# Patient Record
Sex: Female | Born: 1955 | Race: White | Hispanic: No | Marital: Married | Smoking: Current every day smoker
Health system: Southern US, Community
[De-identification: ages and names within clinical notes are randomized; demographics above are authoritative.]

## PROBLEM LIST (undated history)

## (undated) DIAGNOSIS — I350 Nonrheumatic aortic (valve) stenosis: Secondary | ICD-10-CM

## (undated) DIAGNOSIS — F329 Major depressive disorder, single episode, unspecified: Secondary | ICD-10-CM

## (undated) DIAGNOSIS — F32A Depression, unspecified: Secondary | ICD-10-CM

## (undated) DIAGNOSIS — N189 Chronic kidney disease, unspecified: Secondary | ICD-10-CM

## (undated) DIAGNOSIS — K9189 Other postprocedural complications and disorders of digestive system: Secondary | ICD-10-CM

## (undated) DIAGNOSIS — M1990 Unspecified osteoarthritis, unspecified site: Secondary | ICD-10-CM

## (undated) DIAGNOSIS — R011 Cardiac murmur, unspecified: Secondary | ICD-10-CM

## (undated) DIAGNOSIS — F988 Other specified behavioral and emotional disorders with onset usually occurring in childhood and adolescence: Secondary | ICD-10-CM

## (undated) DIAGNOSIS — F419 Anxiety disorder, unspecified: Secondary | ICD-10-CM

## (undated) DIAGNOSIS — T8859XA Other complications of anesthesia, initial encounter: Secondary | ICD-10-CM

## (undated) DIAGNOSIS — M810 Age-related osteoporosis without current pathological fracture: Secondary | ICD-10-CM

## (undated) DIAGNOSIS — D649 Anemia, unspecified: Secondary | ICD-10-CM

## (undated) DIAGNOSIS — G5600 Carpal tunnel syndrome, unspecified upper limb: Secondary | ICD-10-CM

## (undated) DIAGNOSIS — I10 Essential (primary) hypertension: Secondary | ICD-10-CM

## (undated) HISTORY — PX: TUBAL LIGATION: SHX77

## (undated) HISTORY — PX: APPENDECTOMY: SHX54

## (undated) HISTORY — PX: FRACTURE SURGERY: SHX138

## (undated) HISTORY — PX: LIGAMENT REPAIR: SHX5444

## (undated) HISTORY — PX: STRABISMUS SURGERY: SHX218

## (undated) HISTORY — PX: LAPAROSCOPIC CHOLECYSTECTOMY: SUR755

## (undated) HISTORY — PX: LAPAROSCOPIC OOPHERECTOMY: SHX6507

---

## 1999-04-09 HISTORY — PX: GASTRIC BYPASS: SHX52

## 2000-04-08 DIAGNOSIS — K9189 Other postprocedural complications and disorders of digestive system: Secondary | ICD-10-CM

## 2000-04-08 HISTORY — PX: ESOPHAGOGASTRODUODENOSCOPY (EGD) WITH ESOPHAGEAL DILATION: SHX5812

## 2000-04-08 HISTORY — DX: Other postprocedural complications and disorders of digestive system: K91.89

## 2000-12-12 ENCOUNTER — Encounter: Payer: Self-pay | Admitting: *Deleted

## 2000-12-12 ENCOUNTER — Encounter: Admission: RE | Admit: 2000-12-12 | Discharge: 2000-12-12 | Payer: Self-pay | Admitting: *Deleted

## 2001-01-02 ENCOUNTER — Ambulatory Visit (HOSPITAL_COMMUNITY): Admission: RE | Admit: 2001-01-02 | Discharge: 2001-01-02 | Payer: Self-pay | Admitting: Gastroenterology

## 2001-04-09 ENCOUNTER — Encounter: Payer: Self-pay | Admitting: *Deleted

## 2001-04-09 ENCOUNTER — Ambulatory Visit (HOSPITAL_COMMUNITY): Admission: RE | Admit: 2001-04-09 | Discharge: 2001-04-09 | Payer: Self-pay | Admitting: *Deleted

## 2001-10-23 ENCOUNTER — Encounter: Payer: Self-pay | Admitting: Emergency Medicine

## 2001-10-24 ENCOUNTER — Inpatient Hospital Stay (HOSPITAL_COMMUNITY): Admission: EM | Admit: 2001-10-24 | Discharge: 2001-10-24 | Payer: Self-pay | Admitting: Emergency Medicine

## 2001-11-06 ENCOUNTER — Encounter: Payer: Self-pay | Admitting: Gastroenterology

## 2001-11-06 ENCOUNTER — Encounter: Admission: RE | Admit: 2001-11-06 | Discharge: 2001-11-06 | Payer: Self-pay | Admitting: Gastroenterology

## 2001-12-03 ENCOUNTER — Encounter: Admission: RE | Admit: 2001-12-03 | Discharge: 2001-12-03 | Payer: Self-pay | Admitting: Family Medicine

## 2001-12-03 ENCOUNTER — Encounter: Payer: Self-pay | Admitting: Family Medicine

## 2002-09-07 ENCOUNTER — Encounter: Payer: Self-pay | Admitting: Specialist

## 2002-09-07 ENCOUNTER — Ambulatory Visit (HOSPITAL_COMMUNITY): Admission: RE | Admit: 2002-09-07 | Discharge: 2002-09-07 | Payer: Self-pay | Admitting: Specialist

## 2004-07-03 ENCOUNTER — Ambulatory Visit (HOSPITAL_COMMUNITY): Admission: RE | Admit: 2004-07-03 | Discharge: 2004-07-03 | Payer: Self-pay | Admitting: Family Medicine

## 2005-04-08 HISTORY — PX: ABDOMINAL HYSTERECTOMY: SHX81

## 2005-10-27 ENCOUNTER — Emergency Department (HOSPITAL_COMMUNITY): Admission: EM | Admit: 2005-10-27 | Discharge: 2005-10-27 | Payer: Self-pay | Admitting: Emergency Medicine

## 2005-12-25 ENCOUNTER — Encounter: Admission: RE | Admit: 2005-12-25 | Discharge: 2005-12-25 | Payer: Self-pay | Admitting: Family Medicine

## 2006-08-26 ENCOUNTER — Encounter: Admission: RE | Admit: 2006-08-26 | Discharge: 2006-08-26 | Payer: Self-pay | Admitting: Obstetrics and Gynecology

## 2008-05-12 ENCOUNTER — Other Ambulatory Visit: Admission: RE | Admit: 2008-05-12 | Discharge: 2008-05-12 | Payer: Self-pay | Admitting: Family Medicine

## 2008-06-16 ENCOUNTER — Encounter: Admission: RE | Admit: 2008-06-16 | Discharge: 2008-06-16 | Payer: Self-pay | Admitting: Family Medicine

## 2010-02-09 ENCOUNTER — Encounter: Admission: RE | Admit: 2010-02-09 | Discharge: 2010-02-09 | Payer: Self-pay | Admitting: Orthopedic Surgery

## 2010-02-21 ENCOUNTER — Encounter: Admission: RE | Admit: 2010-02-21 | Discharge: 2010-02-21 | Payer: Self-pay | Admitting: Orthopedic Surgery

## 2010-04-28 ENCOUNTER — Encounter: Payer: Self-pay | Admitting: Orthopedic Surgery

## 2010-08-24 NOTE — Procedures (Signed)
[place]  Patient:    Tiffany Mueller, Tiffany Mueller Visit Number: [id number] MRN: [id number]          Service Type: END Location: ENDO Attending Physician:  Orland Mustard Dictated by:   Llana Aliment. Randa Evens, M.D. Proc. Date: 01/02/01 Admit Date:  01/02/2001   CC:         Dr. Gladstone Pih  Dr. Adolphus Birchwood, [facility]   Procedure Report  PROCEDURE:  Esophagogastroduodenoscopy.  COLONOSCOPIST:  Llana Aliment. Randa Evens, M.D.  MEDICATIONS:  Hurricane spray, Fentanyl 50 mcg, and Versed 5 mg IV.  INDICATIONS:  The patient has had a gastric bypass procedure with a cholecystectomy and a Roux-en-Y for obesity and has lost nearly 100 pounds. Unfortunately since her surgery done on October 25 there has been vomiting. Upper GI showed a partial gastric bowel obstruction.  This procedure is done to evaluate that area.  DESCRIPTION OF PROCEDURE:  The procedure had been explained to the patient and consent obtained.  With the patient in the left lateral decubitus position, the Olympus scope was passed.  The distal esophagus was reached and the gastric pouch was entered.  There was some food material in the gastric pouch. The patient had been on liquids and he actually received some GoLYTELY type of material to try to loosen this up and there is only small amounts of solid material, mostly liquid, which is easily sucked out.  The gastric outlet was identified and easily passed with a scope.  It was slightly friable and a little bit swollen, but the endoscope easily passed with minimal pressure. The small intestine was seen for approximately 20 cm and was normal.  The scope was withdrawn and several passes were made through the gastric outlet. Scope was withdrawn and all food material and liquid in the stomach were suctioned.  She tolerated the procedure well and was awake and alert at the termination of procedure.  ASSESSMENT:  Slight swelling and friability to  gastric outlet, partial gastric outlet obstruction.  PLAN:  Will give a trial of Reglan and Carafate.  Will see her back in our office in six weeks.  If she has not improved, she may well need surgical intervention. Dictated by:   Llana Aliment. Randa Evens, M.D. Attending Physician:  Orland Mustard DD:  01/02/01 TD:  01/02/01 Job: [id number]

## 2012-10-07 ENCOUNTER — Other Ambulatory Visit: Payer: Self-pay | Admitting: Family Medicine

## 2012-10-07 DIAGNOSIS — M810 Age-related osteoporosis without current pathological fracture: Secondary | ICD-10-CM

## 2012-10-07 DIAGNOSIS — Z1231 Encounter for screening mammogram for malignant neoplasm of breast: Secondary | ICD-10-CM

## 2012-10-28 ENCOUNTER — Ambulatory Visit
Admission: RE | Admit: 2012-10-28 | Discharge: 2012-10-28 | Disposition: A | Discharge status: Home / Self Care | Payer: BC Managed Care – PPO | Source: Ambulatory Visit | Attending: Family Medicine | Admitting: Family Medicine

## 2012-10-28 DIAGNOSIS — M810 Age-related osteoporosis without current pathological fracture: Secondary | ICD-10-CM

## 2012-10-28 DIAGNOSIS — Z1231 Encounter for screening mammogram for malignant neoplasm of breast: Secondary | ICD-10-CM

## 2014-06-08 ENCOUNTER — Emergency Department (HOSPITAL_BASED_OUTPATIENT_CLINIC_OR_DEPARTMENT_OTHER)
Admission: EM | Admit: 2014-06-08 | Discharge: 2014-06-08 | Disposition: A | Discharge status: Home / Self Care | Payer: BLUE CROSS/BLUE SHIELD | Attending: Emergency Medicine | Admitting: Emergency Medicine

## 2014-06-08 ENCOUNTER — Emergency Department (HOSPITAL_BASED_OUTPATIENT_CLINIC_OR_DEPARTMENT_OTHER): Payer: BLUE CROSS/BLUE SHIELD

## 2014-06-08 ENCOUNTER — Encounter (HOSPITAL_BASED_OUTPATIENT_CLINIC_OR_DEPARTMENT_OTHER): Payer: Self-pay | Admitting: *Deleted

## 2014-06-08 DIAGNOSIS — M8580 Other specified disorders of bone density and structure, unspecified site: Secondary | ICD-10-CM | POA: Insufficient documentation

## 2014-06-08 DIAGNOSIS — M79641 Pain in right hand: Secondary | ICD-10-CM

## 2014-06-08 DIAGNOSIS — Z720 Tobacco use: Secondary | ICD-10-CM | POA: Diagnosis not present

## 2014-06-08 DIAGNOSIS — M19041 Primary osteoarthritis, right hand: Secondary | ICD-10-CM | POA: Diagnosis not present

## 2014-06-08 DIAGNOSIS — G5601 Carpal tunnel syndrome, right upper limb: Secondary | ICD-10-CM | POA: Diagnosis not present

## 2014-06-08 HISTORY — DX: Age-related osteoporosis without current pathological fracture: M81.0

## 2014-06-08 NOTE — ED Provider Notes (Signed)
CSN: [id number]     Arrival date & time 06/08/14  1347 History   First MD Initiated Contact with Patient 06/08/14 1440     Chief Complaint  Patient presents with  . Hand Pain     (Consider location/radiation/quality/duration/timing/severity/associated sxs/prior Treatment) HPI Comments: 59 year old female complaining of right hand pain 2 days. States she was gardening which started to get sharp, shooting pains through the top of her hand into her wrist with associated tingling into her fingers and up towards her elbow, worse with movement and use. No injury or trauma, just is constantly using her right hand. Reports a history of osteoporosis and is concerned that she may have a small fracture. States she had tendon surgery about 3 years ago in the right hand, and has many splints at home which provided no relief. Denies fever, chills, weakness.  Patient is a 59 y.o. female presenting with hand pain. The history is provided by the patient.  Hand Pain This is a recurrent problem. The current episode started yesterday. The problem occurs constantly. The problem has been gradually worsening. Associated symptoms comments: tingling.    Past Medical History  Diagnosis Date  . Osteoporosis    Past Surgical History  Procedure Laterality Date  . Wrist surgery    . Gastric bypass    . Appendectomy    . Eye surgery    . Abdominal hysterectomy     No family history on file. History  Substance Use Topics  . Smoking status: Current Every Day Smoker  . Smokeless tobacco: Not on file  . Alcohol Use: Yes   OB History    No data available     Review of Systems  Musculoskeletal:       + R hand pain.  All other systems reviewed and are negative.     Allergies  Review of patient's allergies indicates not on file.  Home Medications   Prior to Admission medications   Not on File   BP 170/70 mmHg  Pulse 88  Temp(Src) 98.1 F (36.7 C) (Oral)  Resp 16  Ht 5\' 2"  (1.575 m)  Wt 141 lb  (63.957 kg)  BMI 25.78 kg/m2  SpO2 100% Physical Exam  Constitutional: She is oriented to person, place, and time. She appears well-developed and well-nourished. No distress.  HENT:  Head: Normocephalic and atraumatic.  Mouth/Throat: Oropharynx is clear and moist.  Eyes: Conjunctivae and EOM are normal.  Neck: Normal range of motion. Neck supple.  Cardiovascular: Normal rate, regular rhythm and normal heart sounds.   Pulmonary/Chest: Effort normal and breath sounds normal. No respiratory distress.  Musculoskeletal: Normal range of motion. She exhibits no edema.  R hand TTP over 2-4 metacarpals. No deformity or swelling. TTP over dorsal aspect of carpal bones. No snuffbox tenderness. FROM wrist and hand, pain into hand with flexion.  Neurological: She is alert and oriented to person, place, and time. No sensory deficit.  Grip strength 5/5 and equal BL. Positive Tinel's and Phalen's on right.  Skin: Skin is warm and dry.  Psychiatric: She has a normal mood and affect. Her behavior is normal.  Nursing note and vitals reviewed.   ED Course  Procedures (including critical care time) Labs Review Labs Reviewed - No data to display  Imaging Review Dg Hand Complete Right  06/08/2014   CLINICAL DATA:  Pain in the proximal aspect of the right hand for a few days. No known injury. Initial encounter.  EXAM: RIGHT HAND - COMPLETE 3+ VIEW  COMPARISON:  MRI of the right wrist 02/21/2010.  FINDINGS: No acute bony or joint abnormality is identified. Cystic change in the ulnar aspect of the lunate in this patient with mild ulnar positive variance is compatible with ulnocarpal abutment. Joint space narrowing and osteophytosis are most notable at the DIP joints of the index and long fingers. No erosion is identified. Bones appear somewhat osteopenic.  IMPRESSION: No acute abnormality.  Findings compatible with ulnocarpal abutment.  Osteopenia.  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    Electronically Signed   By: Drusilla Kanner M.D.   On: 06/08/2014 15:24     EKG Interpretation None      MDM   Final diagnoses:  Right hand pain  Osteopenia  Osteoarthritis of right hand, unspecified osteoarthritis type  Carpal tunnel syndrome of right wrist   NAD. Neurovascularly intact. No acute findings on x-ray. Most consistent with carpal tunnel. Advised her to continue to use her wrist splints along with taking NSAIDs. Follow-up with orthopedics. She has seen Dr. Mina Marble in the past for her hand who did her tendon surgery, however she does not wish to return to his office. Stable for discharge. Return precautions given. Patient states understanding of treatment care plan and is agreeable.   Kathrynn Speed, PA-C 06/08/14 1535  Arby Barrette, MD 06/09/14 [phone]

## 2014-06-08 NOTE — ED Notes (Signed)
Pt reports pain in the back of her hand that goes into her wrist with some tingling since yesterday when she was working in the garden.

## 2014-06-08 NOTE — Discharge Instructions (Signed)
Use your wrist splints, and take ibuprofen, aleve or tylenol for pain.  Osteoarthritis Osteoarthritis is a disease that causes soreness and inflammation of a joint. It occurs when the cartilage at the affected joint wears down. Cartilage acts as a cushion, covering the ends of bones where they meet to form a joint. Osteoarthritis is the most common form of arthritis. It often occurs in older people. The joints affected most often by this condition include those in the:  Ends of the fingers.  Thumbs.  Neck.  Lower back.  Knees.  Hips. CAUSES  Over time, the cartilage that covers the ends of bones begins to wear away. This causes bone to rub on bone, producing pain and stiffness in the affected joints.  RISK FACTORS Certain factors can increase your chances of having osteoarthritis, including:  Older age.  Excessive body weight.  Overuse of joints.  Previous joint injury. SIGNS AND SYMPTOMS   Pain, swelling, and stiffness in the joint.  Over time, the joint may lose its normal shape.  Small deposits of bone (osteophytes) may grow on the edges of the joint.  Bits of bone or cartilage can break off and float inside the joint space. This may cause more pain and damage. DIAGNOSIS  Your health care provider will do a physical exam and ask about your symptoms. Various tests may be ordered, such as:  X-rays of the affected joint.  An MRI scan.  Blood tests to rule out other types of arthritis.  Joint fluid tests. This involves using a needle to draw fluid from the joint and examining the fluid under a microscope. TREATMENT  Goals of treatment are to control pain and improve joint function. Treatment plans may include:  A prescribed exercise program that allows for rest and joint relief.  A weight control plan.  Pain relief techniques, such as:  Properly applied heat and cold.  Electric pulses delivered to nerve endings under the skin (transcutaneous electrical nerve  stimulation [TENS]).  Massage.  Certain nutritional supplements.  Medicines to control pain, such as:  Acetaminophen.  Nonsteroidal anti-inflammatory drugs (NSAIDs), such as naproxen.  Narcotic or central-acting agents, such as tramadol.  Corticosteroids. These can be given orally or as an injection.  Surgery to reposition the bones and relieve pain (osteotomy) or to remove loose pieces of bone and cartilage. Joint replacement may be needed in advanced states of osteoarthritis. HOME CARE INSTRUCTIONS   Take medicines only as directed by your health care provider.  Maintain a healthy weight. Follow your health care provider's instructions for weight control. This may include dietary instructions.  Exercise as directed. Your health care provider can recommend specific types of exercise. These may include:  Strengthening exercises. These are done to strengthen the muscles that support joints affected by arthritis. They can be performed with weights or with exercise bands to add resistance.  Aerobic activities. These are exercises, such as brisk walking or low-impact aerobics, that get your heart pumping.  Range-of-motion activities. These keep your joints limber.  Balance and agility exercises. These help you maintain daily living skills.  Rest your affected joints as directed by your health care provider.  Keep all follow-up visits as directed by your health care provider. SEEK MEDICAL CARE IF:   Your skin turns red.  You develop a rash in addition to your joint pain.  You have worsening joint pain.  You have a fever along with joint or muscle aches. SEEK IMMEDIATE MEDICAL CARE IF:  You have a  significant loss of weight or appetite.  You have night sweats. FOR MORE INFORMATION   National Institute of Arthritis and Musculoskeletal and Skin Diseases: www.niams.http://www.myers.net/  [employer] on Aging: https://walker.com/  American College of Rheumatology:  www.rheumatology.org Document Released: 03/25/2005 Document Revised: 08/09/2013 Document Reviewed: 11/30/2012 [facility] Patient Information 2015 [place], [place]. This information is not intended to replace advice given to you by your health care provider. Make sure you discuss any questions you have with your health care provider.  Carpal Tunnel Syndrome The carpal tunnel is a narrow area located on the palm side of your wrist. The tunnel is formed by the wrist bones and ligaments. Nerves, blood vessels, and tendons pass through the carpal tunnel. Repeated wrist motion or certain diseases may cause swelling within the tunnel. This swelling pinches the main nerve in the wrist (median nerve) and causes the painful hand and arm condition called carpal tunnel syndrome. CAUSES   Repeated wrist motions.  Wrist injuries.  Certain diseases like arthritis, diabetes, alcoholism, hyperthyroidism, and kidney failure.  Obesity.  Pregnancy. SYMPTOMS   A "pins and needles" feeling in your fingers or hand, especially in your thumb, index and middle fingers.  Tingling or numbness in your fingers or hand.  An aching feeling in your entire arm, especially when your wrist and elbow are bent for long periods of time.  Wrist pain that goes up your arm to your shoulder.  Pain that goes down into your palm or fingers.  A weak feeling in your hands. DIAGNOSIS  Your health care provider will take your history and perform a physical exam. An electromyography test may be needed. This test measures electrical signals sent out by your nerves into the muscles. The electrical signals are usually slowed by carpal tunnel syndrome. You may also need X-rays. TREATMENT  Carpal tunnel syndrome may clear up by itself. Your health care provider may recommend a wrist splint or medicine such as a nonsteroidal anti-inflammatory medicine. Cortisone injections may help. Sometimes, surgery may be needed to free the pinched nerve.   HOME CARE INSTRUCTIONS   Take all medicine as directed by your health care provider. Only take over-the-counter or prescription medicines for pain, discomfort, or fever as directed by your health care provider.  If you were given a splint to keep your wrist from bending, wear it as directed. It is important to wear the splint at night. Wear the splint for as long as you have pain or numbness in your hand, arm, or wrist. This may take 1 to 2 months.  Rest your wrist from any activity that may be causing your pain. If your symptoms are work-related, you may need to talk to your employer about changing to a job that does not require using your wrist.  Put ice on your wrist after long periods of wrist activity.  Put ice in a plastic bag.  Place a towel between your skin and the bag.  Leave the ice on for 15-20 minutes, 03-04 times a day.  Keep all follow-up visits as directed by your health care provider. This includes any orthopedic referrals, physical therapy, and rehabilitation. Any delay in getting necessary care could result in a delay or failure of your condition to heal. SEEK IMMEDIATE MEDICAL CARE IF:   You have new, unexplained symptoms.  Your symptoms get worse and are not helped or controlled with medicines. MAKE SURE YOU:   Understand these instructions.  Will watch your condition.  Will get help right away if you  are not doing well or get worse. Document Released: 03/22/2000 Document Revised: 08/09/2013 Document Reviewed: 02/08/2011 [facility] Patient Information 2015 [place]. This information is not intended to replace advice given to you by your health care provider. Make sure you discuss any questions you have with your health care provider.

## 2014-06-08 NOTE — ED Notes (Signed)
PA at bedside.

## 2014-11-08 ENCOUNTER — Emergency Department (HOSPITAL_COMMUNITY): Payer: BLUE CROSS/BLUE SHIELD

## 2014-11-08 ENCOUNTER — Encounter (HOSPITAL_COMMUNITY): Payer: Self-pay | Admitting: Emergency Medicine

## 2014-11-08 ENCOUNTER — Inpatient Hospital Stay (HOSPITAL_COMMUNITY)
Admission: EM | Admit: 2014-11-08 | Discharge: 2014-11-12 | DRG: 392 | Disposition: A | Discharge status: Home / Self Care | Payer: BLUE CROSS/BLUE SHIELD | Attending: Family Medicine | Admitting: Family Medicine

## 2014-11-08 DIAGNOSIS — K529 Noninfective gastroenteritis and colitis, unspecified: Principal | ICD-10-CM | POA: Diagnosis present

## 2014-11-08 DIAGNOSIS — Z5320 Procedure and treatment not carried out because of patient's decision for unspecified reasons: Secondary | ICD-10-CM

## 2014-11-08 DIAGNOSIS — Z90710 Acquired absence of both cervix and uterus: Secondary | ICD-10-CM

## 2014-11-08 DIAGNOSIS — F17210 Nicotine dependence, cigarettes, uncomplicated: Secondary | ICD-10-CM | POA: Diagnosis present

## 2014-11-08 DIAGNOSIS — I10 Essential (primary) hypertension: Secondary | ICD-10-CM

## 2014-11-08 DIAGNOSIS — R109 Unspecified abdominal pain: Secondary | ICD-10-CM | POA: Diagnosis not present

## 2014-11-08 DIAGNOSIS — Z9049 Acquired absence of other specified parts of digestive tract: Secondary | ICD-10-CM | POA: Diagnosis present

## 2014-11-08 DIAGNOSIS — F329 Major depressive disorder, single episode, unspecified: Secondary | ICD-10-CM | POA: Diagnosis present

## 2014-11-08 DIAGNOSIS — K56609 Unspecified intestinal obstruction, unspecified as to partial versus complete obstruction: Secondary | ICD-10-CM

## 2014-11-08 DIAGNOSIS — M810 Age-related osteoporosis without current pathological fracture: Secondary | ICD-10-CM | POA: Diagnosis present

## 2014-11-08 DIAGNOSIS — Z9884 Bariatric surgery status: Secondary | ICD-10-CM

## 2014-11-08 HISTORY — DX: Other postprocedural complications and disorders of digestive system: K91.89

## 2014-11-08 HISTORY — DX: Depression, unspecified: F32.A

## 2014-11-08 HISTORY — DX: Major depressive disorder, single episode, unspecified: F32.9

## 2014-11-08 HISTORY — DX: Essential (primary) hypertension: I10

## 2014-11-08 HISTORY — DX: Other specified behavioral and emotional disorders with onset usually occurring in childhood and adolescence: F98.8

## 2014-11-08 LAB — COMPREHENSIVE METABOLIC PANEL
ALT: 16 U/L (ref 14–54)
AST: 20 U/L (ref 15–41)
Albumin: 4.5 g/dL (ref 3.5–5.0)
Alkaline Phosphatase: 97 U/L (ref 38–126)
Anion gap: 8 (ref 5–15)
BUN: 11 mg/dL (ref 6–20)
CALCIUM: 9.3 mg/dL (ref 8.9–10.3)
CO2: 25 mmol/L (ref 22–32)
CREATININE: 1 mg/dL (ref 0.44–1.00)
Chloride: 104 mmol/L (ref 101–111)
GFR calc Af Amer: >60 mL/min (ref >60)
GFR calc non Af Amer: 60 mL/min — ABNORMAL LOW (ref >60)
GLUCOSE: 114 mg/dL — AB (ref 65–99)
Potassium: 4 mmol/L (ref 3.5–5.1)
Sodium: 137 mmol/L (ref 135–145)
TOTAL PROTEIN: 7.5 g/dL (ref 6.5–8.1)
Total Bilirubin: 1.2 mg/dL (ref 0.3–1.2)

## 2014-11-08 LAB — CBC WITH DIFFERENTIAL/PLATELET
BASOS PCT: 0 % (ref 0–1)
Basophils Absolute: 0 10*3/uL (ref 0.0–0.1)
EOS ABS: 0.1 10*3/uL (ref 0.0–0.7)
Eosinophils Relative: 1 % (ref 0–5)
HCT: 35.8 % — ABNORMAL LOW (ref 36.0–46.0)
HEMOGLOBIN: 10.7 g/dL — AB (ref 12.0–15.0)
LYMPHS ABS: 0.8 10*3/uL (ref 0.7–4.0)
Lymphocytes Relative: 9 % — ABNORMAL LOW (ref 12–46)
MCH: 23.9 pg — ABNORMAL LOW (ref 26.0–34.0)
MCHC: 29.9 g/dL — ABNORMAL LOW (ref 30.0–36.0)
MCV: 79.9 fL (ref 78.0–100.0)
MONO ABS: 0.5 10*3/uL (ref 0.1–1.0)
Monocytes Relative: 5 % (ref 3–12)
Neutro Abs: 7.6 10*3/uL (ref 1.7–7.7)
Neutrophils Relative %: 85 % — ABNORMAL HIGH (ref 43–77)
Platelets: 378 10*3/uL (ref 150–400)
RBC: 4.48 MIL/uL (ref 3.87–5.11)
RDW: 16.5 % — ABNORMAL HIGH (ref 11.5–15.5)
WBC: 9 10*3/uL (ref 4.0–10.5)

## 2014-11-08 LAB — LIPASE, BLOOD: Lipase: 14 U/L — ABNORMAL LOW (ref 22–51)

## 2014-11-08 LAB — I-STAT CG4 LACTIC ACID, ED: LACTIC ACID, VENOUS: 0.51 mmol/L (ref 0.5–2.0)

## 2014-11-08 MED ORDER — HYDROMORPHONE HCL 1 MG/ML IJ SOLN
1.0000 mg | INTRAMUSCULAR | Status: DC | PRN
  Administered 2014-11-08 – 2014-11-10 (×4): 1 mg via INTRAVENOUS
  Filled 2014-11-08 (×4): qty 1

## 2014-11-08 MED ORDER — LACTATED RINGERS IV BOLUS (SEPSIS): 1000.0000 mL | Freq: Once | INTRAVENOUS | Status: DC

## 2014-11-08 MED ORDER — ONDANSETRON HCL 40 MG/20ML IJ SOLN
8.0000 mg | Freq: Four times a day (QID) | INTRAMUSCULAR | Status: DC | PRN
  Filled 2014-11-08: qty 4

## 2014-11-08 MED ORDER — AMLODIPINE BESYLATE 5 MG PO TABS
5.0000 mg | ORAL_TABLET | Freq: Every day | ORAL | Status: DC
  Administered 2014-11-08 – 2014-11-12 (×5): 5 mg via ORAL
  Filled 2014-11-08 (×5): qty 1

## 2014-11-08 MED ORDER — ONDANSETRON HCL 4 MG/2ML IJ SOLN
4.0000 mg | Freq: Once | INTRAMUSCULAR | Status: AC
  Administered 2014-11-08: 4 mg via INTRAVENOUS
  Filled 2014-11-08: qty 2

## 2014-11-08 MED ORDER — ENOXAPARIN SODIUM 40 MG/0.4ML ~~LOC~~ SOLN
40.0000 mg | SUBCUTANEOUS | Status: DC
  Administered 2014-11-08 – 2014-11-11 (×4): 40 mg via SUBCUTANEOUS
  Filled 2014-11-08 (×4): qty 0.4

## 2014-11-08 MED ORDER — LIP MEDEX EX OINT
1.0000 "application " | TOPICAL_OINTMENT | Freq: Two times a day (BID) | CUTANEOUS | Status: DC
  Administered 2014-11-08 – 2014-11-12 (×8): 1 via TOPICAL
  Filled 2014-11-08: qty 7

## 2014-11-08 MED ORDER — ONDANSETRON HCL 4 MG/2ML IJ SOLN: 4.0000 mg | Freq: Three times a day (TID) | INTRAMUSCULAR | Status: DC | PRN

## 2014-11-08 MED ORDER — BISACODYL 10 MG RE SUPP: 10.0000 mg | Freq: Two times a day (BID) | RECTAL | Status: DC | PRN

## 2014-11-08 MED ORDER — ACETAMINOPHEN 650 MG RE SUPP: 650.0000 mg | Freq: Four times a day (QID) | RECTAL | Status: DC | PRN

## 2014-11-08 MED ORDER — VENLAFAXINE HCL ER 150 MG PO CP24
150.0000 mg | ORAL_CAPSULE | Freq: Every day | ORAL | Status: DC
Start: 2014-11-09 — End: 2014-11-12
  Administered 2014-11-09 – 2014-11-12 (×4): 150 mg via ORAL
  Filled 2014-11-08 (×4): qty 1

## 2014-11-08 MED ORDER — BUPROPION HCL ER (XL) 150 MG PO TB24
150.0000 mg | ORAL_TABLET | Freq: Every day | ORAL | Status: DC
  Administered 2014-11-08 – 2014-11-12 (×5): 150 mg via ORAL
  Filled 2014-11-08 (×5): qty 1

## 2014-11-08 MED ORDER — SODIUM CHLORIDE 0.9 % IJ SOLN
3.0000 mL | Freq: Two times a day (BID) | INTRAMUSCULAR | Status: DC
  Administered 2014-11-08 – 2014-11-12 (×6): 3 mL via INTRAVENOUS

## 2014-11-08 MED ORDER — SACCHAROMYCES BOULARDII 250 MG PO CAPS
250.0000 mg | ORAL_CAPSULE | Freq: Two times a day (BID) | ORAL | Status: DC
  Administered 2014-11-08 – 2014-11-12 (×8): 250 mg via ORAL
  Filled 2014-11-08 (×8): qty 1

## 2014-11-08 MED ORDER — PHENOL 1.4 % MT LIQD
2.0000 | OROMUCOSAL | Status: DC | PRN
  Filled 2014-11-08: qty 177

## 2014-11-08 MED ORDER — IOHEXOL 300 MG/ML  SOLN
25.0000 mL | Freq: Once | INTRAMUSCULAR | Status: AC | PRN
  Administered 2014-11-08: 25 mL via ORAL

## 2014-11-08 MED ORDER — METRONIDAZOLE IN NACL 5-0.79 MG/ML-% IV SOLN
500.0000 mg | Freq: Three times a day (TID) | INTRAVENOUS | Status: DC
  Administered 2014-11-08 – 2014-11-11 (×8): 500 mg via INTRAVENOUS
  Filled 2014-11-08 (×8): qty 100

## 2014-11-08 MED ORDER — MORPHINE SULFATE 4 MG/ML IJ SOLN
4.0000 mg | Freq: Once | INTRAMUSCULAR | Status: AC
  Administered 2014-11-08: 4 mg via INTRAVENOUS
  Filled 2014-11-08: qty 1

## 2014-11-08 MED ORDER — METOPROLOL TARTRATE 1 MG/ML IV SOLN: 5.0000 mg | Freq: Four times a day (QID) | INTRAVENOUS | Status: DC | PRN

## 2014-11-08 MED ORDER — SODIUM CHLORIDE 0.9 % IV SOLN
INTRAVENOUS | Status: AC
  Administered 2014-11-08 – 2014-11-09 (×2): via INTRAVENOUS

## 2014-11-08 MED ORDER — MENTHOL 3 MG MT LOZG
1.0000 | LOZENGE | OROMUCOSAL | Status: DC | PRN
  Filled 2014-11-08: qty 9

## 2014-11-08 MED ORDER — IOHEXOL 300 MG/ML  SOLN
100.0000 mL | Freq: Once | INTRAMUSCULAR | Status: AC | PRN
  Administered 2014-11-08: 100 mL via INTRAVENOUS

## 2014-11-08 MED ORDER — LACTATED RINGERS IV BOLUS (SEPSIS): 1000.0000 mL | Freq: Three times a day (TID) | INTRAVENOUS | Status: DC | PRN

## 2014-11-08 MED ORDER — MAGIC MOUTHWASH
15.0000 mL | Freq: Four times a day (QID) | ORAL | Status: DC | PRN
  Filled 2014-11-08: qty 15

## 2014-11-08 MED ORDER — FUROSEMIDE 40 MG PO TABS
40.0000 mg | ORAL_TABLET | Freq: Two times a day (BID) | ORAL | Status: DC
  Administered 2014-11-09 – 2014-11-12 (×6): 40 mg via ORAL
  Filled 2014-11-08 (×7): qty 1

## 2014-11-08 MED ORDER — PROMETHAZINE HCL 25 MG/ML IJ SOLN
6.2500 mg | INTRAMUSCULAR | Status: DC | PRN
  Filled 2014-11-08: qty 1

## 2014-11-08 MED ORDER — DIPHENHYDRAMINE HCL 50 MG/ML IJ SOLN: 12.5000 mg | Freq: Four times a day (QID) | INTRAMUSCULAR | Status: DC | PRN

## 2014-11-08 MED ORDER — ALUM & MAG HYDROXIDE-SIMETH 200-200-20 MG/5ML PO SUSP: 30.0000 mL | Freq: Four times a day (QID) | ORAL | Status: DC | PRN

## 2014-11-08 MED ORDER — LACTATED RINGERS IV BOLUS (SEPSIS)
2000.0000 mL | Freq: Once | INTRAVENOUS | Status: AC
  Administered 2014-11-08: 2000 mL via INTRAVENOUS

## 2014-11-08 MED ORDER — CIPROFLOXACIN IN D5W 400 MG/200ML IV SOLN
400.0000 mg | Freq: Two times a day (BID) | INTRAVENOUS | Status: DC
  Administered 2014-11-08 – 2014-11-10 (×5): 400 mg via INTRAVENOUS
  Filled 2014-11-08 (×6): qty 200

## 2014-11-08 MED ORDER — HYDRALAZINE HCL 20 MG/ML IJ SOLN: 10.0000 mg | Freq: Four times a day (QID) | INTRAMUSCULAR | Status: DC | PRN

## 2014-11-08 MED ORDER — ONDANSETRON HCL 4 MG/2ML IJ SOLN
4.0000 mg | Freq: Four times a day (QID) | INTRAMUSCULAR | Status: DC | PRN
Start: 2014-11-08 — End: 2014-11-12

## 2014-11-08 MED ORDER — SODIUM CHLORIDE 0.9 % IV SOLN: INTRAVENOUS | Status: AC

## 2014-11-08 MED ORDER — ACETAMINOPHEN 325 MG PO TABS
650.0000 mg | ORAL_TABLET | Freq: Four times a day (QID) | ORAL | Status: DC | PRN
  Administered 2014-11-10: 650 mg via ORAL
  Filled 2014-11-08: qty 2

## 2014-11-08 MED ORDER — KCL IN DEXTROSE-NACL 40-5-0.45 MEQ/L-%-% IV SOLN
INTRAVENOUS | Status: DC
  Filled 2014-11-08 (×2): qty 1000

## 2014-11-08 NOTE — ED Notes (Signed)
Spoke to [place] pt can go at 19:30

## 2014-11-08 NOTE — ED Provider Notes (Signed)
CSN: [id number]     Arrival date & time 11/08/14  1512 History   First MD Initiated Contact with Patient 11/08/14 1522     Chief Complaint  Patient presents with  . Abdominal Pain  . Bloated     (Consider location/radiation/quality/duration/timing/severity/associated sxs/prior Treatment) HPI Tiffany Mueller is a 59 y.o. female with history of hypertension presents to emergency department complaining of abdominal pain, nausea, vomiting. Patient states her symptoms started 7 PM last night. States pain is mainly in the right lower quadrant but radiates around the entire abdomen. She reports approximately 4-5 episodes of emesis. She states she had a normal bowel movement this morning. She denies any blood in her stool or emesis. She denies any black or tarry stools. She denies constipation. She states her abdomen feels bloated. She has been able to keep water down but has not eaten. Patient reports history of gastric bypass, surgery for "scar tissue and abdomen," appendectomy. She took tums prior to coming in today.  She denies any fever or chills. Denies any urinary symptoms. She has history of hysterectomy, states they took out one of the ovaries and uterus, states they "could not find my second ovary, floating somewhere and abdomen."  Pt states nothing making her symptoms better or worse. Denies similar pain in the past.  Past Medical History  Diagnosis Date  . Osteoporosis   . Hypertension   . Depression   . ADD (attention deficit disorder)    Past Surgical History  Procedure Laterality Date  . Wrist surgery    . Gastric bypass    . Appendectomy    . Eye surgery    . Abdominal hysterectomy     Family History  Problem Relation Age of Onset  . Alzheimer's disease Mother   . Atrial fibrillation Father    History  Substance Use Topics  . Smoking status: Current Every Day Smoker -- 0.50 packs/day    Types: Cigarettes  . Smokeless tobacco: Not on file  . Alcohol Use: Yes     Comment:  1 glass wine nightly   OB History    No data available     Review of Systems  Constitutional: Negative for fever and chills.  Respiratory: Negative for cough, chest tightness and shortness of breath.   Cardiovascular: Negative for chest pain, palpitations and leg swelling.  Gastrointestinal: Positive for nausea, vomiting, abdominal pain and abdominal distention. Negative for diarrhea, constipation and blood in stool.  Genitourinary: Negative for dysuria, flank pain, vaginal bleeding, vaginal discharge, vaginal pain and pelvic pain.  Musculoskeletal: Negative for myalgias, arthralgias, neck pain and neck stiffness.  Skin: Negative for rash.  Neurological: Negative for dizziness, weakness and headaches.  All other systems reviewed and are negative.     Allergies  Review of patient's allergies indicates no known allergies.  Home Medications   Prior to Admission medications   Medication Sig Start Date End Date Taking? Authorizing Provider  amLODipine (NORVASC) 5 MG tablet Take 5 mg by mouth daily.   Yes Historical Provider, MD  amphetamine-dextroamphetamine (ADDERALL) 30 MG tablet Take 30 mg by mouth 2 (two) times daily.   Yes Historical Provider, MD  buPROPion (WELLBUTRIN XL) 150 MG 24 hr tablet TAKE 1 TABLET IN THE MORNING ONCE A DAY ORALLY 30 DAY(S) 10/26/14  Yes Historical Provider, MD  furosemide (LASIX) 40 MG tablet Take 40 mg by mouth 2 (two) times daily.   Yes Historical Provider, MD  naproxen sodium (ANAPROX) 220 MG tablet Take 440 mg  by mouth daily as needed (pain).   Yes Historical Provider, MD  venlafaxine XR (EFFEXOR-XR) 150 MG 24 hr capsule Take 150 mg by mouth daily with breakfast.   Yes Historical Provider, MD   BP 202/83 mmHg  Pulse 63  Temp(Src) 98 F (36.7 C) (Oral)  Resp 16  SpO2 100% Physical Exam  Constitutional: She is oriented to person, place, and time. She appears well-developed and well-nourished.  Appears to be in pain  HENT:  Head: Normocephalic.   Eyes: Conjunctivae are normal.  Neck: Neck supple.  Cardiovascular: Normal rate, regular rhythm and normal heart sounds.   Pulmonary/Chest: Effort normal and breath sounds normal. No respiratory distress. She has no wheezes. She has no rales.  Abdominal: Soft. Bowel sounds are normal. She exhibits no distension. There is tenderness. There is guarding. There is no rebound.  Diffuse tenderness, worse in RLQ  Musculoskeletal: She exhibits no edema.  Neurological: She is alert and oriented to person, place, and time.  Skin: Skin is warm and dry.  Psychiatric: She has a normal mood and affect. Her behavior is normal.  Nursing note and vitals reviewed.   ED Course  Procedures (including critical care time) Labs Review Labs Reviewed  CBC WITH DIFFERENTIAL/PLATELET - Abnormal; Notable for the following:    Hemoglobin 10.7 (*)    HCT 35.8 (*)    MCH 23.9 (*)    MCHC 29.9 (*)    RDW 16.5 (*)    Neutrophils Relative % 85 (*)    Lymphocytes Relative 9 (*)    All other components within normal limits  COMPREHENSIVE METABOLIC PANEL - Abnormal; Notable for the following:    Glucose, Bld 114 (*)    GFR calc non Af Amer 60 (*)    All other components within normal limits  LIPASE, BLOOD - Abnormal; Notable for the following:    Lipase 14 (*)    All other components within normal limits  URINALYSIS, ROUTINE W REFLEX MICROSCOPIC (NOT AT [facility])  COMPREHENSIVE METABOLIC PANEL  CBC  I-STAT CG4 LACTIC ACID, ED  I-STAT CG4 LACTIC ACID, ED    Imaging Review Ct Abdomen Pelvis W Contrast  11/08/2014   CLINICAL DATA:  Acute onset of lower abdominal pain, nausea and vomiting. Initial encounter.  EXAM: CT ABDOMEN AND PELVIS WITH CONTRAST  TECHNIQUE: Multidetector CT imaging of the abdomen and pelvis was performed using the standard protocol following bolus administration of intravenous contrast.  CONTRAST:  OMNIPAQUE IOHEXOL 300 MG/ML  SOLN  COMPARISON:  None.  FINDINGS: The visualized lung bases  are clear.  Trace ascites is noted surrounding the liver and spleen, and tracking along the paracolic gutters into the pelvis. The etiology of this fluid is unclear, but may be related to the small bowel process described below.  Diffuse prominence of the intrahepatic biliary ducts, and dilatation of the common bile duct to 1.9 cm, reflects prior cholecystectomy. This may simply reflect the patient's baseline status post cholecystectomy, though would correlate for any evidence of post-cholecystectomy syndrome.  The liver and spleen are otherwise unremarkable. The pancreas and adrenal glands are within normal limits.  Apparent parenchymal striations within both kidneys are nonspecific and may reflect mild scarring, though would correlate clinically for any evidence of mild pyelonephritis. There is no evidence of hydronephrosis. No renal or ureteral stones are seen. No perinephric stranding is appreciated.  There is distention of small-bowel loops to 4.1 cm in maximal diameter, with underlying postoperative change. There is mild fecalization of the  distal ileum, though it remains borderline normal in caliber. Wall thickening is noted at the terminal ileum, with mild soft tissue inflammation about the ileocecal junction. This may reflect infectious or inflammatory ileitis with small bowel dysmotility, or possibly partial obstruction due to underlying adhesion, given the patient's prior surgery.  The stomach is within normal limits. No acute vascular abnormalities are seen. Mild scattered calcification is noted along the abdominal aorta and its branches.  The appendix is not definitely seen. There is no evidence for appendicitis. The colon is partially filled with stool and is unremarkable in appearance, aside from the mild apparent soft tissue inflammation about the ileocecal junction and cecum.  The bladder is mildly distended and grossly unremarkable. The patient is status post hysterectomy. A 2.3 cm cyst is noted at  the left side of the vaginal cuff. No suspicious adnexal masses are seen. No inguinal lymphadenopathy is seen.  No acute osseous abnormalities are identified. Mild facet disease is noted at the lower lumbar spine.  IMPRESSION: 1. Distention of small-bowel loops to 4.1 cm in maximal diameter, with underlying postoperative change. Mild fecalization of the distal ileum, with wall thickening at the terminal ileum and mild soft tissue inflammation about the ileocecal junction and cecum. This may reflect an infectious or inflammatory ileitis, with small bowel dysmotility, or possibly partial obstruction due to an underlying adhesion, given the patient's prior surgery. 2. Trace ascites noted within the abdomen and pelvis. 3. Diffuse prominence of the intrahepatic biliary ducts, and dilatation of the common bile duct to 1.9 cm, reflecting prior cholecystectomy. This may simply reflect the patient's baseline status post cholecystectomy, though would correlate clinically for any evidence of post-cholecystectomy syndrome. 4. Apparent parenchymal striations within both kidneys are nonspecific and may reflect mild scarring, though would correlate clinically for any evidence of mild pyelonephritis. 5. Mild scattered calcification along the abdominal aorta and its branches. 6. 2.3 cm cyst noted at the left side of the vaginal cuff.   Electronically Signed   By: Roanna Raider M.D.   On: 11/08/2014 18:10   Dg Abd 2 Views  11/08/2014   CLINICAL DATA:  Abdominal pain and nausea for 2 days. History of gastric bypass surgery.  EXAM: ABDOMEN - 2 VIEW  COMPARISON:  None.  FINDINGS: There is some scattered air and stool in the transverse colon. There are slightly dilated small bowel loops with air-fluid levels in the mid to lower abdomen and left lower quadrant suspicious for early small bowel obstruction. No free air. The soft tissue shadows are maintained. Extensive surgical changes. The lung bases are clear. The bony structures are  intact.  IMPRESSION: Plain film findings suggest an early or partial small bowel obstruction.   Electronically Signed   By: Rudie Meyer M.D.   On: 11/08/2014 16:33     EKG Interpretation None      MDM   Final diagnoses:  Abdominal pain  Ileitis   Pt with prior gastric bypass, appendectomy, hysterectomy, here with diffuse abdominal pain, worse in RLQ, nausea, vomiting. Will start meds, labs, xray. Concerning for SBO given distention, pain, vomiting, however pt has normal BS and normal bowel movement today. Will monitor.   Plain x-rays concerning for small bowel obstruction. CT scan ordered.  Patient is a labs are unremarkable except for hemoglobin of 10.7, most likely chronic. CT scan showing distention of the small bowel loops, with wall thickening in the terminal ileum and ileocecal junction and cecum. This is concerning for ileitis. However possible partial  obstruction was not completely ruled out with the scan. I discussed results with Dr. Michaell Cowing, who advised me to admit patient to medicine. Patient has never had colitis or any similar pain in the past. She denies ever having colonoscopy. Will hold off on antibiotics, this time no elevation of white blood cell count. Patient is afebrile. Will discuss with medicine.  I spoke with the [facility] hospitalist, they will admit. I did tell him I did not start antibiotics.   Filed Vitals:   11/08/14 1516 11/08/14 1922 11/08/14 1948  BP: 202/83 162/59 180/73  Pulse: 63 68 67  Temp: 98 F (36.7 C) 98.5 F (36.9 C) 98.9 F (37.2 C)  TempSrc: Oral Oral Oral  Resp: 16 18 18   Height:   5\' 2"  (1.575 m)  Weight:   147 lb 3.2 oz (66.769 kg)  SpO2: 100% 100%        Jaynie Crumble, PA-C 11/09/14 0001  Melene Plan, DO 11/09/14 0030

## 2014-11-08 NOTE — H&P (Addendum)
Tiffany Mueller is an 59 y.o. female.    Dr. Maceo Pro (pcp) [place]    Chief Complaint:    HPI: 59 yo female with hx of htn, c/o cramping in bilateral lower abdomen starting yesterday after eating some soup.  Pt states that pain was constant.  Pt initially thought it was gas. + n/v (bile),   Denies fever, chills, diarrhea, constipation, brbpr, black stool.   Pt tried xanax without relief.   Pt didn't eat, so not sure if food made better or worse.  Pt has had multiple abdominal surgeries in the past.  CT scan showed ? Ileitis vs ? Partial SBO.  Wbc 9.0 (wnl), and ED contacted surgery who requested medical admission.     Past Medical History  Diagnosis Date  . Osteoporosis   . Hypertension   . Depression   . ADD (attention deficit disorder)   . Anastomotic stricture of gastrojejunostomy 2002    EGD dilation    Past Surgical History  Procedure Laterality Date  . Wrist surgery    . Gastric bypass  2001    [facility]  . Appendectomy    . Eye surgery      x4  . Abdominal hysterectomy  2007  . Laparoscopic oopherectomy    . Esophagogastroduodenoscopy  2002    Dr Oletta Lamas.  GJ stricture  . Cholecystectomy      Family History  Problem Relation Age of Onset  . Alzheimer's disease Mother   . Atrial fibrillation Father    Social History:  reports that she has been smoking Cigarettes.  She has a 20 pack-year smoking history. She does not have any smokeless tobacco history on file. She reports that she drinks about 4.2 oz of alcohol per week. She reports that she does not use illicit drugs.  Allergies:  Allergies  Allergen Reactions  . Cephalosporins Itching   Medications reviewed   Results for orders placed or performed during the hospital encounter of 11/08/14 (from the past 48 hour(s))  CBC with Differential     Status: Abnormal   Collection Time: 11/08/14  4:06 PM  Result Value Ref Range   WBC 9.0 4.0 - 10.5 K/uL   RBC 4.48 3.87 - 5.11 MIL/uL   Hemoglobin 10.7 (L) 12.0 - 15.0  g/dL   HCT 35.8 (L) 36.0 - 46.0 %   MCV 79.9 78.0 - 100.0 fL   MCH 23.9 (L) 26.0 - 34.0 pg   MCHC 29.9 (L) 30.0 - 36.0 g/dL   RDW 16.5 (H) 11.5 - 15.5 %   Platelets 378 150 - 400 K/uL   Neutrophils Relative % 85 (H) 43 - 77 %   Neutro Abs 7.6 1.7 - 7.7 K/uL   Lymphocytes Relative 9 (L) 12 - 46 %   Lymphs Abs 0.8 0.7 - 4.0 K/uL   Monocytes Relative 5 3 - 12 %   Monocytes Absolute 0.5 0.1 - 1.0 K/uL   Eosinophils Relative 1 0 - 5 %   Eosinophils Absolute 0.1 0.0 - 0.7 K/uL   Basophils Relative 0 0 - 1 %   Basophils Absolute 0.0 0.0 - 0.1 K/uL  Comprehensive metabolic panel     Status: Abnormal   Collection Time: 11/08/14  4:06 PM  Result Value Ref Range   Sodium 137 135 - 145 mmol/L   Potassium 4.0 3.5 - 5.1 mmol/L   Chloride 104 101 - 111 mmol/L   CO2 25 22 - 32 mmol/L   Glucose, Bld 114 (H) 65 -  99 mg/dL   BUN 11 6 - 20 mg/dL   Creatinine, Ser 1.00 0.44 - 1.00 mg/dL   Calcium 9.3 8.9 - 10.3 mg/dL   Total Protein 7.5 6.5 - 8.1 g/dL   Albumin 4.5 3.5 - 5.0 g/dL   AST 20 15 - 41 U/L   ALT 16 14 - 54 U/L   Alkaline Phosphatase 97 38 - 126 U/L   Total Bilirubin 1.2 0.3 - 1.2 mg/dL   GFR calc non Af Amer 60 (L) >60 mL/min   GFR calc Af Amer >60 >60 mL/min    Comment: (NOTE) The eGFR has been calculated using the CKD EPI equation. This calculation has not been validated in all clinical situations. eGFR's persistently <60 mL/min signify possible Chronic Kidney Disease.    Anion gap 8 5 - 15  Lipase, blood     Status: Abnormal   Collection Time: 11/08/14  4:06 PM  Result Value Ref Range   Lipase 14 (L) 22 - 51 U/L  I-Stat CG4 Lactic Acid, ED     Status: None   Collection Time: 11/08/14  4:07 PM  Result Value Ref Range   Lactic Acid, Venous 0.51 0.5 - 2.0 mmol/L   Ct Abdomen Pelvis W Contrast  11/08/2014   CLINICAL DATA:  Acute onset of lower abdominal pain, nausea and vomiting. Initial encounter.  EXAM: CT ABDOMEN AND PELVIS WITH CONTRAST  TECHNIQUE: Multidetector CT  imaging of the abdomen and pelvis was performed using the standard protocol following bolus administration of intravenous contrast.  CONTRAST:  127m OMNIPAQUE IOHEXOL 300 MG/ML  SOLN  COMPARISON:  None.  FINDINGS: The visualized lung bases are clear.  Trace ascites is noted surrounding the liver and spleen, and tracking along the paracolic gutters into the pelvis. The etiology of this fluid is unclear, but may be related to the small bowel process described below.  Diffuse prominence of the intrahepatic biliary ducts, and dilatation of the common bile duct to 1.9 cm, reflects prior cholecystectomy. This may simply reflect the patient's baseline status post cholecystectomy, though would correlate for any evidence of post-cholecystectomy syndrome.  The liver and spleen are otherwise unremarkable. The pancreas and adrenal glands are within normal limits.  Apparent parenchymal striations within both kidneys are nonspecific and may reflect mild scarring, though would correlate clinically for any evidence of mild pyelonephritis. There is no evidence of hydronephrosis. No renal or ureteral stones are seen. No perinephric stranding is appreciated.  There is distention of small-bowel loops to 4.1 cm in maximal diameter, with underlying postoperative change. There is mild fecalization of the distal ileum, though it remains borderline normal in caliber. Wall thickening is noted at the terminal ileum, with mild soft tissue inflammation about the ileocecal junction. This may reflect infectious or inflammatory ileitis with small bowel dysmotility, or possibly partial obstruction due to underlying adhesion, given the patient's prior surgery.  The stomach is within normal limits. No acute vascular abnormalities are seen. Mild scattered calcification is noted along the abdominal aorta and its branches.  The appendix is not definitely seen. There is no evidence for appendicitis. The colon is partially filled with stool and is  unremarkable in appearance, aside from the mild apparent soft tissue inflammation about the ileocecal junction and cecum.  The bladder is mildly distended and grossly unremarkable. The patient is status post hysterectomy. A 2.3 cm cyst is noted at the left side of the vaginal cuff. No suspicious adnexal masses are seen. No inguinal lymphadenopathy is seen.  No acute osseous abnormalities are identified. Mild facet disease is noted at the lower lumbar spine.  IMPRESSION: 1. Distention of small-bowel loops to 4.1 cm in maximal diameter, with underlying postoperative change. Mild fecalization of the distal ileum, with wall thickening at the terminal ileum and mild soft tissue inflammation about the ileocecal junction and cecum. This may reflect an infectious or inflammatory ileitis, with small bowel dysmotility, or possibly partial obstruction due to an underlying adhesion, given the patient's prior surgery. 2. Trace ascites noted within the abdomen and pelvis. 3. Diffuse prominence of the intrahepatic biliary ducts, and dilatation of the common bile duct to 1.9 cm, reflecting prior cholecystectomy. This may simply reflect the patient's baseline status post cholecystectomy, though would correlate clinically for any evidence of post-cholecystectomy syndrome. 4. Apparent parenchymal striations within both kidneys are nonspecific and may reflect mild scarring, though would correlate clinically for any evidence of mild pyelonephritis. 5. Mild scattered calcification along the abdominal aorta and its branches. 6. 2.3 cm cyst noted at the left side of the vaginal cuff.   Electronically Signed   By: Garald Balding M.D.   On: 11/08/2014 18:10   Dg Abd 2 Views  11/08/2014   CLINICAL DATA:  Abdominal pain and nausea for 2 days. History of gastric bypass surgery.  EXAM: ABDOMEN - 2 VIEW  COMPARISON:  None.  FINDINGS: There is some scattered air and stool in the transverse colon. There are slightly dilated small bowel loops with  air-fluid levels in the mid to lower abdomen and left lower quadrant suspicious for early small bowel obstruction. No free air. The soft tissue shadows are maintained. Extensive surgical changes. The lung bases are clear. The bony structures are intact.  IMPRESSION: Plain film findings suggest an early or partial small bowel obstruction.   Electronically Signed   By: Marijo Sanes M.D.   On: 11/08/2014 16:33    Review of Systems  Constitutional: Negative.   HENT: Negative.   Eyes: Negative.   Respiratory: Negative.   Cardiovascular: Negative.   Gastrointestinal: Positive for nausea, vomiting and abdominal pain. Negative for diarrhea, constipation, blood in stool and melena.  Genitourinary: Negative.   Musculoskeletal: Negative.   Skin: Negative.   Neurological: Negative.   Endo/Heme/Allergies: Negative.   Psychiatric/Behavioral: Negative.     Blood pressure 202/83, pulse 63, temperature 98 F (36.7 C), temperature source Oral, resp. rate 16, SpO2 100 %. Physical Exam  Constitutional: She is oriented to person, place, and time. She appears well-developed and well-nourished.  HENT:  Head: Normocephalic and atraumatic.  Mouth/Throat: No oropharyngeal exudate.  Eyes: Conjunctivae and EOM are normal. Pupils are equal, round, and reactive to light. No scleral icterus.  Neck: Normal range of motion. Neck supple. No JVD present. No tracheal deviation present. No thyromegaly present.  Cardiovascular: Normal rate and regular rhythm.  Exam reveals no gallop and no friction rub.   Murmur heard. Respiratory: Effort normal and breath sounds normal. No respiratory distress. She has no wheezes. She has no rales. She exhibits no tenderness.  GI: Soft. Bowel sounds are normal. She exhibits no distension. There is no tenderness. There is no rebound and no guarding.  Musculoskeletal: Normal range of motion. She exhibits no edema or tenderness.  Lymphadenopathy:    She has no cervical adenopathy.   Neurological: She is alert and oriented to person, place, and time. She has normal reflexes. She displays normal reflexes. No cranial nerve deficit. She exhibits normal muscle tone. Coordination normal.  Skin: Skin is  warm and dry. No rash noted. No erythema. No pallor.  Psychiatric: She has a normal mood and affect. Her behavior is normal. Judgment and thought content normal.     Assessment/Plan Abdominal pain ? Ileitis vs partial sbo NPO Hydrate with ns iv NGT to low intermittent suction Dilaudid iv prn pain Appreciate surgical input.  (have been consulted per ED)  N/v Zofran iv Protonix iv  Hypertension Cont current medications.  Hydralazine 24m iv q6h prn sbp >160  Hyperglycemia Check hga1c  Anemia Check cbc in am  Cardiac murmer Check echo  DVT prophylaxis: SCy Blamer8/05/2014, 7:14 PM

## 2014-11-08 NOTE — ED Notes (Addendum)
Patient here with complaints of upper right quadrant abd pain. Pain 8/10. Hx of gastric bypass. Describes pain as "bloated". Tums taken with no relief.

## 2014-11-08 NOTE — ED Notes (Signed)
Bed: WA17 Expected date:  Expected time:  Means of arrival:  Comments: ems 

## 2014-11-08 NOTE — ED Notes (Signed)
Patient informed that a urine specimen was needed. 

## 2014-11-08 NOTE — Consult Note (Signed)
[facility]  [address] [place], [place] [address] Phone: [phone] FAX: [phone]     MARRION FINAN  [birth year] [id number]  CARE TEAM:  PCP: Abigail Miyamoto, MD  Outpatient Care Team: Patient Care Team: Briscoe Deutscher, MD as PCP - General (Family Medicine) Laurence Spates, MD as Consulting Physician (Gastroenterology) Remo Lipps, MD as Consulting Physician (Specialist)  Inpatient Treatment Team: Treatment Team: Attending Provider: Deno Etienne, DO; Registered Nurse: Marilynne Drivers, RN; Physician Assistant: Jeannett Senior, PA-C; Technician: Robinette Haines, NT; Consulting Physician: Nolon Nations, MD  This patient is a 59 y.o.female who presents today for surgical evaluation at the request of Jeannett Senior, PA-C  Reason for evaluation: Abd pain, N/V.  ?SBO  59 year old female with abdominal pain that started yesterday.  Mainly in the lower abdomen.  Perhaps worse on the right.  Set episodes of worsening nausea and some retching.  She did have a gastric bypass done in 2001 at [facility].  Dr. Gavin Potters.  She struggled with dysphasia and had to have endoscopies and dilations a few times by [facility] gastroenterology, Dr Oletta Lamas - nothing for the last 10 years since the last operation..  She had a have a surgery in 2004 for what sounds like a small bowel obstruction.  Perhaps lyse adhesions down in her pelvis or her remaining ovary is what she recalls.  There is some question of take down or revision of the gastric bypass.  She does not recall exactly what was done.  Because she has been having worsening cramping abdominal pain, she came to the emergency room.  CT scan performed shows thickening of the terminal ileum and dilated small bowel.  Possible regional enteritis versus bowel obstruction.  Surgical consultation requested.  Patient last received morphine almost 6 hours ago.  Having mild soreness but no severe pain with cough or car  ride or bed shake.  There are no records from her surgeries.  She has not seen her bariatric surgeon in over a decade.  No sick contacts or travel history.  She usually has a bowel movement about every day.  That has not changed.  She has never had a colonoscopy.  No obvious hematochezia or melanotic or black tarry stools.  Normal laryngeal level and weight have been relatively stable.  She claims she lost about 100 pounds initially after the gastric bypass.  Past Medical History  Diagnosis Date  . Osteoporosis   . Hypertension   . Depression   . ADD (attention deficit disorder)   . Anastomotic stricture of gastrojejunostomy 2002    EGD dilation    Past Surgical History  Procedure Laterality Date  . Wrist surgery    . Gastric bypass  2001    [facility]  . Appendectomy    . Eye surgery    . Abdominal hysterectomy  2007  . Laparoscopic oopherectomy    . Esophagogastroduodenoscopy  2002    Dr Oletta Lamas.  GJ stricture    History   Social History  . Marital Status: Married    Spouse Name: N/A  . Number of Children: N/A  . Years of Education: N/A   Occupational History  . Not on file.   Social History Main Topics  . Smoking status: Current Every Day Smoker -- 0.50 packs/day    Types: Cigarettes  . Smokeless tobacco: Not on file  . Alcohol Use: Yes     Comment: 1 glass wine nightly  . Drug  Use: No  . Sexual Activity: Not on file   Other Topics Concern  . Not on file   Social History Narrative    Family History  Problem Relation Age of Onset  . Alzheimer's disease Mother   . Atrial fibrillation Father     Current Facility-Administered Medications  Medication Dose Route Frequency Provider Last Rate Last Dose  . dextrose 5 % and 0.45 % NaCl with KCl 40 mEq/L infusion   Intravenous Continuous Michael Boston, MD      . lactated ringers bolus 1,000 mL  1,000 mL Intravenous Q8H PRN Michael Boston, MD      . lactated ringers bolus 2,000 mL  2,000 mL Intravenous Once Michael Boston, MD      . metoprolol (LOPRESSOR) injection 5 mg  5 mg Intravenous Q6H PRN Michael Boston, MD      . ondansetron [facility] injection 4 mg  4 mg Intravenous Q6H PRN Michael Boston, MD       Or  . ondansetron (ZOFRAN) 8 mg in sodium chloride 0.9 % 50 mL IVPB  8 mg Intravenous Q6H PRN Michael Boston, MD      . promethazine (PHENERGAN) injection 6.25-12.5 mg  6.25-12.5 mg Intravenous Q4H PRN Michael Boston, MD       Current Outpatient Prescriptions  Medication Sig Dispense Refill  . amLODipine (NORVASC) 5 MG tablet Take 5 mg by mouth daily.    Marland Kitchen amphetamine-dextroamphetamine (ADDERALL) 30 MG tablet Take 30 mg by mouth 2 (two) times daily.    Marland Kitchen buPROPion (WELLBUTRIN XL) 150 MG 24 hr tablet TAKE 1 TABLET IN THE MORNING ONCE A DAY ORALLY 30 DAY(S)  5  . furosemide (LASIX) 40 MG tablet Take 40 mg by mouth 2 (two) times daily.    . naproxen sodium (ANAPROX) 220 MG tablet Take 440 mg by mouth daily as needed (pain).    Marland Kitchen venlafaxine XR (EFFEXOR-XR) 150 MG 24 hr capsule Take 150 mg by mouth daily with breakfast.       No Known Allergies  ROS: Constitutional:  No fevers, chills, sweats.  Weight stable Eyes:  No vision changes, No discharge HENT:  No sore throats, nasal drainage Lymph: No neck swelling, No bruising easily Pulmonary:  No cough, productive sputum CV: No orthopnea, PND  Patient walks 30 minutes for about 1 miles without difficulty.  No exertional chest/neck/shoulder/arm pain. GI: No personal nor family history of GI/colon cancer, inflammatory bowel disease, irritable bowel syndrome, allergy such as Celiac Sprue, dietary/dairy problems, colitis, ulcers nor gastritis.  No recent sick contacts/gastroenteritis.  No travel outside the country.  No changes in diet. Renal: No UTIs, No hematuria Genital:  No drainage, bleeding, masses Musculoskeletal: No severe joint pain.  Good ROM major joints Skin:  No sores or lesions.  No rashes Heme/Lymph:  No easy bleeding.  No swollen lymph nodes Neuro:  No focal weakness/numbness.  No seizures Psych: No suicidal ideation.  No hallucinations  BP 202/83 mmHg  Pulse 63  Temp(Src) 98 F (36.7 C) (Oral)  Resp 16  SpO2 100%  Physical Exam: General: Pt awake/alert/oriented x4 in no major acute distress.  Calm.  Conversational.  Not anxious. Eyes: PERRL, normal EOM. Sclera nonicteric Neuro: CN II-XII intact w/o focal sensory/motor deficits. Lymph: No head/neck/groin lymphadenopathy Psych:  No delerium/psychosis/paranoia HENT: Normocephalic, Mucus membranes moist.  No thrush Neck: Supple, No tracheal deviation Chest: No pain.  Good respiratory excursion. CV:  Pulses intact.  Regular rhythm Abdomen: Soft, Nondistended.  Mild BLQ tenderness.  No incarcerated hernias.  No incisional hernias.  No diastases.  No umbilical hernia.  No pain with cough or bed shake.  No rebound tenderness or guarding.  No percussive tenderness. Genital: No vaginal bleeding.  No inguinal hernias. Ext:  SCDs BLE.  No significant edema.  No cyanosis Skin: No petechiae / purpurea.  No major sores Musculoskeletal: No severe joint pain.  Good ROM major joints   Results:   Labs: Results for orders placed or performed during the hospital encounter of 11/08/14 (from the past 48 hour(s))  CBC with Differential     Status: Abnormal   Collection Time: 11/08/14  4:06 PM  Result Value Ref Range   WBC 9.0 4.0 - 10.5 K/uL   RBC 4.48 3.87 - 5.11 MIL/uL   Hemoglobin 10.7 (L) 12.0 - 15.0 g/dL   HCT 35.8 (L) 36.0 - 46.0 %   MCV 79.9 78.0 - 100.0 fL   MCH 23.9 (L) 26.0 - 34.0 pg   MCHC 29.9 (L) 30.0 - 36.0 g/dL   RDW 16.5 (H) 11.5 - 15.5 %   Platelets 378 150 - 400 K/uL   Neutrophils Relative % 85 (H) 43 - 77 %   Neutro Abs 7.6 1.7 - 7.7 K/uL   Lymphocytes Relative 9 (L) 12 - 46 %   Lymphs Abs 0.8 0.7 - 4.0 K/uL   Monocytes Relative 5 3 - 12 %   Monocytes Absolute 0.5 0.1 - 1.0 K/uL   Eosinophils Relative 1 0 - 5 %   Eosinophils Absolute 0.1 0.0 - 0.7 K/uL   Basophils  Relative 0 0 - 1 %   Basophils Absolute 0.0 0.0 - 0.1 K/uL  Comprehensive metabolic panel     Status: Abnormal   Collection Time: 11/08/14  4:06 PM  Result Value Ref Range   Sodium 137 135 - 145 mmol/L   Potassium 4.0 3.5 - 5.1 mmol/L   Chloride 104 101 - 111 mmol/L   CO2 25 22 - 32 mmol/L   Glucose, Bld 114 (H) 65 - 99 mg/dL   BUN 11 6 - 20 mg/dL   Creatinine, Ser 1.00 0.44 - 1.00 mg/dL   Calcium 9.3 8.9 - 10.3 mg/dL   Total Protein 7.5 6.5 - 8.1 g/dL   Albumin 4.5 3.5 - 5.0 g/dL   AST 20 15 - 41 U/L   ALT 16 14 - 54 U/L   Alkaline Phosphatase 97 38 - 126 U/L   Total Bilirubin 1.2 0.3 - 1.2 mg/dL   GFR calc non Af Amer 60 (L) >60 mL/min   GFR calc Af Amer >60 >60 mL/min    Comment: (NOTE) The eGFR has been calculated using the CKD EPI equation. This calculation has not been validated in all clinical situations. eGFR's persistently <60 mL/min signify possible Chronic Kidney Disease.    Anion gap 8 5 - 15  Lipase, blood     Status: Abnormal   Collection Time: 11/08/14  4:06 PM  Result Value Ref Range   Lipase 14 (L) 22 - 51 U/L  I-Stat CG4 Lactic Acid, ED     Status: None   Collection Time: 11/08/14  4:07 PM  Result Value Ref Range   Lactic Acid, Venous 0.51 0.5 - 2.0 mmol/L    Imaging / Studies: Ct Abdomen Pelvis W Contrast  11/08/2014   CLINICAL DATA:  Acute onset of lower abdominal pain, nausea and vomiting. Initial encounter.  EXAM: CT ABDOMEN AND PELVIS WITH CONTRAST  TECHNIQUE: Multidetector CT imaging of  the abdomen and pelvis was performed using the standard protocol following bolus administration of intravenous contrast.  CONTRAST:  160m OMNIPAQUE IOHEXOL 300 MG/ML  SOLN  COMPARISON:  None.  FINDINGS: The visualized lung bases are clear.  Trace ascites is noted surrounding the liver and spleen, and tracking along the paracolic gutters into the pelvis. The etiology of this fluid is unclear, but may be related to the small bowel process described below.  Diffuse  prominence of the intrahepatic biliary ducts, and dilatation of the common bile duct to 1.9 cm, reflects prior cholecystectomy. This may simply reflect the patient's baseline status post cholecystectomy, though would correlate for any evidence of post-cholecystectomy syndrome.  The liver and spleen are otherwise unremarkable. The pancreas and adrenal glands are within normal limits.  Apparent parenchymal striations within both kidneys are nonspecific and may reflect mild scarring, though would correlate clinically for any evidence of mild pyelonephritis. There is no evidence of hydronephrosis. No renal or ureteral stones are seen. No perinephric stranding is appreciated.  There is distention of small-bowel loops to 4.1 cm in maximal diameter, with underlying postoperative change. There is mild fecalization of the distal ileum, though it remains borderline normal in caliber. Wall thickening is noted at the terminal ileum, with mild soft tissue inflammation about the ileocecal junction. This may reflect infectious or inflammatory ileitis with small bowel dysmotility, or possibly partial obstruction due to underlying adhesion, given the patient's prior surgery.  The stomach is within normal limits. No acute vascular abnormalities are seen. Mild scattered calcification is noted along the abdominal aorta and its branches.  The appendix is not definitely seen. There is no evidence for appendicitis. The colon is partially filled with stool and is unremarkable in appearance, aside from the mild apparent soft tissue inflammation about the ileocecal junction and cecum.  The bladder is mildly distended and grossly unremarkable. The patient is status post hysterectomy. A 2.3 cm cyst is noted at the left side of the vaginal cuff. No suspicious adnexal masses are seen. No inguinal lymphadenopathy is seen.  No acute osseous abnormalities are identified. Mild facet disease is noted at the lower lumbar spine.  IMPRESSION: 1.  Distention of small-bowel loops to 4.1 cm in maximal diameter, with underlying postoperative change. Mild fecalization of the distal ileum, with wall thickening at the terminal ileum and mild soft tissue inflammation about the ileocecal junction and cecum. This may reflect an infectious or inflammatory ileitis, with small bowel dysmotility, or possibly partial obstruction due to an underlying adhesion, given the patient's prior surgery. 2. Trace ascites noted within the abdomen and pelvis. 3. Diffuse prominence of the intrahepatic biliary ducts, and dilatation of the common bile duct to 1.9 cm, reflecting prior cholecystectomy. This may simply reflect the patient's baseline status post cholecystectomy, though would correlate clinically for any evidence of post-cholecystectomy syndrome. 4. Apparent parenchymal striations within both kidneys are nonspecific and may reflect mild scarring, though would correlate clinically for any evidence of mild pyelonephritis. 5. Mild scattered calcification along the abdominal aorta and its branches. 6. 2.3 cm cyst noted at the left side of the vaginal cuff.   Electronically Signed   By: JGarald BaldingM.D.   On: 11/08/2014 18:10   Dg Abd 2 Views  11/08/2014   CLINICAL DATA:  Abdominal pain and nausea for 2 days. History of gastric bypass surgery.  EXAM: ABDOMEN - 2 VIEW  COMPARISON:  None.  FINDINGS: There is some scattered air and stool in the transverse colon. There  are slightly dilated small bowel loops with air-fluid levels in the mid to lower abdomen and left lower quadrant suspicious for early small bowel obstruction. No free air. The soft tissue shadows are maintained. Extensive surgical changes. The lung bases are clear. The bony structures are intact.  IMPRESSION: Plain film findings suggest an early or partial small bowel obstruction.   Electronically Signed   By: Marijo Sanes M.D.   On: 11/08/2014 16:33    Medications / Allergies: per  chart  Antibiotics: Anti-infectives    None      Assessment  Lacy Duverney  59 y.o. female       Problem List:  Active Problems:   Ileitis   History of gastric bypass 2001   Abdominal pain   Never has had a colonoscopy   Nausea and vomiting with small bowel dilation with transition near a thickened ileum.  Suspicious to me more for Crohn's.  Other possibility is small bowel obstruction. Plan:  Admit.  Obtain medical records from prior surgeries.  Too far back to see in Care Everywhere . Aggressive IV fluid resuscitation.  Follow up x-ray films in the morning.  If CT contrast goes into colon, argues against a significant obstruction.  Strongly consider gastroenterology reevaluation.  Consider colonoscopy with biopsy of ileum.  She is overdue for a colonoscopy since she has never had one anyway.  If there is evidence of Crohn's ileitis, treat with immunosuppression.  Should help resolve it since she does not have any history of Crohn's or prior inflammation problems in the past.  If she does not have Crohn's and she does not resolve her obstructive/dysmotility issues despite a medical care, consider diagnostic laparoscopy or laparotomy to rule out adhesions or mass causing obstruction.  She does not look toxic so I do not sense she needs emergency surgery at this time.  There is not look like internal hernia or torsion.  Nausea control.  Blood pressure control.  The patient is stable.  There is no evidence of peritonitis, acute abdomen, nor shock.  There is no strong evidence of failure of improvement nor decline with current non-operative management.  There is no need for surgery at the present moment.   We will continue to follow.  -VTE prophylaxis- SCDs, etc  -mobilize as tolerated to help recovery    Adin Hector, M.D., F.A.C.S. Gastrointestinal and Minimally Invasive Surgery [facility] [address] [place], [place]  [address] [phone] Main / Paging   11/08/2014  Note: Portions of this report may have been transcribed using voice recognition software. Every effort was made to ensure accuracy; however, inadvertent computerized transcription errors may be present.   Any transcriptional errors that result from this process are unintentional.

## 2014-11-09 ENCOUNTER — Observation Stay (HOSPITAL_COMMUNITY): Payer: BLUE CROSS/BLUE SHIELD

## 2014-11-09 DIAGNOSIS — R109 Unspecified abdominal pain: Secondary | ICD-10-CM

## 2014-11-09 DIAGNOSIS — K5669 Other intestinal obstruction: Secondary | ICD-10-CM | POA: Diagnosis not present

## 2014-11-09 DIAGNOSIS — M810 Age-related osteoporosis without current pathological fracture: Secondary | ICD-10-CM | POA: Diagnosis present

## 2014-11-09 DIAGNOSIS — R011 Cardiac murmur, unspecified: Secondary | ICD-10-CM | POA: Diagnosis not present

## 2014-11-09 DIAGNOSIS — Z5329 Procedure and treatment not carried out because of patient's decision for other reasons: Secondary | ICD-10-CM

## 2014-11-09 DIAGNOSIS — Z9884 Bariatric surgery status: Secondary | ICD-10-CM

## 2014-11-09 DIAGNOSIS — F329 Major depressive disorder, single episode, unspecified: Secondary | ICD-10-CM | POA: Diagnosis present

## 2014-11-09 DIAGNOSIS — K529 Noninfective gastroenteritis and colitis, unspecified: Secondary | ICD-10-CM | POA: Diagnosis not present

## 2014-11-09 DIAGNOSIS — I10 Essential (primary) hypertension: Secondary | ICD-10-CM | POA: Diagnosis present

## 2014-11-09 DIAGNOSIS — F17210 Nicotine dependence, cigarettes, uncomplicated: Secondary | ICD-10-CM | POA: Diagnosis present

## 2014-11-09 DIAGNOSIS — Z90710 Acquired absence of both cervix and uterus: Secondary | ICD-10-CM | POA: Diagnosis not present

## 2014-11-09 DIAGNOSIS — Z9049 Acquired absence of other specified parts of digestive tract: Secondary | ICD-10-CM | POA: Diagnosis present

## 2014-11-09 LAB — COMPREHENSIVE METABOLIC PANEL
ALBUMIN: 3.4 g/dL — AB (ref 3.5–5.0)
ALK PHOS: 131 U/L — AB (ref 38–126)
ALT: 116 U/L — ABNORMAL HIGH (ref 14–54)
ANION GAP: 6 (ref 5–15)
AST: 313 U/L — ABNORMAL HIGH (ref 15–41)
BUN: 9 mg/dL (ref 6–20)
CALCIUM: 8.3 mg/dL — AB (ref 8.9–10.3)
CO2: 27 mmol/L (ref 22–32)
Chloride: 106 mmol/L (ref 101–111)
Creatinine, Ser: 0.97 mg/dL (ref 0.44–1.00)
GFR calc non Af Amer: >60 mL/min (ref >60)
GLUCOSE: 116 mg/dL — AB (ref 65–99)
POTASSIUM: 3.6 mmol/L (ref 3.5–5.1)
Sodium: 139 mmol/L (ref 135–145)
TOTAL PROTEIN: 5.6 g/dL — AB (ref 6.5–8.1)
Total Bilirubin: 1.9 mg/dL — ABNORMAL HIGH (ref 0.3–1.2)

## 2014-11-09 LAB — CBC
HCT: 29 % — ABNORMAL LOW (ref 36.0–46.0)
HEMOGLOBIN: 9.2 g/dL — AB (ref 12.0–15.0)
MCH: 25.9 pg — AB (ref 26.0–34.0)
MCHC: 31.7 g/dL (ref 30.0–36.0)
MCV: 81.7 fL (ref 78.0–100.0)
PLATELETS: 306 10*3/uL (ref 150–400)
RBC: 3.55 MIL/uL — ABNORMAL LOW (ref 3.87–5.11)
RDW: 16.9 % — AB (ref 11.5–15.5)
WBC: 5.8 10*3/uL (ref 4.0–10.5)

## 2014-11-09 LAB — URINALYSIS, ROUTINE W REFLEX MICROSCOPIC
Bilirubin Urine: NEGATIVE
Glucose, UA: NEGATIVE mg/dL
Hgb urine dipstick: NEGATIVE
KETONES UR: NEGATIVE mg/dL
Leukocytes, UA: NEGATIVE
NITRITE: NEGATIVE
PH: 6 (ref 5.0–8.0)
PROTEIN: NEGATIVE mg/dL
Specific Gravity, Urine: 1.019 (ref 1.005–1.030)
Urobilinogen, UA: 1 mg/dL (ref 0.0–1.0)

## 2014-11-09 NOTE — Progress Notes (Signed)
Subjective: Pain is better but still present lower abdomen.  No nausea or vomiting.    Objective: Vital signs in last 24 hours: Temp:  [98 F (36.7 C)-99.2 F (37.3 C)] 99.2 F (37.3 C) (08/03 0539) Pulse Rate:  [63-72] 72 (08/03 0539) Resp:  [16-18] 18 (08/03 0539) BP: (141-202)/(59-83) 141/59 mmHg (08/03 0539) SpO2:  [91 %-100 %] 91 % (08/03 0539) Weight:  [66.769 kg (147 lb 3.2 oz)-68.085 kg (150 lb 1.6 oz)] 68.085 kg (150 lb 1.6 oz) (08/03 0539) Last BM Date: 11/08/14 NPO Afebrile, VSS WBC is normal, H/H down some LFT's up Film this AM:  No contrast in the colon, still looks like SBO Intake/Output from previous day: 08/02 0701 - 08/03 0700 In: 1295 [I.V.:895; IV Piggyback:400] Out: 900 [Urine:900] Intake/Output this shift: Total I/O In: -  Out: 150 [Urine:150]  General appearance: alert, cooperative and no distress GI: sore and a little tender lower abdomen, no one place just diffuse discomfort.  no distension , no peritonitis.  Lab Results:   Recent Labs  11/08/14 1606 11/09/14 0350  WBC 9.0 5.8  HGB 10.7* 9.2*  HCT 35.8* 29.0*  PLT 378 306    BMET  Recent Labs  11/08/14 1606 11/09/14 0350  NA 137 139  K 4.0 3.6  CL 104 106  CO2 25 27  GLUCOSE 114* 116*  BUN 11 9  CREATININE 1.00 0.97  CALCIUM 9.3 8.3*   PT/INR No results for input(s): LABPROT, INR in the last 72 hours.   Recent Labs Lab 11/08/14 1606 11/09/14 0350  AST 20 313*  ALT 16 116*  ALKPHOS 97 131*  BILITOT 1.2 1.9*  PROT 7.5 5.6*  ALBUMIN 4.5 3.4*     Lipase     Component Value Date/Time   LIPASE 14* 11/08/2014 1606     Studies/Results: Ct Abdomen Pelvis W Contrast  11/08/2014   CLINICAL DATA:  Acute onset of lower abdominal pain, nausea and vomiting. Initial encounter.  EXAM: CT ABDOMEN AND PELVIS WITH CONTRAST  TECHNIQUE: Multidetector CT imaging of the abdomen and pelvis was performed using the standard protocol following bolus administration of intravenous  contrast.  CONTRAST:  OMNIPAQUE IOHEXOL 300 MG/ML  SOLN  COMPARISON:  None.  FINDINGS: The visualized lung bases are clear.  Trace ascites is noted surrounding the liver and spleen, and tracking along the paracolic gutters into the pelvis. The etiology of this fluid is unclear, but may be related to the small bowel process described below.  Diffuse prominence of the intrahepatic biliary ducts, and dilatation of the common bile duct to 1.9 cm, reflects prior cholecystectomy. This may simply reflect the patient's baseline status post cholecystectomy, though would correlate for any evidence of post-cholecystectomy syndrome.  The liver and spleen are otherwise unremarkable. The pancreas and adrenal glands are within normal limits.  Apparent parenchymal striations within both kidneys are nonspecific and may reflect mild scarring, though would correlate clinically for any evidence of mild pyelonephritis. There is no evidence of hydronephrosis. No renal or ureteral stones are seen. No perinephric stranding is appreciated.  There is distention of small-bowel loops to 4.1 cm in maximal diameter, with underlying postoperative change. There is mild fecalization of the distal ileum, though it remains borderline normal in caliber. Wall thickening is noted at the terminal ileum, with mild soft tissue inflammation about the ileocecal junction. This may reflect infectious or inflammatory ileitis with small bowel dysmotility, or possibly partial obstruction due to underlying adhesion, given the patient's prior surgery.  The stomach is within normal limits. No acute vascular abnormalities are seen. Mild scattered calcification is noted along the abdominal aorta and its branches.  The appendix is not definitely seen. There is no evidence for appendicitis. The colon is partially filled with stool and is unremarkable in appearance, aside from the mild apparent soft tissue inflammation about the ileocecal junction and cecum.  The  bladder is mildly distended and grossly unremarkable. The patient is status post hysterectomy. A 2.3 cm cyst is noted at the left side of the vaginal cuff. No suspicious adnexal masses are seen. No inguinal lymphadenopathy is seen.  No acute osseous abnormalities are identified. Mild facet disease is noted at the lower lumbar spine.  IMPRESSION: 1. Distention of small-bowel loops to 4.1 cm in maximal diameter, with underlying postoperative change. Mild fecalization of the distal ileum, with wall thickening at the terminal ileum and mild soft tissue inflammation about the ileocecal junction and cecum. This may reflect an infectious or inflammatory ileitis, with small bowel dysmotility, or possibly partial obstruction due to an underlying adhesion, given the patient's prior surgery. 2. Trace ascites noted within the abdomen and pelvis. 3. Diffuse prominence of the intrahepatic biliary ducts, and dilatation of the common bile duct to 1.9 cm, reflecting prior cholecystectomy. This may simply reflect the patient's baseline status post cholecystectomy, though would correlate clinically for any evidence of post-cholecystectomy syndrome. 4. Apparent parenchymal striations within both kidneys are nonspecific and may reflect mild scarring, though would correlate clinically for any evidence of mild pyelonephritis. 5. Mild scattered calcification along the abdominal aorta and its branches. 6. 2.3 cm cyst noted at the left side of the vaginal cuff.   Electronically Signed   By: Roanna Raider M.D.   On: 11/08/2014 18:10   Dg Abd 2 Views  11/09/2014   CLINICAL DATA:  Lower abdominal pain bloating and nausea, history of ileitis and bowel obstruction requiring surgery in 2004  EXAM: ABDOMEN - 2 VIEW  COMPARISON:  Abdominal and pelvic CT scan of November 08, 2014  FINDINGS: There remain loops of moderately distended gas and fluid-filled small bowel in the mid and lower abdomen. There is contrast within mildly distended small bowel  loops in the pelvis. The colonic gas and stool pattern is unremarkable. There are numerous surgical clips in the mid/upper abdomen, the right upper quadrant, the right mid abdomen, and in the pelvis.  There is gentle S shaped thoracolumbar scoliosis. The lung bases are clear. There are mild degenerative changes of both hips.  IMPRESSION: Findings consistent with a partial distal small bowel obstruction. There is no evidence of perforation.   Electronically Signed   By: David  [place] M.D.   On: 11/09/2014 09:10   Dg Abd 2 Views  11/08/2014   CLINICAL DATA:  Abdominal pain and nausea for 2 days. History of gastric bypass surgery.  EXAM: ABDOMEN - 2 VIEW  COMPARISON:  None.  FINDINGS: There is some scattered air and stool in the transverse colon. There are slightly dilated small bowel loops with air-fluid levels in the mid to lower abdomen and left lower quadrant suspicious for early small bowel obstruction. No free air. The soft tissue shadows are maintained. Extensive surgical changes. The lung bases are clear. The bony structures are intact.  IMPRESSION: Plain film findings suggest an early or partial small bowel obstruction.   Electronically Signed   By: Rudie Meyer M.D.   On: 11/08/2014 16:33    Medications: . sodium chloride   Intravenous STAT  .  amLODipine  5 mg Oral Daily  . buPROPion  150 mg Oral Daily  . ciprofloxacin  400 mg Intravenous Q12H  . enoxaparin (LOVENOX) injection  40 mg Subcutaneous Q24H  . furosemide  40 mg Oral BID  . lip balm  1 application Topical BID  . metronidazole  500 mg Intravenous Q8H  . saccharomyces boulardii  250 mg Oral BID  . sodium chloride  3 mL Intravenous Q12H  . venlafaxine XR  150 mg Oral Q breakfast    Assessment/Plan  Ileitis History of gastric bypass 2001  Abdominal pain  Never has had a colonoscopy Hx hypertension Hx Depression/ADD Tobacco use Antibiotics:  Day 2 Flagyl and Cipro  DVT: Lovenox/SCD  Plan:  Staff is trying to get old  records, she is better with antibiotics.  We will continue this course for now.  Delphine Sizemore 11/09/2014

## 2014-11-09 NOTE — Plan of Care (Signed)
Problem: Phase I Progression Outcomes Goal: Pain controlled with appropriate interventions Outcome: Completed/Met Date Met:  11/09/14 Medication therapy has been effective in pain management and patient has stated that pain has been absent after pain  Medication ( dilaudid)  administration.

## 2014-11-09 NOTE — Progress Notes (Signed)
Tiffany Mueller ZOX:[id number] DOB: 12-15-1955 DOA: 11/08/2014 PCP: Lenora Boys, MD  Brief narrative: 59 y/o ? htn, Bipolar, ADD, Anastomotic strict 2002, Htn s/p GOP 2003 Dr. Enzo Bi and multiple other surgeries in the past. She had a reversal of her gastric bypass in 2004 because of excessive vomiting and nausea She's never had an SBO before  Past medical history-As per Problem list Chart reviewed as below-   Consultants:  General surgery  Procedures:    Antibiotics:  None   Subjective  Alert oriented had some nausea vomiting earlier today 2 Dilaudid 1 and did fair Tolerating liquids No stool no flatus as yet still some mild abdominal discomfort   Objective    Interim History:   Telemetry:    Objective: Filed Vitals:   11/08/14 1516 11/08/14 1922 11/08/14 1948 11/09/14 0539  BP: 202/83 162/59 180/73 141/59  Pulse: 63 68 67 72  Temp: 98 F (36.7 C) 98.5 F (36.9 C) 98.9 F (37.2 C) 99.2 F (37.3 C)  TempSrc: Oral Oral Oral Oral  Resp: Height:    (1.575 m)   Weight:   66.769 kg (147 lb 3.2 oz) 68.085 kg (150 lb 1.6 oz)  SpO2: 100% 100%  91%    Intake/Output Summary (Last 24 hours) at 11/09/14 1315 Last data filed at 11/09/14 0813  Gross per 24 hour  Intake   1295 ml  Output   1050 ml  Net    245 ml    Exam:  EOMI pleasant no apparent distress mild pallor no icterus no lymphadenopathy S1-S2 no murmur rub or gallop Abdomen soft slightly tender in right lower quadrant no rebound Chest is clear no lower extremity edema   Data Reviewed: Basic Metabolic Panel:  Recent Labs Lab 11/08/14 1606 11/09/14 0350  NA 137 139  K 4.0 3.6  CL 104 106  CO2 25 27  GLUCOSE 114* 116*  BUN 11 9  CREATININE 1.00 0.97  CALCIUM 9.3 8.3*   Liver Function Tests:  Recent Labs Lab 11/08/14 1606 11/09/14 0350  AST 20 313*  ALT 16 116*  ALKPHOS 97 131*  BILITOT 1.2 1.9*  PROT 7.5 5.6*  ALBUMIN 4.5 3.4*    Recent Labs Lab  11/08/14 1606  LIPASE 14*   No results for input(s): AMMONIA in the last 168 hours. CBC:  Recent Labs Lab 11/08/14 1606 11/09/14 0350  WBC 9.0 5.8  NEUTROABS 7.6  --   HGB 10.7* 9.2*  HCT 35.8* 29.0*  MCV 79.9 81.7  PLT 378 306   Cardiac Enzymes: No results for input(s): CKTOTAL, CKMB, CKMBINDEX, TROPONINI in the last 168 hours. BNP: Invalid input(s): POCBNP CBG: No results for input(s): GLUCAP in the last 168 hours.  No results found for this or any previous visit (from the past 240 hour(s)).   Studies:              All Imaging reviewed and is as per above notation   Scheduled Meds: . sodium chloride   Intravenous STAT  . amLODipine  5 mg Oral Daily  . buPROPion  150 mg Oral Daily  . ciprofloxacin  400 mg Intravenous Q12H  . enoxaparin (LOVENOX) injection  40 mg Subcutaneous Q24H  . furosemide  40 mg Oral BID  . lip balm  1 application Topical BID  . metronidazole  500 mg Intravenous Q8H  . saccharomyces boulardii  250 mg Oral BID  . sodium chloride  3 mL Intravenous  Q12H  . venlafaxine XR  150 mg Oral Q breakfast   Continuous Infusions: . sodium chloride 100 mL/hr at 11/08/14 2203     Assessment/Plan:  Active Problems:  Partial small bowel structure--it is highly unlikely she has an ileitis and I think only one more day of antibiotics is appropriate in this setting as CT scan of abdomen and pelvis can overcall normal physiological changes as being infection Appreciate general surgery input Drip and suck Pain management Repeat x-ray a.m.     History of gastric bypass 2001 -We're waiting on notes from [facility] medical where she had bypass    Never has had a colonoscopy -Will need 1  Depression Continue Effexor XR 150 daily continue Bupropion 150 daily   Htn Continue Amlodipine 10 daily   Appt with PCP: Requested Code Status: Full code/DNR Family Communication: family + Disposition Plan: home/snf DVT prophylaxis: SCD Consultants:   Pleas Koch, MD  [facility] Pager [phone] 11/09/2014, 1:15 PM

## 2014-11-09 NOTE — Care Management Note (Signed)
Case Management Note  Patient Details  Name: Tiffany Mueller MRN: [id number] Date of Birth: [birth year]  Subjective/Objective:  59 y/o f admitted w/Ileitis. From home.                  Action/Plan:d/c plan home. No anticipated d/c needs.   Expected Discharge Date:   (unknown)               Expected Discharge Plan:  Home/Self Care  In-House Referral:     Discharge planning Services  CM Consult  Post Acute Care Choice:    Choice offered to:     DME Arranged:    DME Agency:     HH Arranged:    HH Agency:     Status of Service:  In process, will continue to follow  Medicare Important Message Given:    Date Medicare IM Given:    Medicare IM give by:    Date Additional Medicare IM Given:    Additional Medicare Important Message give by:     If discussed at Long Length of Stay Meetings, dates discussed:    Additional Comments:  Lanier Clam, RN 11/09/2014, 10:56 AM

## 2014-11-09 NOTE — Progress Notes (Signed)
Echocardiogram 2D Echocardiogram has been performed.  Tiffany Mueller 11/09/2014, 10:31 AM

## 2014-11-10 ENCOUNTER — Inpatient Hospital Stay (HOSPITAL_COMMUNITY): Payer: BLUE CROSS/BLUE SHIELD

## 2014-11-10 NOTE — Progress Notes (Signed)
Tiffany Mueller [id number] DOB: 08-Mar-1956 DOA: 11/08/2014 PCP: Lenora Boys, MD  Brief narrative: 60 y/o ? htn, Bipolar, ADD, Anastomotic strict 2002, Htn s/p GOP 2003 Dr. Enzo Bi and multiple other surgeries in the past. She had a reversal of her gastric bypass in 2004 because of excessive vomiting and nausea She's never had an SBO before   Past medical history-As per Problem list Chart reviewed as below-   Consultants:  General surgery  Procedures:    Antibiotics:  None   Subjective   Flautus last pm ?' s about not eating No cp No fever no chills   Objective    Interim History:   Telemetry:    Objective: Filed Vitals:   11/09/14 0539 11/09/14 1432 11/09/14 2300 11/10/14 0535  BP: 141/59 120/56 136/63 132/48  Pulse: 72 76 76 68  Temp: 99.2 F (37.3 C) 97.9 F (36.6 C) 98.4 F (36.9 C) 98.3 F (36.8 C)  TempSrc: Oral Oral Oral   Resp: Height:      Weight: 68.085 kg (150 lb 1.6 oz)   65.227 kg (143 lb 12.8 oz)  SpO2: 91% 95% 99% 96%    Intake/Output Summary (Last 24 hours) at 11/10/14 0923 Last data filed at 11/10/14 0548  Gross per 24 hour  Intake   1960 ml  Output   3900 ml  Net  -1940 ml    Exam:  EOMI pleasant no apparent distress mild pallor no icterus no lymphadenopathy S1-S2 no murmur rub or gallop Abdomen soft slightly tender in right lower quadrant no rebound Chest is clear no lower extremity edema   Data Reviewed: Basic Metabolic Panel:  Recent Labs Lab 11/08/14 1606 11/09/14 0350  NA 137 139  K 4.0 3.6  CL 104 106  CO2 25 27  GLUCOSE 114* 116*  BUN 11 9  CREATININE 1.00 0.97  CALCIUM 9.3 8.3*   Liver Function Tests:  Recent Labs Lab 11/08/14 1606 11/09/14 0350  AST 20 313*  ALT 16 116*  ALKPHOS 97 131*  BILITOT 1.2 1.9*  PROT 7.5 5.6*  ALBUMIN 4.5 3.4*    Recent Labs Lab 11/08/14 1606  LIPASE 14*   No results for input(s): AMMONIA in the last 168 hours. CBC:  Recent  Labs Lab 11/08/14 1606 11/09/14 0350  WBC 9.0 5.8  NEUTROABS 7.6  --   HGB 10.7* 9.2*  HCT 35.8* 29.0*  MCV 79.9 81.7  PLT 378 306   Cardiac Enzymes: No results for input(s): CKTOTAL, CKMB, CKMBINDEX, TROPONINI in the last 168 hours. BNP: Invalid input(s): POCBNP CBG: No results for input(s): GLUCAP in the last 168 hours.  No results found for this or any previous visit (from the past 240 hour(s)).   Studies:              All Imaging reviewed and is as per above notation   Scheduled Meds: . amLODipine  5 mg Oral Daily  . buPROPion  150 mg Oral Daily  . ciprofloxacin  400 mg Intravenous Q12H  . enoxaparin (LOVENOX) injection  40 mg Subcutaneous Q24H  . furosemide  40 mg Oral BID  . lip balm  1 application Topical BID  . metronidazole  500 mg Intravenous Q8H  . saccharomyces boulardii  250 mg Oral BID  . sodium chloride  3 mL Intravenous Q12H  . venlafaxine XR  150 mg Oral Q breakfast   Continuous Infusions:     Assessment/Plan:  Active Problems:  Partial small bowel structure--it is highly unlikely she has an ileitis and I think only one more day of antibiotics is appropriate in this setting as CT scan of abdomen and pelvis can overcall normal physiological changes as being infection Appreciate general surgery input Drip and suck Pain management Repeat x-ray 8/4=PSDB Will try clears but defer ultimately to gen surgery  D/c tele Walk on floor Off unit privileges May shower     History of gastric bypass 2001 -We're waiting on notes from [facility] medical where she had bypass -nursing aware    Never has had a colonoscopy -Will need 1  Depression Continue Effexor XR 150 daily continue Bupropion 150 daily  Htn Well controlled Continue Amlodipine 10 daily   Appt with PCP: Requested Code Status: Full code/DNR Family Communication: family + Disposition Plan: home/snf DVT prophylaxis: SCD Consultants:   Pleas Koch, MD  [facility] Pager  [phone] 11/10/2014, 9:23 AM    LOS: 1 day

## 2014-11-10 NOTE — Plan of Care (Signed)
Problem: Phase II Progression Outcomes Goal: Progress activity as tolerated unless otherwise ordered Outcome: Completed/Met Date Met:  11/10/14 Patient ambulating without issues

## 2014-11-10 NOTE — Progress Notes (Signed)
Subjective: Still complains of being tender,but moving well, cough good, says it hurts to take in a deep breath, but she is comfortable talking and walking.    Objective: Vital signs in last 24 hours: Temp:  [97.9 F (36.6 C)-98.4 F (36.9 C)] 98.3 F (36.8 C) (08/04 0535) Pulse Rate:  [68-76] 68 (08/04 0535) Resp:  [16-20] 18 (08/04 0535) BP: (120-136)/(48-63) 132/48 mmHg (08/04 0535) SpO2:  [95 %-99 %] 96 % (08/04 0535) Weight:  [65.227 kg (143 lb 12.8 oz)] 65.227 kg (143 lb 12.8 oz) (08/04 0535) Last BM Date: 11/08/14 60 PO clears ordered this AM No BM Afebrile, VSS No labs this AM  Intake/Output from previous day: 08/03 0701 - 08/04 0700 In: 1960 [P.O.:60; I.V.:1200; IV Piggyback:700] Out: 4050 [Urine:4050] Intake/Output this shift: Total I/O In: 840 [P.O.:840] Out: -   General appearance: alert, cooperative and no distress GI: soft, still some tenderness, rather diffuse upper abdomen.  Moves well.  No peritonitis  Lab Results:   Recent Labs  11/08/14 1606 11/09/14 0350  WBC 9.0 5.8  HGB 10.7* 9.2*  HCT 35.8* 29.0*  PLT 378 306    BMET  Recent Labs  11/08/14 1606 11/09/14 0350  NA 137 139  K 4.0 3.6  CL 104 106  CO2 25 27  GLUCOSE 114* 116*  BUN 11 9  CREATININE 1.00 0.97  CALCIUM 9.3 8.3*   PT/INR No results for input(s): LABPROT, INR in the last 72 hours.   Recent Labs Lab 11/08/14 1606 11/09/14 0350  AST 20 313*  ALT 16 116*  ALKPHOS 97 131*  BILITOT 1.2 1.9*  PROT 7.5 5.6*  ALBUMIN 4.5 3.4*     Lipase     Component Value Date/Time   LIPASE 14* 11/08/2014 1606     Studies/Results: Ct Abdomen Pelvis W Contrast  11/08/2014   CLINICAL DATA:  Acute onset of lower abdominal pain, nausea and vomiting. Initial encounter.  EXAM: CT ABDOMEN AND PELVIS WITH CONTRAST  TECHNIQUE: Multidetector CT imaging of the abdomen and pelvis was performed using the standard protocol following bolus administration of intravenous contrast.  CONTRAST:   OMNIPAQUE IOHEXOL 300 MG/ML  SOLN  COMPARISON:  None.  FINDINGS: The visualized lung bases are clear.  Trace ascites is noted surrounding the liver and spleen, and tracking along the paracolic gutters into the pelvis. The etiology of this fluid is unclear, but may be related to the small bowel process described below.  Diffuse prominence of the intrahepatic biliary ducts, and dilatation of the common bile duct to 1.9 cm, reflects prior cholecystectomy. This may simply reflect the patient's baseline status post cholecystectomy, though would correlate for any evidence of post-cholecystectomy syndrome.  The liver and spleen are otherwise unremarkable. The pancreas and adrenal glands are within normal limits.  Apparent parenchymal striations within both kidneys are nonspecific and may reflect mild scarring, though would correlate clinically for any evidence of mild pyelonephritis. There is no evidence of hydronephrosis. No renal or ureteral stones are seen. No perinephric stranding is appreciated.  There is distention of small-bowel loops to 4.1 cm in maximal diameter, with underlying postoperative change. There is mild fecalization of the distal ileum, though it remains borderline normal in caliber. Wall thickening is noted at the terminal ileum, with mild soft tissue inflammation about the ileocecal junction. This may reflect infectious or inflammatory ileitis with small bowel dysmotility, or possibly partial obstruction due to underlying adhesion, given the patient's prior surgery.  The stomach is within normal limits.  No acute vascular abnormalities are seen. Mild scattered calcification is noted along the abdominal aorta and its branches.  The appendix is not definitely seen. There is no evidence for appendicitis. The colon is partially filled with stool and is unremarkable in appearance, aside from the mild apparent soft tissue inflammation about the ileocecal junction and cecum.  The bladder is mildly  distended and grossly unremarkable. The patient is status post hysterectomy. A 2.3 cm cyst is noted at the left side of the vaginal cuff. No suspicious adnexal masses are seen. No inguinal lymphadenopathy is seen.  No acute osseous abnormalities are identified. Mild facet disease is noted at the lower lumbar spine.  IMPRESSION: 1. Distention of small-bowel loops to 4.1 cm in maximal diameter, with underlying postoperative change. Mild fecalization of the distal ileum, with wall thickening at the terminal ileum and mild soft tissue inflammation about the ileocecal junction and cecum. This may reflect an infectious or inflammatory ileitis, with small bowel dysmotility, or possibly partial obstruction due to an underlying adhesion, given the patient's prior surgery. 2. Trace ascites noted within the abdomen and pelvis. 3. Diffuse prominence of the intrahepatic biliary ducts, and dilatation of the common bile duct to 1.9 cm, reflecting prior cholecystectomy. This may simply reflect the patient's baseline status post cholecystectomy, though would correlate clinically for any evidence of post-cholecystectomy syndrome. 4. Apparent parenchymal striations within both kidneys are nonspecific and may reflect mild scarring, though would correlate clinically for any evidence of mild pyelonephritis. 5. Mild scattered calcification along the abdominal aorta and its branches. 6. 2.3 cm cyst noted at the left side of the vaginal cuff.   Electronically Signed   By: Roanna Raider M.D.   On: 11/08/2014 18:10   Dg Abd 2 Views  11/10/2014   CLINICAL DATA:  Small-bowel obstruction.  EXAM: ABDOMEN - 2 VIEW  COMPARISON:  11/09/2014.  FINDINGS: Soft tissue structures are unremarkable. Surgical clips sutures noted over the abdomen and pelvis. Oral contrast noted throughout the colon. Interim partial resolution of small bowel distention. No free air. Small right pleural effusion. Thoracolumbar spine scoliosis.  IMPRESSION: 1. Interim  partial resolution of small bowel distention. Oral contrast noted throughout the colon.  2.  Small right pleural effusion.   Electronically Signed   By: Maisie Fus  Register   On: 11/10/2014 08:43   Dg Abd 2 Views  11/09/2014   CLINICAL DATA:  Lower abdominal pain bloating and nausea, history of ileitis and bowel obstruction requiring surgery in 2004  EXAM: ABDOMEN - 2 VIEW  COMPARISON:  Abdominal and pelvic CT scan of November 08, 2014  FINDINGS: There remain loops of moderately distended gas and fluid-filled small bowel in the mid and lower abdomen. There is contrast within mildly distended small bowel loops in the pelvis. The colonic gas and stool pattern is unremarkable. There are numerous surgical clips in the mid/upper abdomen, the right upper quadrant, the right mid abdomen, and in the pelvis.  There is gentle S shaped thoracolumbar scoliosis. The lung bases are clear. There are mild degenerative changes of both hips.  IMPRESSION: Findings consistent with a partial distal small bowel obstruction. There is no evidence of perforation.   Electronically Signed   By: David  [place] M.D.   On: 11/09/2014 09:10   Dg Abd 2 Views  11/08/2014   CLINICAL DATA:  Abdominal pain and nausea for 2 days. History of gastric bypass surgery.  EXAM: ABDOMEN - 2 VIEW  COMPARISON:  None.  FINDINGS: There is some  scattered air and stool in the transverse colon. There are slightly dilated small bowel loops with air-fluid levels in the mid to lower abdomen and left lower quadrant suspicious for early small bowel obstruction. No free air. The soft tissue shadows are maintained. Extensive surgical changes. The lung bases are clear. The bony structures are intact.  IMPRESSION: Plain film findings suggest an early or partial small bowel obstruction.   Electronically Signed   By: Rudie Meyer M.D.   On: 11/08/2014 16:33    Medications: . amLODipine  5 mg Oral Daily  . buPROPion  150 mg Oral Daily  . ciprofloxacin  400 mg Intravenous  Q12H  . enoxaparin (LOVENOX) injection  40 mg Subcutaneous Q24H  . furosemide  40 mg Oral BID  . lip balm  1 application Topical BID  . metronidazole  500 mg Intravenous Q8H  . saccharomyces boulardii  250 mg Oral BID  . sodium chloride  3 mL Intravenous Q12H  . 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  Recheck labs in AM.       LOS: 1 day    Tiffany Mueller 11/10/2014

## 2014-11-11 ENCOUNTER — Inpatient Hospital Stay (HOSPITAL_COMMUNITY): Payer: BLUE CROSS/BLUE SHIELD

## 2014-11-11 DIAGNOSIS — K5669 Other intestinal obstruction: Secondary | ICD-10-CM

## 2014-11-11 LAB — BASIC METABOLIC PANEL
Anion gap: 5 (ref 5–15)
BUN: 7 mg/dL (ref 6–20)
CO2: 30 mmol/L (ref 22–32)
Calcium: 8.1 mg/dL — ABNORMAL LOW (ref 8.9–10.3)
Chloride: 102 mmol/L (ref 101–111)
Creatinine, Ser: 0.97 mg/dL (ref 0.44–1.00)
GFR calc Af Amer: >60 mL/min (ref >60)
GLUCOSE: 107 mg/dL — AB (ref 65–99)
POTASSIUM: 3.6 mmol/L (ref 3.5–5.1)
Sodium: 137 mmol/L (ref 135–145)

## 2014-11-11 LAB — CBC
HCT: 30 % — ABNORMAL LOW (ref 36.0–46.0)
Hemoglobin: 9.3 g/dL — ABNORMAL LOW (ref 12.0–15.0)
MCH: 25.1 pg — ABNORMAL LOW (ref 26.0–34.0)
MCHC: 31 g/dL (ref 30.0–36.0)
MCV: 81.1 fL (ref 78.0–100.0)
Platelets: 315 10*3/uL (ref 150–400)
RBC: 3.7 MIL/uL — ABNORMAL LOW (ref 3.87–5.11)
RDW: 16.7 % — ABNORMAL HIGH (ref 11.5–15.5)
WBC: 4.3 10*3/uL (ref 4.0–10.5)

## 2014-11-11 NOTE — Progress Notes (Signed)
Tiffany Mueller ZOX:[id number] DOB: Jan 21, 1956 DOA: 11/08/2014 PCP: Lenora Boys, MD  Brief narrative: 59 y/o ? htn, Bipolar, ADD, Anastomotic strict 2002, Htn s/p GOP 2003 Dr. Enzo Bi and multiple other surgeries in the past. She had a reversal of her gastric bypass in 2004 because of excessive vomiting and nausea She's never had an SBO before   Past medical history-As per Problem list Chart reviewed as below-   Consultants:  General surgery  Procedures:    Antibiotics:  None   Subjective   Passing stool flatus Drinking coffee asking to eat   Objective    Interim History:   Telemetry:    Objective: Filed Vitals:   11/10/14 1430 11/10/14 2115 11/11/14 0529 11/11/14 1453  BP: 138/57 148/62 143/58 153/68  Pulse: 65 69 67 63  Temp: 97.9 F (36.6 C) 99.1 F (37.3 C) 98.4 F (36.9 C) 98.3 F (36.8 C)  TempSrc: Oral Oral Oral Oral  Resp: 16 18 18 16   Height:      Weight:   64.184 kg (141 lb 8 oz)   SpO2: 98% 100% 98% 100%    Intake/Output Summary (Last 24 hours) at 11/11/14 1549 Last data filed at 11/11/14 0852  Gross per 24 hour  Intake    243 ml  Output      0 ml  Net    243 ml    Exam:  EOMI pleasant no apparent distress mild pallor no icterus no lymphadenopathy S1-S2 no murmur rub or gallop Abdomen soft slightly tender in right lower quadrant no rebound Chest is clear no lower extremity edema   Data Reviewed: Basic Metabolic Panel:  Recent Labs Lab 11/08/14 1606 11/09/14 0350 11/11/14 0503  NA 137 139 137  K 4.0 3.6 3.6  CL 104 106 102  CO2 25 27 30   GLUCOSE 114* 116* 107*  BUN 11 9 7   CREATININE 1.00 0.97 0.97  CALCIUM 9.3 8.3* 8.1*   Liver Function Tests:  Recent Labs Lab 11/08/14 1606 11/09/14 0350  AST 20 313*  ALT 16 116*  ALKPHOS 97 131*  BILITOT 1.2 1.9*  PROT 7.5 5.6*  ALBUMIN 4.5 3.4*    Recent Labs Lab 11/08/14 1606  LIPASE 14*   No results for input(s): AMMONIA in the last 168  hours. CBC:  Recent Labs Lab 11/08/14 1606 11/09/14 0350 11/11/14 0503  WBC 9.0 5.8 4.3  NEUTROABS 7.6  --   --   HGB 10.7* 9.2* 9.3*  HCT 35.8* 29.0* 30.0*  MCV 79.9 81.7 81.1  PLT 378 306 315   Cardiac Enzymes: No results for input(s): CKTOTAL, CKMB, CKMBINDEX, TROPONINI in the last 168 hours. BNP: Invalid input(s): POCBNP CBG: No results for input(s): GLUCAP in the last 168 hours.  No results found for this or any previous visit (from the past 240 hour(s)).   Studies:              All Imaging reviewed and is as per above notation   Scheduled Meds: . amLODipine  5 mg Oral Daily  . buPROPion  150 mg Oral Daily  . enoxaparin (LOVENOX) injection  40 mg Subcutaneous Q24H  . furosemide  40 mg Oral BID  . lip balm  1 application Topical BID  . saccharomyces boulardii  250 mg Oral BID  . sodium chloride  3 mL Intravenous Q12H  . venlafaxine XR  150 mg Oral Q breakfast   Continuous Infusions:     Assessment/Plan:  Active Problems:  Partial  small bowel structure--it is highly unlikely she has an ileitis and I think only one more day of antibiotics is appropriate in this setting as CT scan of abdomen and pelvis can overcall normal physiological changes as being infection Appreciate general surgery input Repeat x-ray 8/5=resolved SBO D/c tele, d/c Abx [unlikely any concern for intraabdominal infection] Walk on floor Off unit privileges May shower  Home in 24 hours if no other issues    History of gastric bypass 2001 -We're waiting on notes from [facility] medical where she had bypass -nursing aware    Never has had a colonoscopy -Will need 1  Depression Continue Effexor XR 150 daily continue Bupropion 150 daily  Htn Well controlled Continue Amlodipine 10 daily   Appt with PCP: Requested Code Status: Full code/DNR Family Communication: family + Disposition Plan: home ~24hr DVT prophylaxis: SCD Consultants:   Pleas Koch, MD  [facility] Pager  [phone] 11/11/2014, 3:49 PM    LOS: 2 days

## 2014-11-11 NOTE — Progress Notes (Signed)
Subjective: She looks fine, moves in bed well, tolerating full liquids, feels much better.  Objective: Vital signs in last 24 hours: Temp:  [97.9 F (36.6 C)-99.1 F (37.3 C)] 98.4 F (36.9 C) 11-12-22 0529) Pulse Rate:  [65-69] 67 2022-11-12 0529) Resp:  [16-18] 18 2022/11/12 0529) BP: (138-148)/(57-62) 143/58 mmHg Nov 12, 2022 0529) SpO2:  [98 %-100 %] 98 % Nov 12, 2022 0529) Weight:  [64.184 kg (141 lb 8 oz)] 64.184 kg (141 lb 8 oz) 11-12-22 0529) Last BM Date: 11/08/14 1080 PO 1000 urine recorded 1 Bm recorded Diet: full liquids yesterday at 1630 hrs. Afebrile, VSS Labs [facility] Film is better she has contrast in left colon today Intake/Output from previous day: 08/04 0701 - November 12, 2022 0700 In: 1380 [P.O.:1080; IV Piggyback:300] Out: 1001 [Urine:1000; Stool:1] Intake/Output this shift: Total I/O In: 3 [I.V.:3] Out: -   General appearance: alert, cooperative and no distress GI: soft, minimal tenderness, she says it's much better.  Lab Results:   Recent Labs  11/09/14 0350 11/12/2014 0503  WBC 5.8 4.3  HGB 9.2* 9.3*  HCT 29.0* 30.0*  PLT 306 315    BMET  Recent Labs  11/09/14 0350 2014/11/12 0503  NA 139 137  K 3.6 3.6  CL 106 102  CO2 27 30  GLUCOSE 116* 107*  BUN 9 7  CREATININE 0.97 0.97  CALCIUM 8.3* 8.1*   PT/INR No results for input(s): LABPROT, INR in the last 72 hours.   Recent Labs Lab 11/08/14 1606 11/09/14 0350  AST 20 313*  ALT 16 116*  ALKPHOS 97 131*  BILITOT 1.2 1.9*  PROT 7.5 5.6*  ALBUMIN 4.5 3.4*     Lipase     Component Value Date/Time   LIPASE 14* 11/08/2014 1606     Studies/Results: Dg Abd 2 Views  11/12/2014   CLINICAL DATA:  Small bowel obstruction, history hypertension  EXAM: ABDOMEN - 2 VIEW  COMPARISON:  11/10/2014  FINDINGS: GI contrast present in the colon.  Normal bowel gas pattern.  No definite bowel dilatation or wall thickening.  Bones demineralized.  Surgical clips in the RIGHT pelvis, upper pelvis centrally, and bilaterally in the  upper quadrants.  Lung bases clear.  Thoracolumbar scoliosis.  IMPRESSION: Nonobstructive bowel gas pattern with no bowel dilatation seen.   Electronically Signed   By: Ulyses Southward M.D.   On: 11/12/2014 10:15   Dg Abd 2 Views  11/10/2014   CLINICAL DATA:  Small-bowel obstruction.  EXAM: ABDOMEN - 2 VIEW  COMPARISON:  11/09/2014.  FINDINGS: Soft tissue structures are unremarkable. Surgical clips sutures noted over the abdomen and pelvis. Oral contrast noted throughout the colon. Interim partial resolution of small bowel distention. No free air. Small right pleural effusion. Thoracolumbar spine scoliosis.  IMPRESSION: 1. Interim partial resolution of small bowel distention. Oral contrast noted throughout the colon.  2.  Small right pleural effusion.   Electronically Signed   By: Maisie Fus  Register   On: 11/10/2014 08:43    Medications: . amLODipine  5 mg Oral Daily  . buPROPion  150 mg Oral Daily  . enoxaparin (LOVENOX) injection  40 mg Subcutaneous Q24H  . furosemide  40 mg Oral BID  . lip balm  1 application Topical BID  . saccharomyces boulardii  250 mg Oral BID  . sodium chloride  3 mL Intravenous Q12H  . 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 Andrey Campanile on switching to PO antibiotics and advancing diet.  She is asking if she can go home.  Will discuss with Dr. Andrey Campanile.  She also ask if she had 2 stomachs, I told her no but Dr. Andrey Campanile would have to review the revision from 2003.   LOS: 2 days    Crystal Scarberry 11/11/2014

## 2014-11-12 MED ORDER — SACCHAROMYCES BOULARDII 250 MG PO CAPS: 250.0000 mg | ORAL_CAPSULE | Freq: Two times a day (BID) | ORAL | Status: DC

## 2014-11-12 NOTE — Progress Notes (Signed)
Patient ID: Tiffany Mueller, female   DOB: June 30, 1955, 59 y.o.   MRN: [id number]  General Surgery [facility]  Subjective: Patient up in chair, denies pain, "discomfort" this AM.  Passing flatus, no BM.  Tolerating diet.  Objective: Vital signs in last 24 hours: Temp:  [98 F (36.7 C)-98.9 F (37.2 C)] 98.9 F (37.2 C) (08/06 0504) Pulse Rate:  [63-69] 69 (08/06 0504) Resp:  [16] 16 (08/06 0504) BP: (126-153)/(59-69) 126/69 mmHg (08/06 0504) SpO2:  [99 %-100 %] 99 % (08/06 0504) Weight:  [64.456 kg (142 lb 1.6 oz)] 64.456 kg (142 lb 1.6 oz) (08/06 0504) Last BM Date: 11/08/14  Intake/Output from previous day: 2022/12/09 0701 - 08/06 0700 In: 603 [P.O.:600; I.V.:3] Out: 2300 [Urine:2300] Intake/Output this shift: Total I/O In: 360 [P.O.:360] Out: -   Physical Exam: HEENT - sclerae clear, mucous membranes moist Neck - soft Chest - clear bilaterally Cor - RRR Abdomen - soft without distension, mild tenderness mid abdomen, no mass; active BS present Ext - no edema, non-tender Neuro - alert & oriented, no focal deficits  Lab Results:   Recent Labs  12-09-2014 0503  WBC 4.3  HGB 9.3*  HCT 30.0*  PLT 315   BMET  Recent Labs  2014/12/09 0503  NA 137  K 3.6  CL 102  CO2 30  GLUCOSE 107*  BUN 7  CREATININE 0.97  CALCIUM 8.1*   PT/INR No results for input(s): LABPROT, INR in the last 72 hours. Comprehensive Metabolic Panel:    Component Value Date/Time   NA 137 2014/12/09 0503   NA 139 11/09/2014 0350   K 3.6 12-09-2014 0503   K 3.6 11/09/2014 0350   CL 102 12/09/2014 0503   CL 106 11/09/2014 0350   CO2 30 12-09-2014 0503   CO2 27 11/09/2014 0350   BUN 7 2014/12/09 0503   BUN 9 11/09/2014 0350   CREATININE 0.97 December 09, 2014 0503   CREATININE 0.97 11/09/2014 0350   GLUCOSE 107* December 09, 2014 0503   GLUCOSE 116* 11/09/2014 0350   CALCIUM 8.1* 09-Dec-2014 0503   CALCIUM 8.3* 11/09/2014 0350   AST 313* 11/09/2014 0350   AST 20 11/08/2014 1606   ALT 116* 11/09/2014 0350   ALT 16 11/08/2014 1606   ALKPHOS 131* 11/09/2014 0350   ALKPHOS 97 11/08/2014 1606   BILITOT 1.9* 11/09/2014 0350   BILITOT 1.2 11/08/2014 1606   PROT 5.6* 11/09/2014 0350   PROT 7.5 11/08/2014 1606   ALBUMIN 3.4* 11/09/2014 0350   ALBUMIN 4.5 11/08/2014 1606    Studies/Results: Dg Abd 2 Views  Dec 09, 2014   CLINICAL DATA:  Small bowel obstruction, history hypertension  EXAM: ABDOMEN - 2 VIEW  COMPARISON:  11/10/2014  FINDINGS: GI contrast present in the colon.  Normal bowel gas pattern.  No definite bowel dilatation or wall thickening.  Bones demineralized.  Surgical clips in the RIGHT pelvis, upper pelvis centrally, and bilaterally in the upper quadrants.  Lung bases clear.  Thoracolumbar scoliosis.  IMPRESSION: Nonobstructive bowel gas pattern with no bowel dilatation seen.   Electronically Signed   By: Ulyses Southward M.D.   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 Gaynelle Adu  Will sign off - OK for discharge from surgical standpoint.  Velora Heckler, MD, [facility] Office: [phone]   Mariea Mcmartin Judie Petit 11/12/2014

## 2014-11-12 NOTE — Discharge Summary (Signed)
Physician Discharge Summary  Tiffany Mueller UJW:[id number] DOB: 06-17-1955 DOA: 11/08/2014  PCP: Lenora Boys, MD  Admit date: 11/08/2014 Discharge date: 11/12/2014  Time spent: 35 minutes  Recommendations for Outpatient Follow-up:  1. Needs Bmet and CBc 1 week 2. Added florastor this admission 3. Consider Colonoscopy as has never been done b4 4. Consider OP follow up Dr. Andrey Campanile at [facility] if furthe rissues  Discharge Diagnoses:  Active Problems:   Ileitis   History of gastric bypass 2001   Abdominal pain   Never has had a colonoscopy   Discharge Condition: good  Diet recommendation: low residue  [facility] Weights   11/10/14 0535 11/11/14 0529 11/12/14 0504  Weight: 65.227 kg (143 lb 12.8 oz) 64.184 kg (141 lb 8 oz) 64.456 kg (142 lb 1.6 oz)    History of present illness:  59 y/o ? htn, Bipolar, ADD, Anastomotic strict 2002, Htn s/p GOP 2003 Dr. Enzo Bi and multiple other surgeries in the past. She had a reversal of her gastric bypass in 2004 because of excessive vomiting and nausea She's never had an SBO before OP note reviewed per Dr. Andrey Campanile from 2004 exp lap, loa, ovarian cystectomies, distal gastric pouch resection, revision of gastrojej - the surgeon discussed resecting the distal gastric pouch due to it being very thickened. This is unclear to me exactly what this means. It almost sounds like she resected the distal gastric remnant however there is no mention of anastomoses. She then revise the gastrojejunostomy due to the patient's dysphasia by opening it up longitudinally and then closing it transversely She resolved with conservative management only and on day of d/c was able to tolerate full meals  Consultations:  Gen surgery-Dr. Andrey Campanile  Discharge Exam: Filed Vitals:   11/12/14 0504  BP: 126/69  Pulse: 69  Temp: 98.9 F (37.2 C)  Resp: 16    General: eomi, ncat reading a novella Cardiovascular: s1 s 2no m/r/g Respiratory: clear  Discharge  Instructions   Discharge Instructions    Diet - low sodium heart healthy    Complete by:  As directed      Discharge instructions    Complete by:  As directed   Eat a low residue diet Continue all your meds as per the d/c instructions I have added a probiotic for you to take for a short period of time Best of luck!     Increase activity slowly    Complete by:  As directed           Current Discharge Medication List    START taking these medications   Details  saccharomyces boulardii (FLORASTOR) 250 MG capsule Take 1 capsule (250 mg total) by mouth 2 (two) times daily. Qty: 60 capsule, Refills: 0      CONTINUE these medications which have NOT CHANGED   Details  amLODipine (NORVASC) 5 MG tablet Take 5 mg by mouth daily.    amphetamine-dextroamphetamine (ADDERALL) 30 MG tablet Take 30 mg by mouth 2 (two) times daily.    buPROPion (WELLBUTRIN XL) 150 MG 24 hr tablet TAKE 1 TABLET IN THE MORNING ONCE A DAY ORALLY 30 DAY(S) Refills: 5    furosemide (LASIX) 40 MG tablet Take 40 mg by mouth 2 (two) times daily.    venlafaxine XR (EFFEXOR-XR) 150 MG 24 hr capsule Take 150 mg by mouth daily with breakfast.      STOP taking these medications     naproxen sodium (ANAPROX) 220 MG tablet  Allergies  Allergen Reactions  . Cephalosporins Itching      The results of significant diagnostics from this hospitalization (including imaging, microbiology, ancillary and laboratory) are listed below for reference.    Significant Diagnostic Studies: Ct Abdomen Pelvis W Contrast  11/08/2014   CLINICAL DATA:  Acute onset of lower abdominal pain, nausea and vomiting. Initial encounter.  EXAM: CT ABDOMEN AND PELVIS WITH CONTRAST  TECHNIQUE: Multidetector CT imaging of the abdomen and pelvis was performed using the standard protocol following bolus administration of intravenous contrast.  CONTRAST:  OMNIPAQUE IOHEXOL 300 MG/ML  SOLN  COMPARISON:  None.  FINDINGS: The visualized  lung bases are clear.  Trace ascites is noted surrounding the liver and spleen, and tracking along the paracolic gutters into the pelvis. The etiology of this fluid is unclear, but may be related to the small bowel process described below.  Diffuse prominence of the intrahepatic biliary ducts, and dilatation of the common bile duct to 1.9 cm, reflects prior cholecystectomy. This may simply reflect the patient's baseline status post cholecystectomy, though would correlate for any evidence of post-cholecystectomy syndrome.  The liver and spleen are otherwise unremarkable. The pancreas and adrenal glands are within normal limits.  Apparent parenchymal striations within both kidneys are nonspecific and may reflect mild scarring, though would correlate clinically for any evidence of mild pyelonephritis. There is no evidence of hydronephrosis. No renal or ureteral stones are seen. No perinephric stranding is appreciated.  There is distention of small-bowel loops to 4.1 cm in maximal diameter, with underlying postoperative change. There is mild fecalization of the distal ileum, though it remains borderline normal in caliber. Wall thickening is noted at the terminal ileum, with mild soft tissue inflammation about the ileocecal junction. This may reflect infectious or inflammatory ileitis with small bowel dysmotility, or possibly partial obstruction due to underlying adhesion, given the patient's prior surgery.  The stomach is within normal limits. No acute vascular abnormalities are seen. Mild scattered calcification is noted along the abdominal aorta and its branches.  The appendix is not definitely seen. There is no evidence for appendicitis. The colon is partially filled with stool and is unremarkable in appearance, aside from the mild apparent soft tissue inflammation about the ileocecal junction and cecum.  The bladder is mildly distended and grossly unremarkable. The patient is status post hysterectomy. A 2.3 cm cyst  is noted at the left side of the vaginal cuff. No suspicious adnexal masses are seen. No inguinal lymphadenopathy is seen.  No acute osseous abnormalities are identified. Mild facet disease is noted at the lower lumbar spine.  IMPRESSION: 1. Distention of small-bowel loops to 4.1 cm in maximal diameter, with underlying postoperative change. Mild fecalization of the distal ileum, with wall thickening at the terminal ileum and mild soft tissue inflammation about the ileocecal junction and cecum. This may reflect an infectious or inflammatory ileitis, with small bowel dysmotility, or possibly partial obstruction due to an underlying adhesion, given the patient's prior surgery. 2. Trace ascites noted within the abdomen and pelvis. 3. Diffuse prominence of the intrahepatic biliary ducts, and dilatation of the common bile duct to 1.9 cm, reflecting prior cholecystectomy. This may simply reflect the patient's baseline status post cholecystectomy, though would correlate clinically for any evidence of post-cholecystectomy syndrome. 4. Apparent parenchymal striations within both kidneys are nonspecific and may reflect mild scarring, though would correlate clinically for any evidence of mild pyelonephritis. 5. Mild scattered calcification along the abdominal aorta and its branches. 6. 2.3 cm  cyst noted at the left side of the vaginal cuff.   Electronically Signed   By: Roanna Raider M.D.   On: 11/08/2014 18:10   Dg Abd 2 Views  11/11/2014   CLINICAL DATA:  Small bowel obstruction, history hypertension  EXAM: ABDOMEN - 2 VIEW  COMPARISON:  11/10/2014  FINDINGS: GI contrast present in the colon.  Normal bowel gas pattern.  No definite bowel dilatation or wall thickening.  Bones demineralized.  Surgical clips in the RIGHT pelvis, upper pelvis centrally, and bilaterally in the upper quadrants.  Lung bases clear.  Thoracolumbar scoliosis.  IMPRESSION: Nonobstructive bowel gas pattern with no bowel dilatation seen.    Electronically Signed   By: Ulyses Southward M.D.   On: 11/11/2014 10:15   Dg Abd 2 Views  11/10/2014   CLINICAL DATA:  Small-bowel obstruction.  EXAM: ABDOMEN - 2 VIEW  COMPARISON:  11/09/2014.  FINDINGS: Soft tissue structures are unremarkable. Surgical clips sutures noted over the abdomen and pelvis. Oral contrast noted throughout the colon. Interim partial resolution of small bowel distention. No free air. Small right pleural effusion. Thoracolumbar spine scoliosis.  IMPRESSION: 1. Interim partial resolution of small bowel distention. Oral contrast noted throughout the colon.  2.  Small right pleural effusion.   Electronically Signed   By: Maisie Fus  Register   On: 11/10/2014 08:43   Dg Abd 2 Views  11/09/2014   CLINICAL DATA:  Lower abdominal pain bloating and nausea, history of ileitis and bowel obstruction requiring surgery in 2004  EXAM: ABDOMEN - 2 VIEW  COMPARISON:  Abdominal and pelvic CT scan of November 08, 2014  FINDINGS: There remain loops of moderately distended gas and fluid-filled small bowel in the mid and lower abdomen. There is contrast within mildly distended small bowel loops in the pelvis. The colonic gas and stool pattern is unremarkable. There are numerous surgical clips in the mid/upper abdomen, the right upper quadrant, the right mid abdomen, and in the pelvis.  There is gentle S shaped thoracolumbar scoliosis. The lung bases are clear. There are mild degenerative changes of both hips.  IMPRESSION: Findings consistent with a partial distal small bowel obstruction. There is no evidence of perforation.   Electronically Signed   By: David  [place] M.D.   On: 11/09/2014 09:10   Dg Abd 2 Views  11/08/2014   CLINICAL DATA:  Abdominal pain and nausea for 2 days. History of gastric bypass surgery.  EXAM: ABDOMEN - 2 VIEW  COMPARISON:  None.  FINDINGS: There is some scattered air and stool in the transverse colon. There are slightly dilated small bowel loops with air-fluid levels in the mid to lower  abdomen and left lower quadrant suspicious for early small bowel obstruction. No free air. The soft tissue shadows are maintained. Extensive surgical changes. The lung bases are clear. The bony structures are intact.  IMPRESSION: Plain film findings suggest an early or partial small bowel obstruction.   Electronically Signed   By: Rudie Meyer M.D.   On: 11/08/2014 16:33    Microbiology: No results found for this or any previous visit (from the past 240 hour(s)).   Labs: Basic Metabolic Panel:  Recent Labs Lab 11/08/14 1606 11/09/14 0350 11/11/14 0503  NA 137 139 137  K 4.0 3.6 3.6  CL 104 106 102  CO2 25 27 30   GLUCOSE 114* 116* 107*  BUN 11 9 7   CREATININE 1.00 0.97 0.97  CALCIUM 9.3 8.3* 8.1*   Liver Function Tests:  Recent Labs  Lab 11/08/14 1606 11/09/14 0350  AST 20 313*  ALT 16 116*  ALKPHOS 97 131*  BILITOT 1.2 1.9*  PROT 7.5 5.6*  ALBUMIN 4.5 3.4*    Recent Labs Lab 11/08/14 1606  LIPASE 14*   No results for input(s): AMMONIA in the last 168 hours. CBC:  Recent Labs Lab 11/08/14 1606 11/09/14 0350 11/11/14 0503  WBC 9.0 5.8 4.3  NEUTROABS 7.6  --   --   HGB 10.7* 9.2* 9.3*  HCT 35.8* 29.0* 30.0*  MCV 79.9 81.7 81.1  PLT 378 306 315   Cardiac Enzymes: No results for input(s): CKTOTAL, CKMB, CKMBINDEX, TROPONINI in the last 168 hours. BNP: BNP (last 3 results) No results for input(s): BNP in the last 8760 hours.  ProBNP (last 3 results) No results for input(s): PROBNP in the last 8760 hours.  CBG: No results for input(s): GLUCAP in the last 168 hours.     SignedRhetta Mura  [facility] 11/12/2014, 10:09 AM

## 2014-11-12 NOTE — Progress Notes (Signed)
Completed D/C teaching with patient. Answered all questions. Pt will be D/C home with family in stable condition. Pt is currently waiting for ride.

## 2015-06-30 ENCOUNTER — Emergency Department (HOSPITAL_BASED_OUTPATIENT_CLINIC_OR_DEPARTMENT_OTHER)
Admission: EM | Admit: 2015-06-30 | Discharge: 2015-06-30 | Disposition: A | Discharge status: Home / Self Care | Payer: 59 | Attending: Emergency Medicine | Admitting: Emergency Medicine

## 2015-06-30 ENCOUNTER — Emergency Department (HOSPITAL_BASED_OUTPATIENT_CLINIC_OR_DEPARTMENT_OTHER): Payer: 59

## 2015-06-30 ENCOUNTER — Encounter (HOSPITAL_BASED_OUTPATIENT_CLINIC_OR_DEPARTMENT_OTHER): Payer: Self-pay | Admitting: *Deleted

## 2015-06-30 DIAGNOSIS — Y93H2 Activity, gardening and landscaping: Secondary | ICD-10-CM | POA: Diagnosis not present

## 2015-06-30 DIAGNOSIS — S6992XA Unspecified injury of left wrist, hand and finger(s), initial encounter: Secondary | ICD-10-CM | POA: Diagnosis present

## 2015-06-30 DIAGNOSIS — Z8719 Personal history of other diseases of the digestive system: Secondary | ICD-10-CM | POA: Diagnosis not present

## 2015-06-30 DIAGNOSIS — Y92007 Garden or yard of unspecified non-institutional (private) residence as the place of occurrence of the external cause: Secondary | ICD-10-CM | POA: Diagnosis not present

## 2015-06-30 DIAGNOSIS — Z79899 Other long term (current) drug therapy: Secondary | ICD-10-CM | POA: Insufficient documentation

## 2015-06-30 DIAGNOSIS — S52592A Other fractures of lower end of left radius, initial encounter for closed fracture: Secondary | ICD-10-CM | POA: Diagnosis not present

## 2015-06-30 DIAGNOSIS — F329 Major depressive disorder, single episode, unspecified: Secondary | ICD-10-CM | POA: Insufficient documentation

## 2015-06-30 DIAGNOSIS — F909 Attention-deficit hyperactivity disorder, unspecified type: Secondary | ICD-10-CM | POA: Diagnosis not present

## 2015-06-30 DIAGNOSIS — I10 Essential (primary) hypertension: Secondary | ICD-10-CM | POA: Insufficient documentation

## 2015-06-30 DIAGNOSIS — W1839XA Other fall on same level, initial encounter: Secondary | ICD-10-CM | POA: Diagnosis not present

## 2015-06-30 DIAGNOSIS — Y998 Other external cause status: Secondary | ICD-10-CM | POA: Diagnosis not present

## 2015-06-30 DIAGNOSIS — F17210 Nicotine dependence, cigarettes, uncomplicated: Secondary | ICD-10-CM | POA: Insufficient documentation

## 2015-06-30 DIAGNOSIS — S52502A Unspecified fracture of the lower end of left radius, initial encounter for closed fracture: Secondary | ICD-10-CM

## 2015-06-30 MED ORDER — HYDROCODONE-ACETAMINOPHEN 5-325 MG PO TABS: 1.0000 | ORAL_TABLET | Freq: Four times a day (QID) | ORAL | Status: DC | PRN

## 2015-06-30 MED FILL — HYDROCODON-APAP 5-325: 5-325 | 3 days supply | Qty: 20 | Fill #0

## 2015-06-30 NOTE — ED Notes (Signed)
Ice pack applied.

## 2015-06-30 NOTE — Discharge Instructions (Signed)
Ice for 20 minutes every 2 hours while awake for the next several days.  Wear the wrist splint as applied until followed up by Dr. Amanda PeaGramig.  His office contact information has been provided in this discharge summary for you to call and arrange this appointment. This appointment should be early next week.   Wrist Fracture A wrist fracture is a break or crack in one of the bones of your wrist. Your wrist is made up of eight small bones at the palm of your hand (carpal bones) and two long bones that make up your forearm (radius and ulna). The goal of treatment is to hold the injured bone in place while it heals. Surgery may or may not be needed to care for your injured wrist.  HOME CARE  Keep your injured wrist raised (elevated). Move your fingers as much as you can.  Do not put pressure on any part of your cast or splint. It may break.  Use a plastic bag to protect your cast or splint from water while bathing or showering. Do not lower your cast or splint into water.  Take medicines only as told by your doctor.  Keep your cast or splint clean and dry. If it gets wet, damaged, or suddenly feels too tight, tell your doctor right away.  Do not use any tobacco products including cigarettes, chewing tobacco, or electronic cigarettes. Tobacco can slow bone healing. If you need help quitting, ask your doctor.  Keep all follow-up visits as told by your doctor. This is important.  Ask your doctor if you should take supplements of calcium and vitamins C and D. GET HELP IF:   Your cast or splint is damaged, breaks, or gets wet.  You have a fever.  You have chills.  You have very bad pain that does not go away.  You have more swelling (inflammation) than before the cast was put on. GET HELP RIGHT AWAY IF:   Your hand or fingernails on the injured arm turn blue or gray, or feel cold or numb.  You lose some feeling in the fingers of your injured arm. MAKE SURE YOU:   Understand these  instructions.  Will watch your condition.  Will get help right away if you are not doing well or get worse.   This information is not intended to replace advice given to you by your health care provider. Make sure you discuss any questions you have with your health care provider.   Document Released: 09/11/2007 Document Revised: 04/15/2014 Document Reviewed: 10/07/2011 Elsevier Interactive Patient Education [employer]2016 [employer]

## 2015-06-30 NOTE — ED Notes (Addendum)
Per pt report at 9 am fell in the garden and fell onto lt arm. Per pt report drove herself to urgent care was told it was broken w/o xray thus sent here.

## 2015-06-30 NOTE — ED Notes (Signed)
EMT at bedside applying splint 

## 2015-06-30 NOTE — ED Notes (Signed)
Patient transported to X-ray and returned 

## 2015-06-30 NOTE — ED Provider Notes (Signed)
CSN: [id number]     Arrival date & time 06/30/15  1228 History   First MD Initiated Contact with Patient 06/30/15 1252     Chief Complaint  Patient presents with  . Wrist Pain     (Consider location/radiation/quality/duration/timing/severity/associated sxs/prior Treatment) HPI Comments: Patient is a 60 year old female who presents with complaints of left wrist pain. She was working in the garden when she stood up, lost her balance, tripped over a rock, and landed on her outstretched hand.  Patient is a 60 y.o. female presenting with wrist pain. The history is provided by the patient.  Wrist Pain This is a new problem. The current episode started 1 to 2 hours ago. The problem occurs constantly. The problem has not changed since onset.Exacerbated by: Movement and palpation. Nothing relieves the symptoms. She has tried nothing for the symptoms. The treatment provided no relief.    Past Medical History  Diagnosis Date  . Osteoporosis   . Hypertension   . Depression   . ADD (attention deficit disorder)   . Anastomotic stricture of gastrojejunostomy 2002    EGD dilation   Past Surgical History  Procedure Laterality Date  . Wrist surgery    . Gastric bypass  2001    [facility]  . Appendectomy    . Eye surgery      x4  . Abdominal hysterectomy  2007  . Laparoscopic oopherectomy    . Esophagogastroduodenoscopy  2002    Dr Randa Evens.  GJ stricture  . Cholecystectomy     Family History  Problem Relation Age of Onset  . Alzheimer's disease Mother   . Atrial fibrillation Father    Social History  Substance Use Topics  . Smoking status: Current Every Day Smoker -- 0.50 packs/day for 40 years    Types: Cigarettes  . Smokeless tobacco: None  . Alcohol Use: 4.2 oz/week    7 Standard drinks or equivalent per week     Comment: 1 glass wine nightly   OB History    No data available     Review of Systems  All other systems reviewed and are negative.     Allergies   Cephalosporins  Home Medications   Prior to Admission medications   Medication Sig Start Date End Date Taking? Authorizing Provider  amLODipine (NORVASC) 5 MG tablet Take 5 mg by mouth daily.   Yes Historical Provider, MD  amphetamine-dextroamphetamine (ADDERALL) 30 MG tablet Take 30 mg by mouth 2 (two) times daily.   Yes Historical Provider, MD  buPROPion (WELLBUTRIN XL) 150 MG 24 hr tablet TAKE 1 TABLET IN THE MORNING ONCE A DAY ORALLY 30 DAY(S) 10/26/14  Yes Historical Provider, MD  furosemide (LASIX) 40 MG tablet Take 40 mg by mouth 2 (two) times daily.   Yes Historical Provider, MD  venlafaxine XR (EFFEXOR-XR) 150 MG 24 hr capsule Take 150 mg by mouth daily with breakfast.   Yes Historical Provider, MD   BP 152/86 mmHg  Pulse 96  Temp(Src) 98.8 F (37.1 C) (Oral)  Resp 18  Ht  (1.575 m)  Wt 142 lb 7 oz (64.609 kg)  BMI 26.05 kg/m2  SpO2 99% Physical Exam  Constitutional: She is oriented to person, place, and time. She appears well-developed and well-nourished. No distress.  HENT:  Head: Normocephalic and atraumatic.  Neck: Normal range of motion. Neck supple.  Musculoskeletal:  The left wrist has swelling and tenderness over the dorsal aspect. Distal cap refill is brisk. Sensation and motor are intact  throughout the entire hand. She has pain with range of motion of the wrist.  Neurological: She is alert and oriented to person, place, and time.  Skin: Skin is warm and dry. She is not diaphoretic.  Nursing note and vitals reviewed.   ED Course  Procedures (including critical care time) Labs Review Labs Reviewed - No data to display  Imaging Review No results found. I have personally reviewed and evaluated these images and lab results as part of my medical decision-making.   EKG Interpretation None      MDM   Final diagnoses:  None    X-rays reveal a nondisplaced distal radius fracture. This will be treated with splinting and follow-up with orthopedics.  Dr. Amanda PeaGramig is on-call for hand surgery and the follow-up information for his office will be provided for the patient follow-up with, ideally on Monday.    Geoffery Lyonsouglas Teran Daughenbaugh, MD 06/30/15 [phone]

## 2015-07-03 ENCOUNTER — Other Ambulatory Visit: Payer: Self-pay | Admitting: Orthopedic Surgery

## 2015-07-03 ENCOUNTER — Encounter (HOSPITAL_COMMUNITY): Payer: Self-pay | Admitting: *Deleted

## 2015-07-03 NOTE — Progress Notes (Signed)
Pt denies SOB, chest pain, and being under the care of a cardiologist. Pt stated that a stress test was completed 5-6 years ago but denies having a cardiac cath. Pt denies having a chest x ray and EKG within the last year. Pt denies having l;abs drawn within the last 2 weeks. Pt made aware to stop  taking Aspirin,vitamins, fish oil and herbal medications. Do not take any NSAIDs ie: Ibuprofen, Advil, Naproxen, BC and Goody Powder or any medication containing Aspirin. Pt stated that MD advised her that she can eat breakfast the morning of surgery but the cut off time is 7:30 AM. Pt verbalized understanding of all pre-op instructions.

## 2015-07-04 ENCOUNTER — Ambulatory Visit (HOSPITAL_COMMUNITY): Payer: 59 | Admitting: Anesthesiology

## 2015-07-04 ENCOUNTER — Encounter (HOSPITAL_COMMUNITY): Payer: Self-pay | Admitting: *Deleted

## 2015-07-04 ENCOUNTER — Observation Stay (HOSPITAL_COMMUNITY)
Admission: RE | Admit: 2015-07-04 | Discharge: 2015-07-06 | Disposition: A | Discharge status: Home / Self Care | Payer: 59 | Source: Ambulatory Visit | Attending: Orthopedic Surgery | Admitting: Orthopedic Surgery

## 2015-07-04 ENCOUNTER — Ambulatory Visit (HOSPITAL_COMMUNITY): Payer: 59

## 2015-07-04 ENCOUNTER — Encounter (HOSPITAL_COMMUNITY)
Admission: RE | Disposition: A | Discharge status: Home / Self Care | Payer: Self-pay | Source: Ambulatory Visit | Attending: Orthopedic Surgery

## 2015-07-04 DIAGNOSIS — T1490XA Injury, unspecified, initial encounter: Secondary | ICD-10-CM

## 2015-07-04 DIAGNOSIS — S52502A Unspecified fracture of the lower end of left radius, initial encounter for closed fracture: Secondary | ICD-10-CM | POA: Diagnosis not present

## 2015-07-04 DIAGNOSIS — F329 Major depressive disorder, single episode, unspecified: Secondary | ICD-10-CM | POA: Insufficient documentation

## 2015-07-04 DIAGNOSIS — X58XXXA Exposure to other specified factors, initial encounter: Secondary | ICD-10-CM | POA: Insufficient documentation

## 2015-07-04 DIAGNOSIS — R531 Weakness: Secondary | ICD-10-CM | POA: Insufficient documentation

## 2015-07-04 DIAGNOSIS — F17210 Nicotine dependence, cigarettes, uncomplicated: Secondary | ICD-10-CM | POA: Insufficient documentation

## 2015-07-04 DIAGNOSIS — G5602 Carpal tunnel syndrome, left upper limb: Principal | ICD-10-CM | POA: Insufficient documentation

## 2015-07-04 DIAGNOSIS — F988 Other specified behavioral and emotional disorders with onset usually occurring in childhood and adolescence: Secondary | ICD-10-CM | POA: Diagnosis not present

## 2015-07-04 DIAGNOSIS — I10 Essential (primary) hypertension: Secondary | ICD-10-CM | POA: Insufficient documentation

## 2015-07-04 HISTORY — PX: CARPAL TUNNEL RELEASE: SHX101

## 2015-07-04 HISTORY — DX: Anxiety disorder, unspecified: F41.9

## 2015-07-04 HISTORY — DX: Unspecified osteoarthritis, unspecified site: M19.90

## 2015-07-04 HISTORY — DX: Carpal tunnel syndrome, unspecified upper limb: G56.00

## 2015-07-04 HISTORY — PX: OPEN REDUCTION INTERNAL FIXATION (ORIF) DISTAL RADIAL FRACTURE: SHX5989

## 2015-07-04 LAB — CBC
HEMATOCRIT: 37.6 % (ref 36.0–46.0)
HEMOGLOBIN: 11.8 g/dL — AB (ref 12.0–15.0)
MCH: 28.4 pg (ref 26.0–34.0)
MCHC: 31.4 g/dL (ref 30.0–36.0)
MCV: 90.6 fL (ref 78.0–100.0)
Platelets: 319 10*3/uL (ref 150–400)
RBC: 4.15 MIL/uL (ref 3.87–5.11)
RDW: 14.2 % (ref 11.5–15.5)
WBC: 7 10*3/uL (ref 4.0–10.5)

## 2015-07-04 LAB — COMPREHENSIVE METABOLIC PANEL
ALBUMIN: 4 g/dL (ref 3.5–5.0)
ALT: 15 U/L (ref 14–54)
ANION GAP: 9 (ref 5–15)
AST: 21 U/L (ref 15–41)
Alkaline Phosphatase: 93 U/L (ref 38–126)
BUN: 11 mg/dL (ref 6–20)
CHLORIDE: 109 mmol/L (ref 101–111)
CO2: 23 mmol/L (ref 22–32)
Calcium: 8.9 mg/dL (ref 8.9–10.3)
Creatinine, Ser: 1.04 mg/dL — ABNORMAL HIGH (ref 0.44–1.00)
GFR calc Af Amer: >60 mL/min (ref >60)
GFR calc non Af Amer: 57 mL/min — ABNORMAL LOW (ref >60)
GLUCOSE: 108 mg/dL — AB (ref 65–99)
POTASSIUM: 4.3 mmol/L (ref 3.5–5.1)
SODIUM: 141 mmol/L (ref 135–145)
Total Bilirubin: 1 mg/dL (ref 0.3–1.2)
Total Protein: 6.6 g/dL (ref 6.5–8.1)

## 2015-07-04 SURGERY — OPEN REDUCTION INTERNAL FIXATION (ORIF) DISTAL RADIUS FRACTURE
Anesthesia: Regional | Laterality: Left

## 2015-07-04 MED ORDER — CHLORHEXIDINE GLUCONATE 4 % EX LIQD: 60.0000 mL | Freq: Once | CUTANEOUS | Status: DC

## 2015-07-04 MED ORDER — VENLAFAXINE HCL ER 75 MG PO CP24
150.0000 mg | ORAL_CAPSULE | Freq: Every day | ORAL | Status: DC
  Administered 2015-07-05 – 2015-07-06 (×2): 150 mg via ORAL
  Filled 2015-07-04 (×2): qty 2

## 2015-07-04 MED ORDER — MIDAZOLAM HCL 2 MG/2ML IJ SOLN
INTRAMUSCULAR | Status: AC
  Filled 2015-07-04: qty 2

## 2015-07-04 MED ORDER — ONDANSETRON HCL 4 MG/2ML IJ SOLN
INTRAMUSCULAR | Status: DC | PRN
  Administered 2015-07-04: 4 mg via INTRAVENOUS

## 2015-07-04 MED ORDER — FENTANYL CITRATE (PF) 250 MCG/5ML IJ SOLN
INTRAMUSCULAR | Status: AC
  Filled 2015-07-04: qty 5

## 2015-07-04 MED ORDER — LACTATED RINGERS IV SOLN: INTRAVENOUS | Status: DC

## 2015-07-04 MED ORDER — FUROSEMIDE 40 MG PO TABS
40.0000 mg | ORAL_TABLET | Freq: Two times a day (BID) | ORAL | Status: DC
Start: 2015-07-05 — End: 2015-07-06
  Administered 2015-07-05 – 2015-07-06 (×3): 40 mg via ORAL
  Filled 2015-07-04 (×3): qty 1

## 2015-07-04 MED ORDER — PROPOFOL 10 MG/ML IV BOLUS
INTRAVENOUS | Status: AC
  Filled 2015-07-04: qty 20

## 2015-07-04 MED ORDER — VANCOMYCIN HCL 10 G IV SOLR
1250.0000 mg | INTRAVENOUS | Status: AC
  Administered 2015-07-04: 1250 mg via INTRAVENOUS
  Filled 2015-07-04: qty 1250

## 2015-07-04 MED ORDER — AMPHETAMINE-DEXTROAMPHETAMINE 10 MG PO TABS
30.0000 mg | ORAL_TABLET | Freq: Two times a day (BID) | ORAL | Status: DC
Start: 2015-07-04 — End: 2015-07-06
  Administered 2015-07-05: 30 mg via ORAL
  Filled 2015-07-04 (×4): qty 3

## 2015-07-04 MED ORDER — MIDAZOLAM HCL 5 MG/5ML IJ SOLN
INTRAMUSCULAR | Status: DC | PRN
  Administered 2015-07-04 (×2): 1 mg via INTRAVENOUS

## 2015-07-04 MED ORDER — ROPIVACAINE HCL 5 MG/ML IJ SOLN
INTRAMUSCULAR | Status: DC | PRN
  Administered 2015-07-04: 25 mL via PERINEURAL

## 2015-07-04 MED ORDER — FENTANYL CITRATE (PF) 100 MCG/2ML IJ SOLN
INTRAMUSCULAR | Status: DC | PRN
  Administered 2015-07-04: 50 ug via INTRAVENOUS

## 2015-07-04 MED ORDER — MORPHINE SULFATE (PF) 2 MG/ML IV SOLN
2.0000 mg | INTRAVENOUS | Status: DC | PRN
  Administered 2015-07-05 (×5): 2 mg via INTRAVENOUS
  Filled 2015-07-04 (×5): qty 1

## 2015-07-04 MED ORDER — METHOCARBAMOL 500 MG PO TABS
500.0000 mg | ORAL_TABLET | Freq: Four times a day (QID) | ORAL | Status: DC | PRN
  Administered 2015-07-05 – 2015-07-06 (×4): 500 mg via ORAL
  Filled 2015-07-04 (×4): qty 1

## 2015-07-04 MED ORDER — OXYCODONE HCL 5 MG PO TABS
5.0000 mg | ORAL_TABLET | ORAL | Status: DC | PRN
  Administered 2015-07-05 – 2015-07-06 (×4): 10 mg via ORAL
  Filled 2015-07-04 (×4): qty 2

## 2015-07-04 MED ORDER — FENTANYL CITRATE (PF) 100 MCG/2ML IJ SOLN
INTRAMUSCULAR | Status: AC
  Filled 2015-07-04: qty 2

## 2015-07-04 MED ORDER — LACTATED RINGERS IV SOLN
INTRAVENOUS | Status: DC
Start: 2015-07-04 — End: 2015-07-04
  Administered 2015-07-04 (×2): via INTRAVENOUS

## 2015-07-04 MED ORDER — ONDANSETRON HCL 4 MG PO TABS: 4.0000 mg | ORAL_TABLET | Freq: Four times a day (QID) | ORAL | Status: DC | PRN

## 2015-07-04 MED ORDER — SENNA 8.6 MG PO TABS
1.0000 | ORAL_TABLET | Freq: Two times a day (BID) | ORAL | Status: DC
  Administered 2015-07-05: 8.6 mg via ORAL
  Filled 2015-07-04 (×3): qty 1

## 2015-07-04 MED ORDER — BUPROPION HCL ER (XL) 150 MG PO TB24
150.0000 mg | ORAL_TABLET | Freq: Every day | ORAL | Status: DC
  Administered 2015-07-05 – 2015-07-06 (×2): 150 mg via ORAL
  Filled 2015-07-04 (×2): qty 1

## 2015-07-04 MED ORDER — AMLODIPINE BESYLATE 10 MG PO TABS
10.0000 mg | ORAL_TABLET | Freq: Every day | ORAL | Status: DC
  Administered 2015-07-05 – 2015-07-06 (×2): 10 mg via ORAL
  Filled 2015-07-04 (×2): qty 1

## 2015-07-04 MED ORDER — PROPOFOL 10 MG/ML IV BOLUS
INTRAVENOUS | Status: DC | PRN
  Administered 2015-07-04: 200 mg via INTRAVENOUS

## 2015-07-04 MED ORDER — BUPIVACAINE HCL (PF) 0.25 % IJ SOLN
INTRAMUSCULAR | Status: AC
  Filled 2015-07-04: qty 30

## 2015-07-04 MED ORDER — FAMOTIDINE 20 MG PO TABS: 20.0000 mg | ORAL_TABLET | Freq: Two times a day (BID) | ORAL | Status: DC | PRN

## 2015-07-04 MED ORDER — METHOCARBAMOL 1000 MG/10ML IJ SOLN
500.0000 mg | Freq: Four times a day (QID) | INTRAVENOUS | Status: DC | PRN
  Filled 2015-07-04: qty 5

## 2015-07-04 MED ORDER — VANCOMYCIN HCL 10 G IV SOLR
1250.0000 mg | Freq: Once | INTRAVENOUS | Status: AC
  Administered 2015-07-05: 1250 mg via INTRAVENOUS
  Filled 2015-07-04: qty 1250

## 2015-07-04 MED ORDER — ONDANSETRON HCL 4 MG/2ML IJ SOLN: 4.0000 mg | Freq: Four times a day (QID) | INTRAMUSCULAR | Status: DC | PRN

## 2015-07-04 MED ORDER — LIDOCAINE HCL (CARDIAC) 20 MG/ML IV SOLN
INTRAVENOUS | Status: DC | PRN
  Administered 2015-07-04: 80 mg via INTRAVENOUS

## 2015-07-04 SURGICAL SUPPLY — 73 items
BANDAGE ACE 4X5 VEL STRL LF (GAUZE/BANDAGES/DRESSINGS) ×2 IMPLANT
BANDAGE ELASTIC 3 VELCRO ST LF (GAUZE/BANDAGES/DRESSINGS) ×4 IMPLANT
BANDAGE ELASTIC 4 VELCRO ST LF (GAUZE/BANDAGES/DRESSINGS) ×2 IMPLANT
BIT DRILL 2.2 SS TIBIAL (BIT) ×2 IMPLANT
BLADE SURG ROTATE 9660 (MISCELLANEOUS) IMPLANT
BNDG ESMARK 4X9 LF (GAUZE/BANDAGES/DRESSINGS) ×2 IMPLANT
BNDG GAUZE ELAST 4 BULKY (GAUZE/BANDAGES/DRESSINGS) ×6 IMPLANT
CORDS BIPOLAR (ELECTRODE) ×2 IMPLANT
COVER SURGICAL LIGHT HANDLE (MISCELLANEOUS) ×2 IMPLANT
CUFF TOURNIQUET SINGLE 18IN (TOURNIQUET CUFF) ×2 IMPLANT
CUFF TOURNIQUET SINGLE 24IN (TOURNIQUET CUFF) IMPLANT
DECANTER SPIKE VIAL GLASS SM (MISCELLANEOUS) IMPLANT
DRAIN TLS ROUND 10FR (DRAIN) IMPLANT
DRAPE OEC MINIVIEW 54X84 (DRAPES) IMPLANT
DRAPE SURG 17X23 STRL (DRAPES) ×2 IMPLANT
DRAPE U-SHAPE 47X51 STRL (DRAPES) ×2 IMPLANT
DRSG ADAPTIC 3X8 NADH LF (GAUZE/BANDAGES/DRESSINGS) ×2 IMPLANT
EVACUATOR 1/8 PVC DRAIN (DRAIN) IMPLANT
GAUZE SPONGE 4X4 12PLY STRL (GAUZE/BANDAGES/DRESSINGS) ×2 IMPLANT
GAUZE XEROFORM 1X8 LF (GAUZE/BANDAGES/DRESSINGS) ×2 IMPLANT
GAUZE XEROFORM 5X9 LF (GAUZE/BANDAGES/DRESSINGS) ×2 IMPLANT
GLOVE BIO SURGEON STRL SZ 6.5 (GLOVE) ×2 IMPLANT
GLOVE BIO SURGEON STRL SZ7 (GLOVE) ×2 IMPLANT
GLOVE BIOGEL M STRL SZ7.5 (GLOVE) ×2 IMPLANT
GLOVE BIOGEL PI IND STRL 7.0 (GLOVE) ×1 IMPLANT
GLOVE BIOGEL PI INDICATOR 7.0 (GLOVE) ×1
GLOVE ORTHO TXT STRL SZ7.5 (GLOVE) ×2 IMPLANT
GLOVE SS BIOGEL STRL SZ 8 (GLOVE) ×1 IMPLANT
GLOVE SUPERSENSE BIOGEL SZ 8 (GLOVE) ×1
GOWN STRL REUS W/ TWL LRG LVL3 (GOWN DISPOSABLE) ×3 IMPLANT
GOWN STRL REUS W/ TWL XL LVL3 (GOWN DISPOSABLE) ×3 IMPLANT
GOWN STRL REUS W/TWL LRG LVL3 (GOWN DISPOSABLE) ×3
GOWN STRL REUS W/TWL XL LVL3 (GOWN DISPOSABLE) ×3
KIT BASIN OR (CUSTOM PROCEDURE TRAY) ×2 IMPLANT
KIT ROOM TURNOVER OR (KITS) ×2 IMPLANT
MANIFOLD NEPTUNE II (INSTRUMENTS) ×2 IMPLANT
NEEDLE 22X1 1/2 (OR ONLY) (NEEDLE) IMPLANT
NEEDLE HYPO 25GX1X1/2 BEV (NEEDLE) IMPLANT
NS IRRIG 1000ML POUR BTL (IV SOLUTION) ×2 IMPLANT
PACK ORTHO EXTREMITY (CUSTOM PROCEDURE TRAY) ×2 IMPLANT
PAD ARMBOARD 7.5X6 YLW CONV (MISCELLANEOUS) ×4 IMPLANT
PAD CAST 4YDX4 CTTN HI CHSV (CAST SUPPLIES) ×2 IMPLANT
PADDING CAST COTTON 4X4 STRL (CAST SUPPLIES) ×2
PEG LOCKING SMOOTH 2.2X16 (Screw) ×2 IMPLANT
PEG LOCKING SMOOTH 2.2X18 (Peg) ×4 IMPLANT
PLATE STANDARD DVR LEFT (Plate) ×2 IMPLANT
PLATE STD DVR LT 24X51 (Plate) ×1 IMPLANT
SCREW LOCK 14X2.7X 3 LD TPR (Screw) ×2 IMPLANT
SCREW LOCK 16X2.7X 3 LD TPR (Screw) ×2 IMPLANT
SCREW LOCK 20X2.7X 3 LD TPR (Screw) ×2 IMPLANT
SCREW LOCKING 2.7X13MM (Screw) ×4 IMPLANT
SCREW LOCKING 2.7X14 (Screw) ×2 IMPLANT
SCREW LOCKING 2.7X16 (Screw) ×2 IMPLANT
SCREW LOCKING 2.7X20MM (Screw) ×2 IMPLANT
SOLUTION BETADINE 4OZ (MISCELLANEOUS) ×2 IMPLANT
SPLINT PLASTER EXTRA FAST 3X15 (CAST SUPPLIES) ×1
SPLINT PLASTER GYPS XFAST 3X15 (CAST SUPPLIES) ×1 IMPLANT
SPONGE GAUZE 4X4 12PLY STER LF (GAUZE/BANDAGES/DRESSINGS) ×2 IMPLANT
SPONGE LAP 4X18 X RAY DECT (DISPOSABLE) ×2 IMPLANT
SPONGE SCRUB IODOPHOR (GAUZE/BANDAGES/DRESSINGS) ×2 IMPLANT
SUT MNCRL AB 4-0 PS2 18 (SUTURE) ×2 IMPLANT
SUT PROLENE 4 0 PS 2 18 (SUTURE) ×6 IMPLANT
SUT VIC AB 2-0 CT1 27 (SUTURE)
SUT VIC AB 2-0 CT1 TAPERPNT 27 (SUTURE) IMPLANT
SUT VIC AB 3-0 FS2 27 (SUTURE) ×2 IMPLANT
SYR CONTROL 10ML LL (SYRINGE) IMPLANT
SYSTEM CHEST DRAIN TLS 7FR (DRAIN) IMPLANT
TOWEL OR 17X24 6PK STRL BLUE (TOWEL DISPOSABLE) ×2 IMPLANT
TOWEL OR 17X26 10 PK STRL BLUE (TOWEL DISPOSABLE) ×2 IMPLANT
TUBE CONNECTING 12X1/4 (SUCTIONS) ×2 IMPLANT
TUBE EVACUATION TLS (MISCELLANEOUS) ×2 IMPLANT
UNDERPAD 30X30 INCONTINENT (UNDERPADS AND DIAPERS) ×2 IMPLANT
WATER STERILE IRR 1000ML POUR (IV SOLUTION) ×2 IMPLANT

## 2015-07-04 NOTE — Progress Notes (Signed)
Patient reports that she struck her operative arm this morning on a dresser and that when she hit her arm it felt like it "rebroke" when she hit it. Patient reports additional pain, swelling, and bruising to arm. Noted hematoma to posterior arm. Patient does not have any point tenderness to elbow. Patient has swelling to fingers which she reported was new.

## 2015-07-04 NOTE — Anesthesia Preprocedure Evaluation (Signed)
Anesthesia Evaluation  Patient identified by MRN, date of birth, ID band Patient awake    Reviewed: Allergy & Precautions, H&P , NPO status , Patient's Chart, lab work & pertinent test results  History of Anesthesia Complications Negative for: history of anesthetic complications  Airway Mallampati: II  TM Distance: >3 FB Neck ROM: full    Dental no notable dental hx.    Pulmonary Current Smoker,    Pulmonary exam normal breath sounds clear to auscultation       Cardiovascular hypertension, On Medications Normal cardiovascular exam Rhythm:regular Rate:Normal     Neuro/Psych PSYCHIATRIC DISORDERS Depression  Neuromuscular disease    GI/Hepatic negative GI ROS, Neg liver ROS,   Endo/Other  negative endocrine ROS  Renal/GU negative Renal ROS     Musculoskeletal  (+) Arthritis ,   Abdominal   Peds  Hematology negative hematology ROS (+)   Anesthesia Other Findings   Reproductive/Obstetrics negative OB ROS                             Anesthesia Physical Anesthesia Plan  ASA: II  Anesthesia Plan: General and Regional   Post-op Pain Management:    Induction: Intravenous  Airway Management Planned: LMA  Additional Equipment:   Intra-op Plan:   Post-operative Plan: Extubation in OR  Informed Consent: I have reviewed the patients History and Physical, chart, labs and discussed the procedure including the risks, benefits and alternatives for the proposed anesthesia with the patient or authorized representative who has indicated his/her understanding and acceptance.   Dental Advisory Given  Plan Discussed with: Anesthesiologist, CRNA and Surgeon  Anesthesia Plan Comments: (Plan for LMA + Supraclavicular block)        Anesthesia Quick Evaluation

## 2015-07-04 NOTE — Anesthesia Postprocedure Evaluation (Signed)
Anesthesia Post Note  Patient: Tiffany Mueller  Procedure(s) Performed: Procedure(s) (LRB): OPEN REDUCTION INTERNAL FIXATION (ORIF) LEFT DISTAL RADIAL FRACTURE WITH ALLOGRAFT BONE GRAFTING (Left) LEFT CARPAL TUNNEL RELEASE (Left)  Patient location during evaluation: PACU Anesthesia Type: General and Regional Level of consciousness: awake, awake and alert and oriented Pain management: pain level controlled Vital Signs Assessment: post-procedure vital signs reviewed and stable Respiratory status: spontaneous breathing, nonlabored ventilation and respiratory function stable Cardiovascular status: blood pressure returned to baseline Anesthetic complications: no    Last Vitals:  Filed Vitals:   07/04/15 1944 07/04/15 2005  BP:  147/68  Pulse: 92 85  Temp: 36.6 C 36.6 C  Resp: 20 20    Last Pain:  Filed Vitals:   07/04/15 2048  PainSc: 0-No pain                 [name]

## 2015-07-04 NOTE — Progress Notes (Addendum)
Pharmacy Antibiotic Note  Tiffany Mueller is a 60 y.o. female admitted on 07/04/2015 with left CTS and radius fracture s/p repair.  Pharmacy has been consulted for post-op vancomycin dosing for 24 hrs. She received vancomycin 1250 mg IV pre-op at 17:20.   Plan: Vancomycin 1250 mg IV once post-op on 3/29 at 16:00 Pharmacy signing off, please re-consult if needed  Height: 5\' 2"  (157.5 cm) Weight: 143 lb 7 oz (65.063 kg) IBW/kg (Calculated) : 50.1  Temp (24hrs), Avg:98.3 F (36.8 C), Min:97.8 F (36.6 C), Max:98.8 F (37.1 C)   Recent Labs Lab 07/04/15 1507  WBC 7.0  CREATININE 1.04*    Estimated Creatinine Clearance: 50.3 mL/min (by C-G formula based on Cr of 1.04).    Allergies  Allergen Reactions  . Cephalosporins Itching  . Diphenhydramine Itching    Thank you for allowing pharmacy to be a part of this patient's care.  DuttonJennifer [place], 17[address] Rainbow BoulevardPharm.D., BCPS Clinical Pharmacist Pager: [phone] 07/04/2015 8:24 PM

## 2015-07-04 NOTE — Progress Notes (Signed)
Orthopedic Tech Progress Note Patient Details:  Tiffany RecordsLauren C Mueller 03/20/1956 [id number]  Ortho Devices Ortho Device/Splint Location: Rica Mastkuzman sling Ortho Device/Splint Interventions: Ordered, Application, Adjustment   Tiffany MoccasinHughes, Tiffany Mueller 07/04/2015, 8:48 PM

## 2015-07-04 NOTE — H&P (Signed)
Tiffany Mueller is an 60 y.o. female.   Chief Complaint: left CTS and radius fracture HPI: Patient presents for evaluation and treatment of the of their upper extremity predicament. The patient denies neck, back, chest or  abdominal pain. The patient notes that they have no lower extremity problems. The patients primary complaint is noted. We are planning surgical care pathway for the upper extremity.  Past Medical History  Diagnosis Date  . Osteoporosis   . Hypertension   . Depression   . ADD (attention deficit disorder)   . Anastomotic stricture of gastrojejunostomy 2002    EGD dilation  . Carpal tunnel syndrome   . Arthritis     Past Surgical History  Procedure Laterality Date  . Wrist surgery    . Gastric bypass  2001    [facility]  . Appendectomy    . Eye surgery      x4  . Abdominal hysterectomy  2007  . Laparoscopic oopherectomy    . Esophagogastroduodenoscopy  2002    Dr Oletta Lamas.  GJ stricture  . Cholecystectomy      Family History  Problem Relation Age of Onset  . Alzheimer's disease Mother   . Atrial fibrillation Father    Social History:  reports that she has been smoking Cigarettes.  She has a 20 pack-year smoking history. She does not have any smokeless tobacco history on file. She reports that she drinks about 4.2 oz of alcohol per week. She reports that she does not use illicit drugs.  Allergies:  Allergies  Allergen Reactions  . Cephalosporins Itching  . Diphenhydramine Itching    Medications Prior to Admission  Medication Sig Dispense Refill  . amLODipine (NORVASC) 5 MG tablet Take 10 mg by mouth daily.     Marland Kitchen amphetamine-dextroamphetamine (ADDERALL) 30 MG tablet Take 30 mg by mouth 2 (two) times daily.    Marland Kitchen buPROPion (WELLBUTRIN XL) 150 MG 24 hr tablet TAKE 1 TABLET IN THE MORNING ONCE A DAY ORALLY 30 DAY(S)  5  . furosemide (LASIX) 40 MG tablet Take 40 mg by mouth 2 (two) times daily.    Marland Kitchen venlafaxine XR (EFFEXOR-XR) 150 MG 24 hr capsule Take 150  mg by mouth daily with breakfast.    . HYDROcodone-acetaminophen (NORCO) 5-325 MG tablet Take 1-2 tablets by mouth every 6 (six) hours as needed. 20 tablet 0    Results for orders placed or performed during the hospital encounter of 07/04/15 (from the past 48 hour(s))  Comprehensive metabolic panel     Status: Abnormal   Collection Time: 07/04/15  3:07 PM  Result Value Ref Range   Sodium 141 135 - 145 mmol/L   Potassium 4.3 3.5 - 5.1 mmol/L   Chloride 109 101 - 111 mmol/L   CO2 23 22 - 32 mmol/L   Glucose, Bld 108 (H) 65 - 99 mg/dL   BUN 11 6 - 20 mg/dL   Creatinine, Ser 1.04 (H) 0.44 - 1.00 mg/dL   Calcium 8.9 8.9 - 10.3 mg/dL   Total Protein 6.6 6.5 - 8.1 g/dL   Albumin 4.0 3.5 - 5.0 g/dL   AST 21 15 - 41 U/L   ALT 15 14 - 54 U/L   Alkaline Phosphatase 93 38 - 126 U/L   Total Bilirubin 1.0 0.3 - 1.2 mg/dL   GFR calc non Af Amer 57 (L) >60 mL/min   GFR calc Af Amer >60 >60 mL/min    Comment: (NOTE) The eGFR has been calculated using the  CKD EPI equation. This calculation has not been validated in all clinical situations. eGFR's persistently <60 mL/min signify possible Chronic Kidney Disease.    Anion gap 9 5 - 15  CBC     Status: Abnormal   Collection Time: 07/04/15  3:07 PM  Result Value Ref Range   WBC 7.0 4.0 - 10.5 K/uL   RBC 4.15 3.87 - 5.11 MIL/uL   Hemoglobin 11.8 (L) 12.0 - 15.0 g/dL   HCT 37.6 36.0 - 46.0 %   MCV 90.6 78.0 - 100.0 fL   MCH 28.4 26.0 - 34.0 pg   MCHC 31.4 30.0 - 36.0 g/dL   RDW 14.2 11.5 - 15.5 %   Platelets 319 150 - 400 K/uL   Dg Forearm Left  07/04/2015  CLINICAL DATA:  Lateral elbow pain, recent radial fracture EXAM: LEFT FOREARM - 2 VIEW COMPARISON:  None. FINDINGS: Two views of the left forearm submitted. Again noted mild impacted fracture distal left radial metaphysis. Study is limited by casting material artifact. No new fracture or subluxation. No posterior fat pad sign. IMPRESSION: No new fracture or subluxation. Again noted mild  impacted fracture in distal left radial metaphysis. Electronically Signed   By: Lahoma Crocker M.D.   On: 07/04/2015 15:11   Dg Wrist Complete Left  07/04/2015  CLINICAL DATA:  Followup left wrist fracture. Re-injured hand today. EXAM: LEFT HAND - COMPLETE 3+ VIEW; LEFT WRIST - COMPLETE 3+ VIEW COMPARISON:  Radiographs 06/30/2015 FINDINGS: Left hand: There is a fiberglass cast creating moderate artifact. No acute hand fracture is identified. Left wrist: Impacted distal radius fracture is again demonstrated. Suspect some settling or further compression when compared to the prior lateral film. The carpal bones are intact. IMPRESSION: No acute left hand fracture. Interval progression of dorsal settling/ impaction of the distal radius fracture. Electronically Signed   By: Marijo Sanes M.D.   On: 07/04/2015 15:13   Dg Hand Complete Left  07/04/2015  CLINICAL DATA:  Followup left wrist fracture. Re-injured hand today. EXAM: LEFT HAND - COMPLETE 3+ VIEW; LEFT WRIST - COMPLETE 3+ VIEW COMPARISON:  Radiographs 06/30/2015 FINDINGS: Left hand: There is a fiberglass cast creating moderate artifact. No acute hand fracture is identified. Left wrist: Impacted distal radius fracture is again demonstrated. Suspect some settling or further compression when compared to the prior lateral film. The carpal bones are intact. IMPRESSION: No acute left hand fracture. Interval progression of dorsal settling/ impaction of the distal radius fracture. Electronically Signed   By: Marijo Sanes M.D.   On: 07/04/2015 15:13    ROS  Blood pressure 161/93, pulse 61, temperature 98.8 F (37.1 C), temperature source Oral, resp. rate 18, height _0  (1.575 m), weight 65.063 kg (143 lb 7 oz), SpO2 100 %. Physical Exam left radius fracture and left CTS-Phalens Tinels positive The patient is alert and oriented in no acute distress. The patient complains of pain in the affected upper extremity.  The patient is noted to have a normal HEENT  exam. Lung fields show equal chest expansion and no shortness of breath. Abdomen exam is nontender without distention. Lower extremity examination does not show any fracture dislocation or blood clot symptoms. Pelvis is stable and the neck and back are stable and nontender. Assessment/Plan We are planning surgery for your upper extremity. The risk and benefits of surgery to include risk of bleeding, infection, anesthesia,  damage to normal structures and failure of the surgery to accomplish its intended goals of relieving symptoms and restoring  function have been discussed in detail. With this in mind we plan to proceed. I have specifically discussed with the patient the pre-and postoperative regime and the dos and don'ts and risk and benefits in great detail. Risk and benefits of surgery also include risk of dystrophy(CRPS), chronic nerve pain, failure of the healing process to go onto completion and other inherent risks of surgery The relavent the pathophysiology of the disease/injury process, as well as the alternatives for treatment and postoperative course of action has been discussed in great detail with the patient who desires to proceed.  We will do everything in our power to help you (the patient) restore function to the upper extremity. It is a pleasure to see this patient today   left CTS and radius fracture-plan surgical reconstruction   Paulene Floor, MD 07/04/2015, 4:46 PM

## 2015-07-04 NOTE — Anesthesia Procedure Notes (Addendum)
Anesthesia Regional Block:  Supraclavicular block  Pre-Anesthetic Checklist: ,, timeout performed, Correct Patient, Correct Site, Correct Laterality, Correct Procedure, Correct Position, site marked, Risks and benefits discussed,  Surgical consent,  Pre-op evaluation,  At surgeon's request and post-op pain management  Laterality: Left  Prep: chloraprep       Needles:  Injection technique: Single-shot  Needle Type: Echogenic Stimulator Needle     Needle Length: 9cm 9 cm Needle Gauge: 21 and 21 G    Additional Needles:  Procedures: ultrasound guided (picture in chart) Supraclavicular block Narrative:  Injection made incrementally with aspirations every 5 mL.  Performed by: Personally  Anesthesiologist: JUDD, BENJAMIN  Additional Notes: Risks, benefits and alternative to block explained extensively.  Patient tolerated procedure well, without complications.   Procedure Name: LMA Insertion Date/Time: 07/04/2015 5:15 PM Performed by: Dairl PonderJIANG, [place] Pre-anesthesia Checklist: Patient identified, Emergency Drugs available, Suction available, Patient being monitored and Timeout performed Patient Re-evaluated:Patient Re-evaluated prior to inductionOxygen Delivery Method: Circle system utilized Preoxygenation: Pre-oxygenation with 100% oxygen Intubation Type: IV induction LMA: LMA inserted LMA Size: 4.0 Number of attempts: 1 Placement Confirmation: positive ETCO2 and breath sounds checked- equal and bilateral Tube secured with: Tape Dental Injury: Teeth and Oropharynx as per pre-operative assessment

## 2015-07-04 NOTE — Transfer of Care (Signed)
Immediate Anesthesia Transfer of Care Note  Patient: Tiffany Mueller  Procedure(s) Performed: Procedure(s): OPEN REDUCTION INTERNAL FIXATION (ORIF) LEFT DISTAL RADIAL FRACTURE WITH ALLOGRAFT BONE GRAFTING (Left) LEFT CARPAL TUNNEL RELEASE (Left)  Patient Location: PACU  Anesthesia Type:GA combined with regional for post-op pain  Level of Consciousness: awake, alert , oriented and patient cooperative  Airway & Oxygen Therapy: Patient Spontanous Breathing and Patient connected to nasal cannula oxygen  Post-op Assessment: Report given to RN, Post -op Vital signs reviewed and stable and Patient moving all extremities X 4  Post vital signs: Reviewed and stable  Last Vitals:  Filed Vitals:   07/04/15 1446 07/04/15 1840  BP: 161/93 157/73  Pulse: 61 99  Temp: 37.1 C 37 C  Resp: 18 18    Complications: No apparent anesthesia complications

## 2015-07-04 NOTE — Op Note (Signed)
See [id number]#[id number] Amanda PeaGramig MD

## 2015-07-05 ENCOUNTER — Encounter (HOSPITAL_COMMUNITY): Payer: Self-pay | Admitting: Orthopedic Surgery

## 2015-07-05 DIAGNOSIS — G5602 Carpal tunnel syndrome, left upper limb: Secondary | ICD-10-CM | POA: Diagnosis not present

## 2015-07-05 NOTE — Progress Notes (Signed)
Subjective: 1 Day Post-Op Procedure(s) (LRB): OPEN REDUCTION INTERNAL FIXATION (ORIF) LEFT DISTAL RADIAL FRACTURE WITH ALLOGRAFT BONE GRAFTING (Left) LEFT CARPAL TUNNEL RELEASE (Left) Patient reports pain as fairly controlled, she denies nausea,vomiting,fever or chills. She states she feels "whoozy and a little lightheaded.She describes residual effects of the block Objective: Vital signs in last 24 hours: Temp:  [97.7 F (36.5 C)-98.6 F (37 C)] 98.1 F (36.7 C) (03/29 1439) Pulse Rate:  [77-99] 78 (03/29 1439) Resp:  [16-26] 20 (03/29 1439) BP: (120-157)/(68-92) 132/80 mmHg (03/29 1439) SpO2:  [93 %-100 %] 98 % (03/29 1439)  Intake/Output from previous day: 03/28 0701 - 03/29 0700 In: 600 [I.V.:600] Out: 610 [Urine:600; Blood:10] Intake/Output this shift: Total I/O In: 480 [P.O.:480] Out: -    Recent Labs  07/04/15 1507  HGB 11.8*    Recent Labs  07/04/15 1507  WBC 7.0  RBC 4.15  HCT 37.6  PLT 319    Recent Labs  07/04/15 1507  NA 141  K 4.3  CL 109  CO2 23  BUN 11  CREATININE 1.04*  GLUCOSE 108*  CALCIUM 8.9   No results for input(s): LABPT, INR in the last 72 hours.  The patient is alert and oriented in no acute distress. The patient complains of pain in the affected upper extremity.  The patient is noted to have a normal HEENT exam. Lung fields show equal chest expansion and no shortness of breath. Abdomen exam is nontender without distention. Lower extremity examination does not show any fracture dislocation or blood clot symptoms. Pelvis is stable and the neck and back are stable and nontender. Upper extremity: splint c/d/i n/v intact, drain d/ced residual effects of the block present  Assessment/Plan: 1 Day Post-Op Procedure(s) (LRB): OPEN REDUCTION INTERNAL FIXATION (ORIF) LEFT DISTAL RADIAL FRACTURE WITH ALLOGRAFT BONE GRAFTING (Left) LEFT CARPAL TUNNEL RELEASE (Left) Continue observation, pain management, elevation and edema control.  Pending discharge tomorrow.   Olin Gurski L 07/05/2015, 5:18 PM

## 2015-07-05 NOTE — Evaluation (Signed)
Occupational Therapy Evaluation Patient Details Name: Tiffany Mueller MRN: [id number] DOB: [birth year] Today's Date: 07/05/2015    History of Present Illness This 60 y.o. female fell while gardening 06/29/25 and sustained Lt radius fracture.  She was admitted 07/04/15 for surgical repair and for CTR.  PMH includes:  osteoporosis, HTN, depression, ADD, anxiety   Clinical Impression   Patient evaluated by Occupational Therapy with no further acute OT needs identified. All education has been completed and the patient has no further questions. All education completed.  Pt does requires supervision for ADLs due to feeling "woozy and lightheaded" - RN aware, VSS.   Anticipate she will quickly progress to mod I once symptoms resolve.  See below for any follow-up Occupational Therapy or equipment needs. OT is signing off. Thank you for this referral.    Follow Up Recommendations  No OT follow up;Supervision - Intermittent    Equipment Recommendations  None recommended by OT    Recommendations for Other Services       Precautions / Restrictions Precautions Precautions: None Restrictions Weight Bearing Restrictions: Yes LUE Weight Bearing: Non weight bearing      Mobility Bed Mobility Overal bed mobility: Independent                Transfers Overall transfer level: Needs assistance   Transfers: Sit to/from Stand;Stand Pivot Transfers Sit to Stand: Supervision Stand pivot transfers: Supervision       General transfer comment: requires supervision due to feeling "woozy and lightheaded"    Balance Overall balance assessment: Needs assistance Sitting-balance support: Feet supported Sitting balance-Leahy Scale: Good     Standing balance support: During functional activity Standing balance-Leahy Scale: Good Standing balance comment: pt requires supervision due to feeling woozy and lightheaded                             ADL Overall ADL's : Needs  assistance/impaired                                       General ADL Comments: Pt is able to perform all ADLs with supervision due to pt c/o feeling "woozy and light headed"      Vision     Perception     Praxis      Pertinent Vitals/Pain Pain Assessment: 0-10 Pain Score: 4  Pain Location: Lt wrist  Pain Descriptors / Indicators: Aching;Grimacing Pain Intervention(s): Monitored during session;Ice applied;Repositioned     Hand Dominance Right   Extremity/Trunk Assessment Upper Extremity Assessment Upper Extremity Assessment: LUE deficits/detail LUE Deficits / Details: shoulder and elbow ROM WFL.  Finger AROM limited by cast  LUE Coordination: decreased fine motor   Lower Extremity Assessment Lower Extremity Assessment: Defer to PT evaluation   Cervical / Trunk Assessment Cervical / Trunk Assessment: Normal   Communication Communication Communication: No difficulties   Cognition Arousal/Alertness: Awake/alert Behavior During Therapy: WFL for tasks assessed/performed Overall Cognitive Status: Within Functional Limits for tasks assessed                     General Comments       Exercises       Shoulder Instructions      Home Living Family/patient expects to be discharged to:: Private residence Living Arrangements: Spouse/significant other;Children Available Help at Discharge: Family;Available PRN/intermittently Type of Home: House  Bathroom Toilet: Standard                Prior Functioning/Environment Level of Independence: Independent             OT Diagnosis: Generalized weakness;Acute pain   OT Problem List: Pain;Impaired UE functional use   OT Treatment/Interventions:      OT Goals(Current goals can be found in the care plan section) Acute Rehab OT Goals OT Goal Formulation: All assessment and education complete, DC therapy  OT Frequency:     Barriers to D/C:             Co-evaluation              End of Session Nurse Communication: Mobility status  Activity Tolerance: Patient tolerated treatment well Patient left: in bed;with call bell/phone within reach   Time: 1057-1130 OT Time Calculation (min): 33 min Charges:  OT General Charges $OT Visit: 1 Procedure OT Evaluation $OT Eval Low Complexity: 1 Procedure OT Treatments $Self Care/Home Management : 8-22 mins G-Codes: OT G-codes **NOT FOR INPATIENT CLASS** Functional Limitation: Self care Self Care Current Status (A2130(G8987): At least 1 percent but less than 20 percent impaired, limited or restricted Self Care Goal Status (Q6578(G8988): At least 1 percent but less than 20 percent impaired, limited or restricted Self Care Discharge Status [phone](G8989): At least 1 percent but less than 20 percent impaired, limited or restricted  Tiffany Mueller 07/05/2015, 1:27 PM

## 2015-07-05 NOTE — Op Note (Signed)
NAMShelton Silvas:  Bednarczyk, Lafaye               ACCOUNT NO.:  [id number]  MEDICAL RECORD NO.:  [id number]  LOCATION:  [id number]                        FACILITY:  [facility]  PHYSICIAN:  Dionne AnoWilliam M. Haifa Hatton, M.D.DATE OF BIRTH:  January 21, 1956  DATE OF PROCEDURE: DATE OF DISCHARGE:                              OPERATIVE REPORT   PREOPERATIVE DIAGNOSES: 1. Carpal tunnel syndrome, left upper extremity. 2. Left wrist fracture acute in nature with displacement and     progressive angulatory collapse.  POSTOPERATIVE DIAGNOSES: 1. Carpal tunnel syndrome, left upper extremity. 2. Left wrist fracture acute in nature with displacement and     progressive angulatory collapse.  PROCEDURE: 1. Open left carpal tunnel release. 2. Left distal radius open reduction internal fixation greater than 3-     part articular distal radius fracture utilizing the [employer] plate     and screw construct (Crosslock). 3. AP, lateral, and oblique x-rays performed, examined, interpreted by     myself.  SURGEON:  Dionne AnoWilliam M. Amanda PeaGramig, MD  ASSISTANT:  None.  COMPLICATIONS:  None.  ANESTHESIA:  General with preoperative block.  TOURNIQUET TIME:  Less than an hour.  DRAINS:  One.  INDICATIONS:  Pleasant female who presents by admission diagnosis.  She has had a prior distal radius injury and already had some degree of deformity.  She had a recent injury with fracture and progressive angulatory collapse.  She does have pre-existing carpal tunnel syndrome and thus we recommended release of the carpal tunnel as she is symptomatic.  She understands risks and benefits, do's and don'ts.  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  OPERATION IN DETAIL:  The patient was seen by myself and Anesthesia, taken to the operative theater and underwent smooth induction of general anesthesia.   She was prepped and draped with Betadine scrub and paint. Prior to this, a Hibiclens pre-scrub was accomplished.  Antibiotics were given.  She was sterilely draped.  Time-out observed.  At this time, insufflation of the tourniquet occurred at 250 mmHg.  Volar radial incision was made.  We were very careful with the skin markings that she had preexisting.  Following this, I dissected down, incised the FCR tendon sheath palmarly and dorsally, and then retracted carpal canal contents ulnarly.  Following this, pronator was incised.  I accessed the fracture, fracture was reduced, held, and following this, a standard Crosslock DVR plate and screw construct from [employer] was applied.  She tolerated this well.  There were no complicating features.  Once this was performed, we then very carefully and cautiously pursued irrigation followed by AP, lateral, and oblique x-rays which showed adequate radial height, inclination, and volar tilt.  We then closed the pronator with a Vicryl.  Following this, I made an incision about the carpal tunnel region a 1.5 to 2 cm incision was made.  Dissection was carried down. Palmar fascia was incised.  Distal transverse carpal ligament was identified and released under 4.0 loupe magnification.  Fat pad egressed nicely and the superficial palmar arch was identified and protected. Following this,  distal to proximal dissection was carried out intact, room was available to slide the blunt-tip iridectomy scissors to remain in leaflet of the proximal ledge of the transverse carpal ligament and in the portions of the antebrachial fascia.  This was all done under direct 4.0 loupe magnification.  There were no complicating features.  Following this, we irrigated copiously, deflated the tourniquet, secured hemostasis, placed a TLS drain size 7 in the volar radial wound and closed all wounds with the Prolene at the skin edge.  Short gauntlet brace/splint was applied after  sterile dressing had been placed.  The patient tolerated this well.  She will be monitored in the recovery room.  We will admit her overnight for IV antibiotics, general postop observation of the measures.  I have discussed all the issues with she and her family.  These notes discussed.  All questions answered and addressed.     Dionne Ano. Amanda Pea, M.D.     [facility]  D:  07/04/2015  T:  07/05/2015  Job:  [id number]

## 2015-07-05 NOTE — Evaluation (Signed)
Physical Therapy Evaluation Patient Details Name: Tiffany Mueller MRN: [id number] DOB: Sep 02, 1955 Today's Date: 07/05/2015   History of Present Illness  This 60 y.o. female fell while gardening 06/29/25 and sustained Lt radius fracture.  She was admitted 07/04/15 for surgical repair and for CTR.  PMH includes:  osteoporosis, HTN, depression, ADD, anxiety  Clinical Impression  Pt is up and moving well but mild feelings from meds.  She is able to be minimally supervised by nursing and will be able to get home.  No expectation of PT needs as pt is able to transition from bed to BR to room and walking with no LOB.  Will expect discharge with no further PT needs.  PT signing off and thank you for the referral.    Follow Up Recommendations No PT follow up    Equipment Recommendations  None recommended by PT    Recommendations for Other Services       Precautions / Restrictions Precautions Precautions: None Restrictions Weight Bearing Restrictions: Yes LUE Weight Bearing: Non weight bearing      Mobility  Bed Mobility Overal bed mobility: Independent                Transfers Overall transfer level: Needs assistance   Transfers: Sit to/from Stand;Stand Pivot Transfers Sit to Stand: Supervision Stand pivot transfers: Supervision       General transfer comment: requires supervision due to feeling "woozy and lightheaded"  Ambulation/Gait Ambulation/Gait assistance: Supervision Ambulation Distance (Feet): 50 Feet Assistive device: None Gait Pattern/deviations: Step-through pattern;Narrow base of support Gait velocity: reduced Gait velocity interpretation: Below normal speed for age/gender General Gait Details: Pt reaches well of BOS to closet with no LOB  Stairs            Wheelchair Mobility    Modified Rankin (Stroke Patients Only)       Balance Overall balance assessment: Needs assistance Sitting-balance support: Feet supported Sitting balance-Leahy  Scale: Good     Standing balance support: During functional activity Standing balance-Leahy Scale: Good Standing balance comment: pt requires supervision due to feeling woozy and lightheaded                              Pertinent Vitals/Pain Pain Assessment: 0-10 Pain Score: 4  Pain Location: L wrist Pain Descriptors / Indicators: Aching;Operative site guarding Pain Intervention(s): Monitored during session;Premedicated before session    Home Living Family/patient expects to be discharged to:: Private residence Living Arrangements: Spouse/significant other;Children Available Help at Discharge: Family;Available PRN/intermittently Type of Home: House         Home Equipment: None      Prior Function Level of Independence: Independent               Hand Dominance   Dominant Hand: Right    Extremity/Trunk Assessment   Upper Extremity Assessment: Defer to OT evaluation       LUE Deficits / Details: shoulder and elbow ROM WFL.  Finger AROM limited by cast    Lower Extremity Assessment: Overall WFL for tasks assessed      Cervical / Trunk Assessment: Normal  Communication   Communication: No difficulties  Cognition Arousal/Alertness: Awake/alert Behavior During Therapy: WFL for tasks assessed/performed Overall Cognitive Status: Within Functional Limits for tasks assessed                      General Comments General comments (skin integrity, edema, etc.): PT  observed pt with BR transition, in and out of sitting and out of bed without assisting her.  Pt is reminded about elevation of LUE and NWB which she voices understanding    Exercises        Assessment/Plan    PT Assessment Patent does not need any further PT services  PT Diagnosis Acute pain   PT Problem List    PT Treatment Interventions     PT Goals (Current goals can be found in the Care Plan section) Acute Rehab PT Goals Patient Stated Goal: to get dressed  PT Goal  Formulation: All assessment and education complete, DC therapy    Frequency     Barriers to discharge        Co-evaluation               End of Session   Activity Tolerance: Patient tolerated treatment well Patient left: in chair;with call bell/phone within reach Nurse Communication: Mobility status    Functional Assessment Tool Used: clinical judgment Functional Limitation: Mobility: Walking and moving around Mobility: Walking and Moving Around Current Status (R6045(G8978): At least 1 percent but less than 20 percent impaired, limited or restricted Mobility: Walking and Moving Around Goal Status [phone](G8979): At least 1 percent but less than 20 percent impaired, limited or restricted Mobility: Walking and Moving Around Discharge Status [phone](G8980): At least 1 percent but less than 20 percent impaired, limited or restricted    Time: 1130-1155 PT Time Calculation (min) (ACUTE ONLY): 25 min   Charges:   PT Evaluation $PT Eval Low Complexity: 1 Procedure     PT G Codes:   PT G-Codes **NOT FOR INPATIENT CLASS** Functional Assessment Tool Used: clinical judgment Functional Limitation: Mobility: Walking and moving around Mobility: Walking and Moving Around Current Status (W2956(G8978): At least 1 percent but less than 20 percent impaired, limited or restricted Mobility: Walking and Moving Around Goal Status [phone](G8979): At least 1 percent but less than 20 percent impaired, limited or restricted Mobility: Walking and Moving Around Discharge Status [phone](G8980): At least 1 percent but less than 20 percent impaired, limited or restricted    Ivar DrapeStout, Samanthia Howland E 07/05/2015, 2:05 PM   Samul Dadauth Rahm Minix, PT MS Acute Rehab Dept. Number: [facility] [phone] and [facility] [phone]

## 2015-07-05 NOTE — Progress Notes (Signed)
Pt unable to give simple ,direct answer when asked how she is feeling. Gives vague and generalized responses. Refused adderral this pm

## 2015-07-06 DIAGNOSIS — G5602 Carpal tunnel syndrome, left upper limb: Secondary | ICD-10-CM | POA: Diagnosis not present

## 2015-07-06 MED ORDER — OXYCODONE HCL 5 MG PO TABS: 5.0000 mg | ORAL_TABLET | ORAL | Status: DC | PRN

## 2015-07-06 NOTE — Discharge Instructions (Signed)

## 2015-07-06 NOTE — Progress Notes (Signed)
Tiffany Mueller to be D/Mueller'd Home per MD order.  Discussed with the patient and all questions fully answered.  VSS, Sling on left upper extremity. IV catheter discontinued intact. Site without signs and symptoms of complications. Dressing and pressure applied.  An After Visit Summary was printed and given to the patient. Patient received prescription.  D/Mueller education completed with patient/family including follow up instructions, medication list, d/Mueller activities limitations if indicated, with other d/Mueller instructions as indicated by MD - patient able to verbalize understanding, all questions fully answered.   Patient instructed to return to ED, call 911, or call MD for any changes in condition.   Patient to be escorted via WC, and D/Mueller home via private auto.  L'ESPER[place], Tiffany Mueller 07/06/2015 10:45 AM

## 2015-07-06 NOTE — Discharge Summary (Signed)
  Patient has been seen and examined. Patient has pain appropriate to his injury/process. Patient denies new complaints at this present time. I have discussed the care pathway with nursing staff. Patient is appropriate and alert.  We reviewed vital signs and intake output which are stable.  The upper extremity is neurovascularly intact. Refill is normal. There is no signs of compartment syndrome. There is no signs of dystrophy. There is normal sensation.  I have spent a  great deal of time discussing range of motion edema control and other techniques to decrease edema and promote flexion extension of the fingers. Patient understands the importance of elevation range of motion massage and other measures to lessen pain and prevent swelling.  We have also discussed immobilization to appropriate areas involved.  We have discussed with the patient shoulder range of motion to prevent adhesive capsulitis.  The remainder of the examination is normal today without complicating feature.  Drain was removed without difficulty  Patient will be discharged home. Will plan to see the patient back in the office as per discharge instructions (please see discharge instructions).  Patient had an uneventful hospital course. At the time of discharge patient is stable awake alert and oriented in no acute distress. Regular diet will be continued and has been tolerated. Patient will notify should have problems occur. There is no signs of DVT infection or other complication at this juncture.  All questions have been incurred and answered.  Please see discharge med list  SP ORIF DRF with CTR  Jovonni Borquez MD

## 2016-05-24 DIAGNOSIS — I10 Essential (primary) hypertension: Secondary | ICD-10-CM | POA: Diagnosis not present

## 2016-06-18 DIAGNOSIS — Z0100 Encounter for examination of eyes and vision without abnormal findings: Secondary | ICD-10-CM | POA: Diagnosis not present

## 2016-07-03 DIAGNOSIS — M25532 Pain in left wrist: Secondary | ICD-10-CM | POA: Diagnosis not present

## 2016-07-03 DIAGNOSIS — G5601 Carpal tunnel syndrome, right upper limb: Secondary | ICD-10-CM | POA: Diagnosis not present

## 2016-08-06 DIAGNOSIS — J309 Allergic rhinitis, unspecified: Secondary | ICD-10-CM | POA: Diagnosis not present

## 2016-12-10 DIAGNOSIS — I10 Essential (primary) hypertension: Secondary | ICD-10-CM | POA: Diagnosis not present

## 2016-12-10 DIAGNOSIS — M810 Age-related osteoporosis without current pathological fracture: Secondary | ICD-10-CM | POA: Diagnosis not present

## 2017-01-13 DIAGNOSIS — M810 Age-related osteoporosis without current pathological fracture: Secondary | ICD-10-CM | POA: Diagnosis not present

## 2017-05-07 DIAGNOSIS — I10 Essential (primary) hypertension: Secondary | ICD-10-CM | POA: Diagnosis not present

## 2017-06-12 DIAGNOSIS — E538 Deficiency of other specified B group vitamins: Secondary | ICD-10-CM | POA: Diagnosis not present

## 2017-06-12 DIAGNOSIS — I10 Essential (primary) hypertension: Secondary | ICD-10-CM | POA: Diagnosis not present

## 2017-06-12 DIAGNOSIS — D649 Anemia, unspecified: Secondary | ICD-10-CM | POA: Diagnosis not present

## 2017-06-16 ENCOUNTER — Other Ambulatory Visit: Payer: Self-pay | Admitting: Internal Medicine

## 2017-06-16 DIAGNOSIS — M5136 Other intervertebral disc degeneration, lumbar region: Secondary | ICD-10-CM | POA: Diagnosis not present

## 2017-06-16 DIAGNOSIS — M4316 Spondylolisthesis, lumbar region: Secondary | ICD-10-CM | POA: Diagnosis not present

## 2017-06-16 DIAGNOSIS — M545 Low back pain, unspecified: Secondary | ICD-10-CM | POA: Insufficient documentation

## 2017-06-16 DIAGNOSIS — M810 Age-related osteoporosis without current pathological fracture: Secondary | ICD-10-CM

## 2017-06-16 DIAGNOSIS — Z139 Encounter for screening, unspecified: Secondary | ICD-10-CM

## 2017-06-26 DIAGNOSIS — M5136 Other intervertebral disc degeneration, lumbar region: Secondary | ICD-10-CM | POA: Diagnosis not present

## 2017-07-04 DIAGNOSIS — M810 Age-related osteoporosis without current pathological fracture: Secondary | ICD-10-CM | POA: Diagnosis not present

## 2017-07-04 DIAGNOSIS — M5136 Other intervertebral disc degeneration, lumbar region: Secondary | ICD-10-CM | POA: Diagnosis not present

## 2017-07-04 DIAGNOSIS — M48061 Spinal stenosis, lumbar region without neurogenic claudication: Secondary | ICD-10-CM | POA: Diagnosis not present

## 2017-07-11 DIAGNOSIS — M5416 Radiculopathy, lumbar region: Secondary | ICD-10-CM | POA: Diagnosis not present

## 2017-07-14 DIAGNOSIS — M5416 Radiculopathy, lumbar region: Secondary | ICD-10-CM | POA: Diagnosis not present

## 2017-07-15 ENCOUNTER — Ambulatory Visit
Admission: RE | Admit: 2017-07-15 | Discharge: 2017-07-15 | Disposition: A | Discharge status: Home / Self Care | Payer: 59 | Source: Ambulatory Visit | Attending: Internal Medicine | Admitting: Internal Medicine

## 2017-07-15 DIAGNOSIS — M810 Age-related osteoporosis without current pathological fracture: Secondary | ICD-10-CM | POA: Diagnosis not present

## 2017-07-15 DIAGNOSIS — Z780 Asymptomatic menopausal state: Secondary | ICD-10-CM | POA: Diagnosis not present

## 2017-07-15 DIAGNOSIS — Z1231 Encounter for screening mammogram for malignant neoplasm of breast: Secondary | ICD-10-CM | POA: Diagnosis not present

## 2017-07-15 DIAGNOSIS — Z139 Encounter for screening, unspecified: Secondary | ICD-10-CM

## 2017-08-27 DIAGNOSIS — Z0100 Encounter for examination of eyes and vision without abnormal findings: Secondary | ICD-10-CM | POA: Diagnosis not present

## 2017-10-10 DIAGNOSIS — I10 Essential (primary) hypertension: Secondary | ICD-10-CM | POA: Diagnosis not present

## 2017-10-10 DIAGNOSIS — Z136 Encounter for screening for cardiovascular disorders: Secondary | ICD-10-CM | POA: Diagnosis not present

## 2017-10-13 DIAGNOSIS — M5416 Radiculopathy, lumbar region: Secondary | ICD-10-CM | POA: Diagnosis not present

## 2017-10-13 DIAGNOSIS — M545 Low back pain: Secondary | ICD-10-CM | POA: Diagnosis not present

## 2017-10-13 DIAGNOSIS — M48061 Spinal stenosis, lumbar region without neurogenic claudication: Secondary | ICD-10-CM | POA: Insufficient documentation

## 2017-10-13 DIAGNOSIS — M810 Age-related osteoporosis without current pathological fracture: Secondary | ICD-10-CM | POA: Diagnosis not present

## 2017-12-30 DIAGNOSIS — J209 Acute bronchitis, unspecified: Secondary | ICD-10-CM | POA: Diagnosis not present

## 2018-01-13 ENCOUNTER — Ambulatory Visit
Admission: RE | Admit: 2018-01-13 | Discharge: 2018-01-13 | Disposition: A | Discharge status: Home / Self Care | Payer: Self-pay | Source: Ambulatory Visit | Attending: Physician Assistant | Admitting: Physician Assistant

## 2018-01-13 ENCOUNTER — Other Ambulatory Visit: Payer: Self-pay | Admitting: Physician Assistant

## 2018-01-13 DIAGNOSIS — R0602 Shortness of breath: Secondary | ICD-10-CM | POA: Diagnosis not present

## 2018-01-13 DIAGNOSIS — R05 Cough: Secondary | ICD-10-CM

## 2018-01-13 DIAGNOSIS — R059 Cough, unspecified: Secondary | ICD-10-CM

## 2018-05-19 DIAGNOSIS — I10 Essential (primary) hypertension: Secondary | ICD-10-CM | POA: Diagnosis not present

## 2018-05-19 DIAGNOSIS — G4700 Insomnia, unspecified: Secondary | ICD-10-CM | POA: Diagnosis not present

## 2018-05-19 DIAGNOSIS — R809 Proteinuria, unspecified: Secondary | ICD-10-CM | POA: Diagnosis not present

## 2018-05-23 ENCOUNTER — Emergency Department (HOSPITAL_COMMUNITY)
Admission: EM | Admit: 2018-05-23 | Discharge: 2018-05-23 | Disposition: A | Discharge status: Home / Self Care | Payer: 59 | Attending: Emergency Medicine | Admitting: Emergency Medicine

## 2018-05-23 ENCOUNTER — Emergency Department (HOSPITAL_COMMUNITY): Payer: 59

## 2018-05-23 ENCOUNTER — Encounter (HOSPITAL_COMMUNITY): Payer: Self-pay

## 2018-05-23 DIAGNOSIS — M25512 Pain in left shoulder: Secondary | ICD-10-CM | POA: Diagnosis not present

## 2018-05-23 DIAGNOSIS — I10 Essential (primary) hypertension: Secondary | ICD-10-CM | POA: Insufficient documentation

## 2018-05-23 DIAGNOSIS — F17210 Nicotine dependence, cigarettes, uncomplicated: Secondary | ICD-10-CM | POA: Insufficient documentation

## 2018-05-23 DIAGNOSIS — Z79899 Other long term (current) drug therapy: Secondary | ICD-10-CM | POA: Insufficient documentation

## 2018-05-23 DIAGNOSIS — S4992XA Unspecified injury of left shoulder and upper arm, initial encounter: Secondary | ICD-10-CM | POA: Diagnosis not present

## 2018-05-23 NOTE — ED Triage Notes (Signed)
Pt tripped on her coffee table last night landing on her left shoulder, now has shoulder pain. No obvious deformity. CMS intact. Not on thinners, did not hit head

## 2018-05-23 NOTE — Discharge Instructions (Addendum)
You were seen today for pain in your shoulder. Please read and follow all provided instructions.    1. Medications: alternate ibuprofen and tylenol for pain control, usual home medications  2. Treatment: rest, ice, elevate and use brace, drink plenty of fluids, gentle stretching  3. Follow Up: Please followup with orthopedics as directed or your PCP in 1 week if no improvement for discussion of your diagnoses and further evaluation after today's visit; if you do not have a primary care doctor use the resource guide provided to find one; Please return to the ER for worsening symptoms or other concerns

## 2018-05-23 NOTE — ED Notes (Signed)
Patient transported to X-ray 

## 2018-05-23 NOTE — ED Provider Notes (Signed)
[facility] EMERGENCY DEPARTMENT Provider Note   CSN: [id number] Arrival date & time: 05/23/18  0946     History   Chief Complaint Chief Complaint  Patient presents with  . Shoulder Pain    HPI Tiffany Mueller is a 63 y.o. female with history of hypertension presenting to emergency department with chief complaint of left shoulder pain x 1 day.  Patient reports mechanical fall, describing it as she slipped on a blanket that was on the ground causing her to lose her footing.  She landed on her left shoulder on the carpeted floor.  Denies hitting her head LOC.  Patient is not on blood thinners.  Patient reports the pain is in her left shoulder and radiates to her neck.  She describes the pain as constant and sharp.  She did not take anything for pain prior to arrival. She rates the pain 6 out of 10 severity.  Denies previous injury to her left shoulder.  Past Medical History:  Diagnosis Date  . ADD (attention deficit disorder)   . Anastomotic stricture of gastrojejunostomy 2002   EGD dilation  . Anxiety   . Arthritis   . Carpal tunnel syndrome   . Depression   . Hypertension   . Osteoporosis     Patient Active Problem List   Diagnosis Date Noted  . Distal radius fracture, left 07/04/2015  . Ileitis 11/08/2014  . History of gastric bypass 2001 11/08/2014  . Abdominal pain 11/08/2014  . Never has had a colonoscopy 11/08/2014  . Hypertension   . Essential hypertension     Past Surgical History:  Procedure Laterality Date  . ABDOMINAL HYSTERECTOMY  2007  . APPENDECTOMY    . CARPAL TUNNEL RELEASE Left 07/04/2015  . CARPAL TUNNEL RELEASE Left 07/04/2015   Procedure: LEFT CARPAL TUNNEL RELEASE;  Surgeon: Dominica Severin, MD;  Location: [facility] OR;  Service: Orthopedics;  Laterality: Left;  . ESOPHAGOGASTRODUODENOSCOPY (EGD) WITH ESOPHAGEAL DILATION  2002   Dr Randa Evens.  GJ stricture  . FRACTURE SURGERY    . GASTRIC BYPASS  2001   [facility]  . LAPAROSCOPIC  CHOLECYSTECTOMY    . LAPAROSCOPIC OOPHERECTOMY    . LIGAMENT REPAIR Right    "wrist"  . OPEN REDUCTION INTERNAL FIXATION (ORIF) DISTAL RADIAL FRACTURE Left 07/04/2015   WITH ALLOGRAFT BONE GRAFTING   . OPEN REDUCTION INTERNAL FIXATION (ORIF) DISTAL RADIAL FRACTURE Left 07/04/2015   Procedure: OPEN REDUCTION INTERNAL FIXATION (ORIF) LEFT DISTAL RADIAL FRACTURE WITH ALLOGRAFT BONE GRAFTING;  Surgeon: Dominica Severin, MD;  Location: [facility] OR;  Service: Orthopedics;  Laterality: Left;  . STRABISMUS SURGERY Bilateral    "4 on the left; 1 on right"  . TUBAL LIGATION       OB History   No obstetric history on file.      Home Medications    Prior to Admission medications   Medication Sig Start Date End Date Taking? Authorizing Provider  amLODipine (NORVASC) 5 MG tablet Take 10 mg by mouth daily.     [provider]  amphetamine-dextroamphetamine (ADDERALL) 30 MG tablet Take 30 mg by mouth 2 (two) times daily.    [provider]  buPROPion (WELLBUTRIN XL) 150 MG 24 hr tablet TAKE 1 TABLET IN THE MORNING ONCE A DAY ORALLY 30 DAY(S) 10/26/14   [provider]  furosemide (LASIX) 40 MG tablet Take 40 mg by mouth 2 (two) times daily.    [provider]  HYDROcodone-acetaminophen (NORCO) 5-325 MG tablet Take 1-2  tablets by mouth every 6 (six) hours as needed. 06/30/15   Geoffery Lyonselo, Douglas, MD  oxyCODONE (OXY IR/ROXICODONE) 5 MG immediate release tablet Take 1-2 tablets (5-10 mg total) by mouth every 3 (three) hours as needed for moderate pain. 07/06/15   Dominica SeverinGramig, William, MD  venlafaxine XR (EFFEXOR-XR) 150 MG 24 hr capsule Take 150 mg by mouth daily with breakfast.    [provider]    Family History Family History  Problem Relation Age of Onset  . Alzheimer's disease Mother   . Breast cancer Mother   . Atrial fibrillation Father   . Breast cancer Maternal Grandmother     Social History Social History   Tobacco Use  . Smoking status: Current Every Day  Smoker    Packs/day: 0.50    Years: 45.00    Pack years: 22.50    Types: Cigarettes  . Smokeless tobacco: Never Used  Substance Use Topics  . Alcohol use: Yes    Alcohol/week: 28.0 standard drinks    Types: 7 Standard drinks or equivalent, 21 Glasses of wine per week    Comment: 2-3 glass wine nightly  . Drug use: No     Allergies   Cephalosporins and Diphenhydramine   Review of Systems Review of Systems  Constitutional: Negative for chills and fever.  HENT: Negative for congestion, sinus pressure and sore throat.   Eyes: Negative for pain and visual disturbance.  Respiratory: Negative for chest tightness and shortness of breath.   Cardiovascular: Negative for chest pain and palpitations.  Gastrointestinal: Negative for abdominal pain, diarrhea, nausea and vomiting.  Genitourinary: Negative for difficulty urinating and hematuria.  Musculoskeletal: Positive for arthralgias and neck pain. Negative for back pain, gait problem, joint swelling and neck stiffness.  Skin: Negative for rash and wound.  Neurological: Negative for syncope and headaches.     Physical Exam Updated Vital Signs BP (!) 163/85 (BP Location: Right Arm)   Pulse 70   Temp 98.5 F (36.9 C) (Oral)   Resp 18   SpO2 98%   Physical Exam Vitals signs and nursing note reviewed.  Constitutional:      Appearance: She is not ill-appearing or toxic-appearing.  HENT:     Head: Normocephalic and atraumatic.     Nose: Nose normal.     Mouth/Throat:     Mouth: Mucous membranes are moist.     Pharynx: Oropharynx is clear.  Eyes:     General: No scleral icterus.    Extraocular Movements: Extraocular movements intact.     Conjunctiva/sclera: Conjunctivae normal.     Pupils: Pupils are equal, round, and reactive to light.  Neck:     Musculoskeletal: Normal range of motion. No muscular tenderness.  Cardiovascular:     Rate and Rhythm: Normal rate and regular rhythm.     Pulses: Normal pulses.     Heart  sounds: Normal heart sounds.  Pulmonary:     Effort: Pulmonary effort is normal.     Breath sounds: Normal breath sounds.  Abdominal:     General: There is no distension.     Palpations: Abdomen is soft.     Tenderness: There is no abdominal tenderness.  Musculoskeletal:     Comments: Left shoulder with tenderness to palpation . Full ROM. Negative empty can test, negative Neer's. No swelling, erythema or ecchymosis present. No step-off, crepitus, or deformity appreciated. 5/5 muscle strength of bilateral UE. 2+ radial pulse, sensation intact and all compartments soft. Full ROM and non tender to palpation  in left elbow and left wrist.  Skin:    General: Skin is warm and dry.     Capillary Refill: Capillary refill takes less than 2 seconds.  Neurological:     Mental Status: She is alert. Mental status is at baseline.     Motor: No weakness.  Psychiatric:        Behavior: Behavior normal.      ED Treatments / Results  Labs (all labs ordered are listed, but only abnormal results are displayed) Labs Reviewed - No data to display  EKG None  Radiology Dg Shoulder Left  Result Date: 05/23/2018 CLINICAL DATA:  Post fall last evening, now with left shoulder pain. EXAM: LEFT SHOULDER - 2+ VIEW COMPARISON:  None. FINDINGS: No fracture or dislocation. Glenohumeral and acromioclavicular joint spaces appear preserved. No evidence of calcific tendinitis. Limited visualization adjacent thorax is normal. Regional soft tissues appear normal. IMPRESSION: No explanation for patient's left shoulder pain. Electronically Signed   By: Simonne Come M.D.   On: 05/23/2018 10:27    Procedures Procedures (including critical care time)  Medications Ordered in ED Medications - No data to display   Initial Impression / Assessment and Plan / ED Course  I have reviewed the triage vital signs and the nursing notes.  Pertinent labs & imaging results that were available during my care of the patient were  reviewed by me and considered in my medical decision making (see chart for details).     Patient presents to the ED with complaints of pain to the  Left shoulder  s/p mechanical fall. Exam without obvious deformity or open wounds. ROM intact. Tender to palpation. NVI distally. I viewed her Xray and do not see fracture/dislocation. PRICE and motrin recommended. I discussed results, treatment plan, need for follow-up, and return precautions with the patient. Provided opportunity for questions, patient confirmed understanding and are in agreement with plan.    Final Clinical Impressions(s) / ED Diagnoses   Final diagnoses:  Acute pain of left shoulder    ED Discharge Orders    None       Kathyrn Lass 05/23/18 1215    Alvira Monday, MD 05/23/18 2216

## 2019-08-10 ENCOUNTER — Other Ambulatory Visit: Payer: Self-pay | Admitting: Family Medicine

## 2019-08-10 DIAGNOSIS — Z1231 Encounter for screening mammogram for malignant neoplasm of breast: Secondary | ICD-10-CM

## 2019-08-26 ENCOUNTER — Other Ambulatory Visit: Payer: Self-pay

## 2019-08-26 ENCOUNTER — Ambulatory Visit
Admission: RE | Admit: 2019-08-26 | Discharge: 2019-08-26 | Disposition: A | Discharge status: Home / Self Care | Payer: Medicare Other | Source: Ambulatory Visit | Attending: Family Medicine | Admitting: Family Medicine

## 2019-08-26 DIAGNOSIS — Z1231 Encounter for screening mammogram for malignant neoplasm of breast: Secondary | ICD-10-CM

## 2020-01-17 ENCOUNTER — Ambulatory Visit (INDEPENDENT_AMBULATORY_CARE_PROVIDER_SITE_OTHER): Payer: Medicare Other | Admitting: Podiatry

## 2020-01-17 ENCOUNTER — Ambulatory Visit (INDEPENDENT_AMBULATORY_CARE_PROVIDER_SITE_OTHER): Payer: Medicare Other

## 2020-01-17 ENCOUNTER — Other Ambulatory Visit: Payer: Self-pay

## 2020-01-17 ENCOUNTER — Encounter: Payer: Self-pay | Admitting: Podiatry

## 2020-01-17 DIAGNOSIS — M722 Plantar fascial fibromatosis: Secondary | ICD-10-CM | POA: Diagnosis not present

## 2020-01-17 DIAGNOSIS — M7751 Other enthesopathy of right foot: Secondary | ICD-10-CM

## 2020-01-17 DIAGNOSIS — M7750 Other enthesopathy of unspecified foot: Secondary | ICD-10-CM

## 2020-01-17 DIAGNOSIS — M7752 Other enthesopathy of left foot: Secondary | ICD-10-CM

## 2020-01-17 MED ORDER — DICLOFENAC SODIUM 75 MG PO TBEC: 75.0000 mg | DELAYED_RELEASE_TABLET | Freq: Two times a day (BID) | ORAL | 2 refills | Status: DC

## 2020-01-17 NOTE — Patient Instructions (Signed)

## 2020-01-18 NOTE — Progress Notes (Signed)
Subjective:   Patient ID: Tiffany Mueller, female   DOB: 64 y.o.   MRN: [id number]   HPI Patient presents with acute inflammation plantar aspect both heels with the insertional point painful and fluid buildup and not able to walk normally.  Has tried different modalities without relief of symptoms and patient does not smoke likes to be active   Review of Systems  All other systems reviewed and are negative.       Objective:  Physical Exam Vitals and nursing note reviewed.  Constitutional:      Appearance: She is well-developed.  Pulmonary:     Effort: Pulmonary effort is normal.  Musculoskeletal:        General: Normal range of motion.  Skin:    General: Skin is warm.  Neurological:     Mental Status: She is alert.     Neurovascular status intact muscle strength adequate range of motion within normal limits with exquisite discomfort plantar aspect heel region bilateral inflammation fluid around the medial band at insertion point calcaneus     Assessment:  Acute plantar fasciitis bilateral inflammation fluid buildup with moderate depression of the arch noted     Plan:  H&P x-rays reviewed and today I injected the plantar fascia 3 mg Kenalog 5 mg Xylocaine and applied fascial braces after sterile prep.  Applied sterile dressings instructed on stretching exercises anti-inflammatories physical therapy shoe gear modifications and reappoint 2 weeks for recheck  X-rays indicate small spur no indications of stress fracture arthritis

## 2020-01-24 ENCOUNTER — Encounter: Payer: Self-pay | Admitting: Podiatry

## 2020-01-24 ENCOUNTER — Ambulatory Visit (INDEPENDENT_AMBULATORY_CARE_PROVIDER_SITE_OTHER): Payer: Medicare Other | Admitting: Podiatry

## 2020-01-24 ENCOUNTER — Other Ambulatory Visit: Payer: Self-pay

## 2020-01-24 DIAGNOSIS — M722 Plantar fascial fibromatosis: Secondary | ICD-10-CM

## 2020-01-24 NOTE — Progress Notes (Signed)
Subjective:   Patient ID: Tiffany Mueller, female   DOB: 64 y.o.   MRN: [id number]   HPI Patient states overall doing well with continued discomfort in the right plantar heel   ROS      Objective:  Physical Exam  Neurovascular status intact with improvement with pain still noted in the medial band of the plantar fascia but better than it was previously     Assessment:  Doing well post treatment for plantar fasciitis with pain still noted on the right side     Plan:  H&P reviewed conditions and today did a prep of the right medial foot and did do 1 more injection 3 mg Dexasone Kenalog 5 mg Xylocaine and reappoint to recheck

## 2020-08-03 ENCOUNTER — Other Ambulatory Visit: Payer: Self-pay

## 2020-08-03 ENCOUNTER — Encounter: Payer: Self-pay | Admitting: Podiatry

## 2020-08-03 ENCOUNTER — Ambulatory Visit (INDEPENDENT_AMBULATORY_CARE_PROVIDER_SITE_OTHER): Payer: Medicare Other | Admitting: Podiatry

## 2020-08-03 DIAGNOSIS — M722 Plantar fascial fibromatosis: Secondary | ICD-10-CM | POA: Diagnosis not present

## 2020-08-03 MED ORDER — TRIAMCINOLONE ACETONIDE 10 MG/ML IJ SUSP
20.0000 mg | Freq: Once | INTRAMUSCULAR | Status: AC
  Administered 2020-08-03: 20 mg

## 2020-08-03 NOTE — Progress Notes (Signed)
Subjective:   Patient ID: Tiffany Mueller, female   DOB: 65 y.o.   MRN: [id number]   HPI Patient states her mid arch right has been inflamed and sore and her plantar left heel has been sore and she had approximate 6 months relief   ROS      Objective:  Physical Exam  Neurovascular status intact with exquisite discomfort mid arch right and plantar heel left with inflammation fluid above     Assessment:  Acute Planter fasciitis bilateral     Plan:  Sterile prep injected the mid arch right 3 mg Dexasone Kenalog 5 mg Xylocaine in the left plantar heel 3 mg dexamethasone Kenalog 5 mg Xylocaine advised on supportive therapy and reappoint to recheck

## 2020-12-29 ENCOUNTER — Ambulatory Visit (HOSPITAL_COMMUNITY)
Admission: EM | Admit: 2020-12-29 | Discharge: 2020-12-29 | Disposition: A | Discharge status: Home / Self Care | Payer: Medicare Other | Attending: Physician Assistant | Admitting: Physician Assistant

## 2020-12-29 ENCOUNTER — Other Ambulatory Visit: Payer: Self-pay

## 2020-12-29 ENCOUNTER — Encounter (HOSPITAL_COMMUNITY): Payer: Self-pay

## 2020-12-29 DIAGNOSIS — R202 Paresthesia of skin: Secondary | ICD-10-CM | POA: Insufficient documentation

## 2020-12-29 DIAGNOSIS — R200 Anesthesia of skin: Secondary | ICD-10-CM | POA: Insufficient documentation

## 2020-12-29 DIAGNOSIS — G629 Polyneuropathy, unspecified: Secondary | ICD-10-CM | POA: Insufficient documentation

## 2020-12-29 LAB — CBC WITH DIFFERENTIAL/PLATELET
Abs Immature Granulocytes: 0.03 K/uL (ref 0.00–0.07)
Basophils Absolute: 0 K/uL (ref 0.0–0.1)
Basophils Relative: 0 %
Eosinophils Absolute: 0 K/uL (ref 0.0–0.5)
Eosinophils Relative: 0 %
HCT: 39.4 % (ref 36.0–46.0)
Hemoglobin: 13 g/dL (ref 12.0–15.0)
Immature Granulocytes: 0 %
Lymphocytes Relative: 14 %
Lymphs Abs: 1.2 K/uL (ref 0.7–4.0)
MCH: 32.3 pg (ref 26.0–34.0)
MCHC: 33 g/dL (ref 30.0–36.0)
MCV: 97.8 fL (ref 80.0–100.0)
Monocytes Absolute: 0.6 K/uL (ref 0.1–1.0)
Monocytes Relative: 6 %
Neutro Abs: 6.8 K/uL (ref 1.7–7.7)
Neutrophils Relative %: 80 %
Platelets: 313 K/uL (ref 150–400)
RBC: 4.03 MIL/uL (ref 3.87–5.11)
RDW: 16.6 % — ABNORMAL HIGH (ref 11.5–15.5)
WBC: 8.6 K/uL (ref 4.0–10.5)
nRBC: 0 % (ref 0.0–0.2)

## 2020-12-29 LAB — COMPREHENSIVE METABOLIC PANEL
ALT: 20 U/L (ref 0–44)
AST: 34 U/L (ref 15–41)
Albumin: 4.5 g/dL (ref 3.5–5.0)
Alkaline Phosphatase: 75 U/L (ref 38–126)
Anion gap: 12 (ref 5–15)
BUN: 23 mg/dL (ref 8–23)
CO2: 25 mmol/L (ref 22–32)
Calcium: 9.4 mg/dL (ref 8.9–10.3)
Chloride: 98 mmol/L (ref 98–111)
Creatinine, Ser: 1.82 mg/dL — ABNORMAL HIGH (ref 0.44–1.00)
GFR, Estimated: 30 mL/min — ABNORMAL LOW (ref >60)
Glucose, Bld: 108 mg/dL — ABNORMAL HIGH (ref 70–99)
Potassium: 5.3 mmol/L — ABNORMAL HIGH (ref 3.5–5.1)
Sodium: 135 mmol/L (ref 135–145)
Total Bilirubin: 1.2 mg/dL (ref 0.3–1.2)
Total Protein: 6.8 g/dL (ref 6.5–8.1)

## 2020-12-29 LAB — POCT URINALYSIS DIPSTICK, ED / UC
Glucose, UA: NEGATIVE mg/dL
Hgb urine dipstick: NEGATIVE
Ketones, ur: 40 mg/dL — AB
Leukocytes,Ua: NEGATIVE
Nitrite: NEGATIVE
Protein, ur: NEGATIVE mg/dL
Specific Gravity, Urine: 1.015 (ref 1.005–1.030)
Urobilinogen, UA: 2 mg/dL — ABNORMAL HIGH (ref 0.0–1.0)
pH: 6 (ref 5.0–8.0)

## 2020-12-29 LAB — VITAMIN B12: Vitamin B-12: 143 pg/mL — ABNORMAL LOW (ref 180–914)

## 2020-12-29 LAB — SEDIMENTATION RATE: Sed Rate: 3 mm/hr (ref 0–22)

## 2020-12-29 LAB — TSH: TSH: 2.132 u[IU]/mL (ref 0.350–4.500)

## 2020-12-29 NOTE — Discharge Instructions (Addendum)
We will contact you with your lab results if there is anything abnormal that we need to address.  You are not significantly dehydrated but please continue to push fluids.  Make sure to avoid cold as that can be a trigger for similar symptoms.  As we discussed, you may need to see rheumatologist to rule out a condition such as Raynaud's if her symptoms persist and you have discoloration of her hands.  Please follow-up with your primary care provider for further evaluation and management.  If you develop any severe symptoms including weakness, dysarthria, headache, dizziness you to go to emergency room.

## 2020-12-29 NOTE — ED Provider Notes (Signed)
[facility]    CSN: [id number] Arrival date & time: 12/29/20  1717      History   Chief Complaint Chief Complaint  Patient presents with   Numbness    HPI Tiffany Mueller is a 65 y.o. female.   Patient presents today with a 8-hour history of numbness and paresthesias in bilateral hands/fingers and feet/toes.  Reports episodes of similar symptoms in the past but states typically this resolves very quickly and current symptoms have been present for several hours.  She does have a history of degenerative disc disease and chronic back pain which has led to some numbness in her feet but states current symptoms are not similar to previous episodes of this condition.  She does report a cold sensation in hands and toes with associated numbness but denies any discoloration or pallor.  Denies history of Raynaud's or autoimmune condition.  Denies any history of chemotherapy, medication changes, history of diabetes, history of thyroid condition, heavy metal exposure.  She has not tried any over-the-counter medications for symptom management.  She reports this was the first time she was physically active in several years and wonders if she overdid it causing dehydration.  She does have a history of gastric bypass and is not currently taking multivitamins but denies history of vitamin B12 deficiency.  She denies any weakness.   Past Medical History:  Diagnosis Date   ADD (attention deficit disorder)    Anastomotic stricture of gastrojejunostomy 2002   EGD dilation   Anxiety    Arthritis    Carpal tunnel syndrome    Depression    Hypertension    Osteoporosis     Patient Active Problem List   Diagnosis Date Noted   Spinal stenosis of lumbar region 10/13/2017   Lumbar radiculopathy 07/11/2017   Degeneration of lumbar intervertebral disc 06/16/2017   Low back pain 06/16/2017   Osteoporosis 06/16/2017   Distal radius fracture, left 07/04/2015   Ileitis 11/08/2014   History of  gastric bypass 2001 11/08/2014   Abdominal pain 11/08/2014   Never has had a colonoscopy 11/08/2014   Hypertension    Essential hypertension     Past Surgical History:  Procedure Laterality Date   ABDOMINAL HYSTERECTOMY  2007   APPENDECTOMY     CARPAL TUNNEL RELEASE Left 07/04/2015   CARPAL TUNNEL RELEASE Left 07/04/2015   Procedure: LEFT CARPAL TUNNEL RELEASE;  Surgeon: Dominica Severin, MD;  Location: [facility];  Service: Orthopedics;  Laterality: Left;   ESOPHAGOGASTRODUODENOSCOPY (EGD) WITH ESOPHAGEAL DILATION  2002   Dr Randa Evens.  GJ stricture   FRACTURE SURGERY     GASTRIC BYPASS  2001   [facility]   LAPAROSCOPIC CHOLECYSTECTOMY     LAPAROSCOPIC OOPHERECTOMY     LIGAMENT REPAIR Right    "wrist"   OPEN REDUCTION INTERNAL FIXATION (ORIF) DISTAL RADIAL FRACTURE Left 07/04/2015   WITH ALLOGRAFT BONE GRAFTING    OPEN REDUCTION INTERNAL FIXATION (ORIF) DISTAL RADIAL FRACTURE Left 07/04/2015   Procedure: OPEN REDUCTION INTERNAL FIXATION (ORIF) LEFT DISTAL RADIAL FRACTURE WITH ALLOGRAFT BONE GRAFTING;  Surgeon: Dominica Severin, MD;  Location: [facility];  Service: Orthopedics;  Laterality: Left;   STRABISMUS SURGERY Bilateral    "4 on the left; 1 on right"   TUBAL LIGATION      OB History   No obstetric history on file.      Home Medications    Prior to Admission medications   Medication Sig Start Date End Date Taking? Authorizing Provider  alendronate (FOSAMAX) 70 MG tablet Take 70 mg by mouth once a week. 11/12/19   [provider]  ALPRAZolam Prudy Feeler) 1 MG tablet alprazolam 1 mg tablet  1 TABLET DAILY AS NEEDED FOR FLYING AND DENTIST ORALLY 30 DAYS Patient not taking: Reported on 01/17/2020    [provider]  amLODipine (NORVASC) 5 MG tablet Take 10 mg by mouth daily.  Patient not taking: Reported on 01/17/2020    [provider]  amphetamine-dextroamphetamine (ADDERALL) 30 MG tablet Take 30 mg by mouth 2 (two) times daily.    [provider]   bisoprolol-hydrochlorothiazide (ZIAC) 10-6.25 MG tablet bisoprolol 10 mg-hydrochlorothiazide 6.25 mg tablet  TAKE 1 TABLET BY MOUTH EVERY DAY Patient not taking: Reported on 01/17/2020    [provider]  buPROPion (WELLBUTRIN XL) 150 MG 24 hr tablet TAKE 1 TABLET IN THE MORNING ONCE A DAY ORALLY 30 DAY(S) Patient not taking: Reported on 01/17/2020 10/26/14   [provider]  cholestyramine (QUESTRAN) 4 g packet cholestyramine (with sugar) 4 gram oral powder  1 TWICE A DAY ORALLY 30 DAY(S)    [provider]  cloNIDine (CATAPRES) 0.1 MG tablet clonidine HCl 0.1 mg tablet  TAKE 1 TABLET AT BEDTIME TWICE A DAY ORALLY 30 DAY(S) Patient not taking: Reported on 01/17/2020    [provider]  diclofenac (VOLTAREN) 75 MG EC tablet Take 1 tablet (75 mg total) by mouth 2 (two) times daily. 01/17/20   Regal, Kirstie Peri, DPM  fluticasone (FLONASE) 50 MCG/ACT nasal spray fluticasone propionate 50 mcg/actuation nasal spray,suspension  INSTILL 2 SPRAYS IN EACH NOSTRIL ONCE A DAY NASALLY 30    [provider]  furosemide (LASIX) 40 MG tablet Take 40 mg by mouth 2 (two) times daily.    [provider]  HYDROcodone-acetaminophen (NORCO) 5-325 MG tablet Take 1-2 tablets by mouth every 6 (six) hours as needed. Patient not taking: Reported on 01/17/2020 06/30/15   Geoffery Lyons, MD  oxyCODONE (OXY IR/ROXICODONE) 5 MG immediate release tablet Take 1-2 tablets (5-10 mg total) by mouth every 3 (three) hours as needed for moderate pain. Patient not taking: Reported on 01/17/2020 07/06/15   Dominica Severin, MD  predniSONE (DELTASONE) 5 MG tablet prednisone 5 mg tablets in a dose pack  TAKE 6 TABLETS ON DAY 1 AS DIRECTED ON PACKAGE AND DECREASE BY 1 TAB EACH DAY FOR A TOTAL OF 6 DAYS Patient not taking: Reported on 01/17/2020    [provider]  temazepam (RESTORIL) 15 MG capsule Take 15 mg by mouth at bedtime as needed. 12/14/19   [provider]   varenicline (CHANTIX) 1 MG tablet Chantix 1 mg tablet  TAKE 1 TABLET BY MOUTH TWICE A DAY Patient not taking: Reported on 01/17/2020    [provider]  venlafaxine XR (EFFEXOR-XR) 150 MG 24 hr capsule Take 150 mg by mouth daily with breakfast.    [provider]    Family History Family History  Problem Relation Age of Onset   Alzheimer's disease Mother    Breast cancer Mother    Atrial fibrillation Father    Breast cancer Maternal Grandmother     Social History Social History   Tobacco Use   Smoking status: Every Day    Packs/day: 0.50    Years: 45.00    Pack years: 22.50    Types: Cigarettes   Smokeless tobacco: Never  Substance Use Topics   Alcohol use: Yes    Alcohol/week: 28.0 standard drinks    Types:  7 Standard drinks or equivalent, 21 Glasses of wine per week    Comment: 2-3 glass wine nightly   Drug use: No     Allergies   Cefaclor, Cephalosporins, and Diphenhydramine   Review of Systems Review of Systems  Constitutional:  Positive for activity change and fatigue. Negative for appetite change and fever.  Respiratory:  Negative for cough and shortness of breath.   Cardiovascular:  Negative for chest pain.  Gastrointestinal:  Positive for diarrhea. Negative for abdominal pain, nausea and vomiting.  Musculoskeletal:  Negative for arthralgias and myalgias.  Skin:  Negative for color change.  Neurological:  Positive for numbness. Negative for dizziness, speech difficulty, weakness, light-headedness and headaches.    Physical Exam Triage Vital Signs ED Triage Vitals  Enc Vitals Group     BP 12/29/20 1825 (!) 157/63     Pulse Rate 12/29/20 1825 86     Resp 12/29/20 1825 18     Temp 12/29/20 1825 99.1 F (37.3 C)     Temp Source 12/29/20 1825 Oral     SpO2 12/29/20 1825 98 %     Weight --      Height --      Head Circumference --      Peak Flow --      Pain Score 12/29/20 1826 0     Pain Loc --      Pain Edu? --      Excl. in  GC? --    No data found.  Updated Vital Signs BP (!) 157/63 (BP Location: Left Arm)   Pulse 86   Temp 99.1 F (37.3 C) (Oral)   Resp 18   SpO2 98%   Visual Acuity Right Eye Distance:   Left Eye Distance:   Bilateral Distance:    Right Eye Near:   Left Eye Near:    Bilateral Near:     Physical Exam Vitals reviewed.  Constitutional:      General: She is awake. She is not in acute distress.    Appearance: Normal appearance. She is well-developed. She is not ill-appearing.     Comments: Very pleasant female appears stated age in no acute distress sitting comfortably in exam room  HENT:     Head: Normocephalic and atraumatic.     Mouth/Throat:     Mouth: Mucous membranes are moist.     Pharynx: Uvula midline. No oropharyngeal exudate or posterior oropharyngeal erythema.  Cardiovascular:     Rate and Rhythm: Normal rate and regular rhythm.     Heart sounds: Normal heart sounds, S1 normal and S2 normal. No murmur heard. Pulmonary:     Effort: Pulmonary effort is normal.     Breath sounds: Normal breath sounds. No wheezing, rhonchi or rales.     Comments: Clear auscultation bilaterally Abdominal:     Palpations: Abdomen is soft.     Tenderness: There is no abdominal tenderness.  Musculoskeletal:     Right lower leg: No edema.     Left lower leg: No edema.     Comments: Normal active range of motion at all major joints.  Strength 5/5 bilateral upper and lower extremities hands and feet neurovascularly intact.  Feet:     Right foot:     Protective Sensation: 10 sites tested.  10 sites sensed.     Left foot:     Protective Sensation: 10 sites tested.  10 sites sensed.  Psychiatric:        Behavior: Behavior is cooperative.  UC Treatments / Results  Labs (all labs ordered are listed, but only abnormal results are displayed) Labs Reviewed  CBC WITH DIFFERENTIAL/PLATELET - Abnormal; Notable for the following components:      Result Value   RDW 16.6 (*)    All other  components within normal limits  POCT URINALYSIS DIPSTICK, ED / UC - Abnormal; Notable for the following components:   Bilirubin Urine MODERATE (*)    Ketones, ur 40 (*)    Urobilinogen, UA 2.0 (*)    All other components within normal limits  COMPREHENSIVE METABOLIC PANEL  TSH  ANTINUCLEAR ANTIBODIES, IFA  SEDIMENTATION RATE  VITAMIN B12    EKG   Radiology No results found.  Procedures Procedures (including critical care time)  Medications Ordered in UC Medications - No data to display  Initial Impression / Assessment and Plan / UC Course  I have reviewed the triage vital signs and the nursing notes.  Pertinent labs & imaging results that were available during my care of the patient were reviewed by me and considered in my medical decision making (see chart for details).      Unclear etiology of neuropathy on exam.  Patient has high risk for vitamin B12 deficiency given history of gastric bypass and she is not taking any vitamin supplements.  Also concern for Raynaud's given clinical presentation that she denies any significant color change or pallor.  Will obtain basic lab work today.  UA was obtained which showed no evidence of dehydration.  CBC, CMP, TSH, ANA, B12 obtained-results pending.  Patient was encouraged to avoid cold as this is a trigger for neuropathy.  Discussed that she may need to see a rheumatologist or specialist such as neurologist if symptoms persist without identifiable cause.  This would need to be arranged through her primary care provider and so recommend that she follow-up with her PCP within a week if symptoms do not resolve quickly.  Discussed alarm symptoms that warrant emergent evaluation including new onset neurological symptoms.  Strict return precautions given to which she expressed understanding.  Final Clinical Impressions(s) / UC Diagnoses   Final diagnoses:  Neuropathy  Numbness and tingling in both hands  Numbness and tingling of both  feet     Discharge Instructions      We will contact you with your lab results if there is anything abnormal that we need to address.  You are not significantly dehydrated but please continue to push fluids.  Make sure to avoid cold as that can be a trigger for similar symptoms.  As we discussed, you may need to see rheumatologist to rule out a condition such as Raynaud's if her symptoms persist and you have discoloration of her hands.  Please follow-up with your primary care provider for further evaluation and management.  If you develop any severe symptoms including weakness, dysarthria, headache, dizziness you to go to emergency room.     ED Prescriptions   None    PDMP not reviewed this encounter.   Jeani Hawking, PA-C 12/29/20 1946

## 2020-12-29 NOTE — ED Triage Notes (Signed)
Pt c/o numbness to finger tips and toes since working in the garden 10:30 this morning. C/o having nausea and diarrhea off and on today.

## 2021-01-02 LAB — ANTINUCLEAR ANTIBODIES, IFA: ANA Ab, IFA: NEGATIVE

## 2021-01-25 ENCOUNTER — Other Ambulatory Visit: Payer: Self-pay

## 2021-01-25 ENCOUNTER — Inpatient Hospital Stay (HOSPITAL_COMMUNITY)
Admission: RE | Admit: 2021-01-25 | Discharge: 2021-02-06 | DRG: 885 | Disposition: A | Discharge status: Home / Self Care | Payer: Medicare Other | Attending: Psychiatry | Admitting: Psychiatry

## 2021-01-25 ENCOUNTER — Encounter (HOSPITAL_COMMUNITY): Payer: Self-pay | Admitting: Psychiatry

## 2021-01-25 DIAGNOSIS — F4310 Post-traumatic stress disorder, unspecified: Secondary | ICD-10-CM | POA: Diagnosis not present

## 2021-01-25 DIAGNOSIS — F332 Major depressive disorder, recurrent severe without psychotic features: Secondary | ICD-10-CM | POA: Diagnosis present

## 2021-01-25 DIAGNOSIS — F909 Attention-deficit hyperactivity disorder, unspecified type: Secondary | ICD-10-CM | POA: Diagnosis not present

## 2021-01-25 DIAGNOSIS — Z79899 Other long term (current) drug therapy: Secondary | ICD-10-CM

## 2021-01-25 DIAGNOSIS — G4700 Insomnia, unspecified: Secondary | ICD-10-CM | POA: Diagnosis not present

## 2021-01-25 DIAGNOSIS — Z20822 Contact with and (suspected) exposure to covid-19: Secondary | ICD-10-CM | POA: Diagnosis present

## 2021-01-25 DIAGNOSIS — F1020 Alcohol dependence, uncomplicated: Secondary | ICD-10-CM | POA: Diagnosis present

## 2021-01-25 DIAGNOSIS — R45851 Suicidal ideations: Secondary | ICD-10-CM | POA: Diagnosis present

## 2021-01-25 DIAGNOSIS — F10239 Alcohol dependence with withdrawal, unspecified: Secondary | ICD-10-CM | POA: Diagnosis not present

## 2021-01-25 DIAGNOSIS — Z23 Encounter for immunization: Secondary | ICD-10-CM | POA: Diagnosis present

## 2021-01-25 DIAGNOSIS — I10 Essential (primary) hypertension: Secondary | ICD-10-CM | POA: Diagnosis not present

## 2021-01-25 DIAGNOSIS — K30 Functional dyspepsia: Secondary | ICD-10-CM | POA: Diagnosis not present

## 2021-01-25 DIAGNOSIS — F17210 Nicotine dependence, cigarettes, uncomplicated: Secondary | ICD-10-CM | POA: Diagnosis present

## 2021-01-25 LAB — RESP PANEL BY RT-PCR (FLU A&B, COVID) ARPGX2
Influenza A by PCR: NEGATIVE
Influenza B by PCR: NEGATIVE
SARS Coronavirus 2 by RT PCR: NEGATIVE

## 2021-01-25 MED ORDER — PNEUMOCOCCAL VAC POLYVALENT 25 MCG/0.5ML IJ INJ
0.5000 mL | INJECTION | INTRAMUSCULAR | Status: AC
  Administered 2021-01-26: 0.5 mL via INTRAMUSCULAR
  Filled 2021-01-25: qty 0.5

## 2021-01-25 MED ORDER — INFLUENZA VAC A&B SA ADJ QUAD 0.5 ML IM PRSY
0.5000 mL | PREFILLED_SYRINGE | INTRAMUSCULAR | Status: AC
  Administered 2021-01-26: 0.5 mL via INTRAMUSCULAR
  Filled 2021-01-25: qty 0.5

## 2021-01-25 MED ORDER — ONDANSETRON 4 MG PO TBDP: 4.0000 mg | ORAL_TABLET | Freq: Four times a day (QID) | ORAL | Status: AC | PRN

## 2021-01-25 MED ORDER — LORAZEPAM 1 MG PO TABS
1.0000 mg | ORAL_TABLET | Freq: Every day | ORAL | Status: AC
Start: 2021-01-29 — End: 2021-01-29
  Administered 2021-01-29: 1 mg via ORAL
  Filled 2021-01-25: qty 1

## 2021-01-25 MED ORDER — LOPERAMIDE HCL 2 MG PO CAPS
2.0000 mg | ORAL_CAPSULE | ORAL | Status: AC | PRN
  Administered 2021-01-28: 2 mg via ORAL
  Filled 2021-01-25: qty 1

## 2021-01-25 MED ORDER — LORAZEPAM 1 MG PO TABS
1.0000 mg | ORAL_TABLET | Freq: Two times a day (BID) | ORAL | Status: AC
  Administered 2021-01-28 (×2): 1 mg via ORAL
  Filled 2021-01-25 (×3): qty 1

## 2021-01-25 MED ORDER — TRAZODONE HCL 50 MG PO TABS
50.0000 mg | ORAL_TABLET | Freq: Every evening | ORAL | Status: DC | PRN
  Administered 2021-01-25 – 2021-01-26 (×2): 50 mg via ORAL
  Filled 2021-01-25 (×2): qty 1

## 2021-01-25 MED ORDER — HYDROXYZINE HCL 25 MG PO TABS
25.0000 mg | ORAL_TABLET | Freq: Four times a day (QID) | ORAL | Status: AC | PRN
  Filled 2021-01-25: qty 1

## 2021-01-25 MED ORDER — MAGNESIUM HYDROXIDE 400 MG/5ML PO SUSP: 30.0000 mL | Freq: Every day | ORAL | Status: DC | PRN

## 2021-01-25 MED ORDER — ACETAMINOPHEN 325 MG PO TABS
650.0000 mg | ORAL_TABLET | Freq: Four times a day (QID) | ORAL | Status: DC | PRN
Start: 2021-01-25 — End: 2021-02-06
  Administered 2021-01-30 – 2021-02-01 (×3): 650 mg via ORAL
  Filled 2021-01-25 (×3): qty 2

## 2021-01-25 MED ORDER — ALUM & MAG HYDROXIDE-SIMETH 200-200-20 MG/5ML PO SUSP: 30.0000 mL | ORAL | Status: DC | PRN

## 2021-01-25 MED ORDER — ADULT MULTIVITAMIN W/MINERALS CH
1.0000 | ORAL_TABLET | Freq: Every day | ORAL | Status: DC
  Administered 2021-01-26 – 2021-02-06 (×12): 1 via ORAL
  Filled 2021-01-25 (×2): qty 1
  Filled 2021-01-25: qty 7
  Filled 2021-01-25 (×13): qty 1

## 2021-01-25 MED ORDER — THIAMINE HCL 100 MG PO TABS
100.0000 mg | ORAL_TABLET | Freq: Every day | ORAL | Status: DC
  Administered 2021-01-26 – 2021-02-06 (×12): 100 mg via ORAL
  Filled 2021-01-25 (×14): qty 1

## 2021-01-25 MED ORDER — HYDROXYZINE HCL 25 MG PO TABS: 25.0000 mg | ORAL_TABLET | Freq: Three times a day (TID) | ORAL | Status: DC | PRN

## 2021-01-25 MED ORDER — NICOTINE 21 MG/24HR TD PT24
21.0000 mg | MEDICATED_PATCH | Freq: Every day | TRANSDERMAL | Status: DC
Start: 2021-01-26 — End: 2021-02-06
  Administered 2021-01-27 – 2021-02-06 (×10): 21 mg via TRANSDERMAL
  Filled 2021-01-25 (×14): qty 1

## 2021-01-25 MED ORDER — LORAZEPAM 1 MG PO TABS
1.0000 mg | ORAL_TABLET | Freq: Four times a day (QID) | ORAL | Status: AC
  Administered 2021-01-25 – 2021-01-26 (×6): 1 mg via ORAL
  Filled 2021-01-25 (×6): qty 1

## 2021-01-25 MED ORDER — LORAZEPAM 1 MG PO TABS
1.0000 mg | ORAL_TABLET | Freq: Three times a day (TID) | ORAL | Status: AC
  Administered 2021-01-27 (×3): 1 mg via ORAL
  Filled 2021-01-25 (×2): qty 1

## 2021-01-25 MED ORDER — THIAMINE HCL 100 MG/ML IJ SOLN: 100.0000 mg | Freq: Once | INTRAMUSCULAR | Status: DC

## 2021-01-25 MED ORDER — AMLODIPINE BESYLATE 10 MG PO TABS
10.0000 mg | ORAL_TABLET | Freq: Every day | ORAL | Status: DC
  Administered 2021-01-25 – 2021-01-26 (×2): 10 mg via ORAL
  Filled 2021-01-25 (×6): qty 1

## 2021-01-25 MED ORDER — LORAZEPAM 1 MG PO TABS
1.0000 mg | ORAL_TABLET | Freq: Four times a day (QID) | ORAL | Status: AC | PRN
  Filled 2021-01-25: qty 1

## 2021-01-25 NOTE — H&P (Signed)
Behavioral Health Medical Screening Exam  Tiffany Mueller is a 65 y.o. Caucasian female with reported hx of major depressive disorder, ADHD, s/p bypass surgery for weight reduction in 2001, chronic alcoholism & other chronic medical issues. She walked-in to the [facility] voluntarily today seeking mental health evaluation. Patient presented to [facility] seeking inpatient admission. Patient states that she and her husband have been married for forty years and they also worked together. Patient states that she and her husband are having marital issues & are currently enrolled in an online counseling services. She states that they had a rough session yesterday & that led to her drinking immediately after the session at 11 am.  Patient states that she has been drinking daily/heavily for a good while & averages two bottles of wine a day. However, she states that yesterday ,she drank a bottle of wine & four other mixed drinks.  Patient states that her drinking starts earlier in these days, whereas she used to drink in the evenings.  Patient denies any current withdrawal symptoms other than some anxiety.  Patient denies other drug use.  Patient states that she has never been suicidal and she is currently not suicidal, but states that she does not feel safe to return home and states that she is not sure what might happen if she is discharged.  Patient denies HI, but states that she hears music playing day & night.  Patient denies any current mental health provider and states that her PCP has been treating her depression.  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   Patient presents alert and oriented, but depressed, anxious and tearful.  Patient states that she is an alcoholic who is having family issues and states that she cannot contract outside a hospital setting.  Her judgment, insight and impulse control are impaired.  Her thoughts are organized and her memory is  intact.  Patient is very tearful/sad. She does not appear to be responding to internal stimuli. Her speech is normal in tone and rate.  Her eye contact is good.   Total Time spent with patient: 30 minutes  Psychiatric Specialty Exam: Physical Exam Vitals reviewed.  HENT:     Head: Normocephalic.     Nose: Nose normal.     Mouth/Throat:     Pharynx: Oropharynx is clear.  Eyes:     Pupils: Pupils are equal, round, and reactive to light.  Cardiovascular:     Rate and Rhythm: Normal rate.     Pulses: Normal pulses.  Pulmonary:     Effort: Pulmonary effort is normal.  Genitourinary:    Comments: Deferred Musculoskeletal:        General: Normal range of motion.     Cervical back: Normal range of motion.  Skin:    General: Skin is warm and dry.  Neurological:     General: No focal deficit present.     Mental Status: She is alert and oriented to person, place, and time.   Review of Systems  Constitutional:  Negative for chills, diaphoresis and fever.  HENT:  Negative for rhinorrhea, sneezing and sore throat.   Eyes:  Negative for discharge.  Respiratory:  Negative for cough, shortness of breath and wheezing.   Cardiovascular:  Negative for chest pain and palpitations.  Gastrointestinal:  Negative for diarrhea, nausea and vomiting.  Genitourinary:  Negative for difficulty urinating.  Musculoskeletal:  Negative for arthralgias and myalgias.  Skin: Negative.  Allergic/Immunologic: Negative for environmental allergies, food allergies and immunocompromised state.       Allergies: Cefaclor, Cephalosporin  Neurological:  Negative for dizziness, tremors, seizures, syncope, facial asymmetry, speech difficulty, weakness, light-headedness, numbness and headaches.  Psychiatric/Behavioral:  Positive for agitation, decreased concentration, dysphoric mood, hallucinations (Hears music), sleep disturbance and suicidal ideas. Negative for behavioral problems, confusion and self-injury. The patient  is nervous/anxious and is hyperactive.   Blood pressure (!) 156/88, pulse 83, temperature 99.4 F (37.4 C), temperature source Oral, SpO2 100 %.There is no height or weight on file to calculate BMI.  General Appearance: Casual and Fairly Groomed  Eye Contact:  Fair  Speech:  Clear and Coherent and Normal Rate  Volume:  Normal  Mood:  Anxious, Depressed, Dysphoric, and Irritable  Affect:  Depressed, Flat, and Labile  Thought Process:  Coherent, Goal Directed, and Descriptions of Associations: Intact  Orientation:  Full (Time, Place, and Person)  Thought Content:  Hallucinations: "I hear music all the time" and Rumination  Suicidal Thoughts:  No  Homicidal Thoughts:  No  Memory:  Immediate;   Good Recent;   Fair Remote;   Fair  Judgement:  Fair  Insight:  Shallow  Psychomotor Activity:  Restlessness  Concentration: Concentration: Fair and Attention Span: Fair  Recall:  [employer] of Knowledge:Fair  Language: Good  Akathisia:  No  Handed:  Right  AIMS (if indicated):     Assets:  Communication Skills Desire for Improvement Financial Resources/Insurance Housing Resilience Social Support Vocational/Educational  Sleep: Na  Musculoskeletal: Strength & Muscle Tone: within normal limits Gait & Station: normal Patient leans: N/A  Blood pressure (!) 156/88, pulse 83, temperature 99.4 F (37.4 C), temperature source Oral, SpO2 100 %.  Recommendations: Inpatient hospitalization for mental health evaluation/treatments.  Based on my evaluation the patient does not appear to have an emergency medical condition.  Armandina Stammer, NP, pmhnp, fnp-bc 01/25/2021, 1:20 PM

## 2021-01-25 NOTE — Progress Notes (Signed)
Patient did not attend wrap up group. 

## 2021-01-25 NOTE — BHH Group Notes (Signed)
Patient did not attend the relaxation group. 

## 2021-01-25 NOTE — Tx Team (Signed)
Initial Treatment Plan 01/25/2021 11:16 PM Tiffany Mueller PHX:[id number]    PATIENT STRESSORS: Substance abuse     PATIENT STRENGTHS: Supportive family/friends    PATIENT IDENTIFIED PROBLEMS: ETOH Abuse  Poor coping skills                   DISCHARGE CRITERIA:  Adequate post-discharge living arrangements  PRELIMINARY DISCHARGE PLAN: Return to previous living arrangement  PATIENT/FAMILY INVOLVEMENT: This treatment plan has been presented to and reviewed with the patient, [name], and/or family member, .  The patient and family have been given the opportunity to ask questions and make suggestions.  Guadlupe Spanish, RN 01/25/2021, 11:16 PM

## 2021-01-25 NOTE — BHH Group Notes (Signed)
Pt attended crisis management group.  

## 2021-01-25 NOTE — Progress Notes (Signed)
Admission Note:   Patient is 65 yr female who presents as a walk in to [facility] in no acute distress for the treatment of Bi-polar disorder, depression, and ETOH use disorder. Pt appears flat and depressed. Pt was calm and cooperative with admission process. Pt denies SI and contracts for safety upon admission. Pt denies AVH, but stated that she " hears music".   Patient stated that she smokes 1 pack of cigarettes daily and drinks 2 bottles of wine daily in which she is prescribed naltrexone to help with her drinking.   Skin was assessed and found to be clear of any abnormal marks. PT searched and no contraband found, POC and unit policies explained and understanding verbalized. Consents obtained. Food and fluids offered, and fluids accepted. Pt had no additional questions or concerns.

## 2021-01-25 NOTE — Progress Notes (Signed)
   01/25/21 2124  Psych Admission Type (Psych Patients Only)  Admission Status Voluntary  Psychosocial Assessment  Patient Complaints Anxiety;Depression;Insomnia  Eye Contact Fair  Facial Expression Anxious;Sad  Affect Appropriate to circumstance  Speech Logical/coherent  Interaction Assertive  Motor Activity Fidgety  Appearance/Hygiene Unremarkable  Behavior Characteristics Cooperative;Appropriate to situation  Mood Anxious;Depressed  Thought Process  Coherency WDL  Content Blaming self  Delusions None reported or observed  Perception Hallucinations  Hallucination Auditory  Judgment Impaired  Confusion None  Danger to Self  Current suicidal ideation? Denies  Danger to Others  Danger to Others None reported or observed

## 2021-01-25 NOTE — BH Assessment (Signed)
Comprehensive Clinical Assessment (CCA) Note  01/25/2021 Tiffany Mueller [id number]  Disposition:  Per Armandina Stammer, NP, patient is recommended for inpatient treatment  The patient demonstrates the following risk factors for suicide: Chronic risk factors for suicide include: psychiatric disorder of depression and substance use disorder. Acute risk factors for suicide include: family or marital conflict. Protective factors for this patient include: positive social support, responsibility to others (children, family), and hope for the future. Considering these factors, the overall suicide risk at this point appears to be moderate Patient is not appropriate for outpatient follow up because she is an alcoholic who cannot contract outside a hospital setting.   PHQ2-9    Flowsheet Row Ad[facility] (Current) [facility]  PHQ-2 Total Score 3  PHQ-9 Total Score 11      Flowsheet Row Admission (Current) from [facility] ED from [facility]/29/2020 in [facility] RISK CATEGORY No Risk No Risk       Chief Complaint:  Chief Complaint  Patient presents with   Psychiatric Evaluation   Addiction Problem   Depression   Visit Diagnosis: F33.2 MDD Recurrent Severe F10.20 Alcohol Use Disorder Severe    CCA Screening, Triage and Referral (STR)  Patient Reported Information How did you hear about [place]? Self  What Is the Reason for Your Visit/Call Today? Patient presented to [facility] seeking inpatient admission. Patient states that she and her husband have been married for forty years and they also worked together. Patient states that she and her husband are having issues and are in counseling.  She states that they had a rough session yesterday and she states that she started drinking immediately after the session at 11 am.  Patient states that she has been  drinking daily for a good while and states that she averages two bottles of wine daily, but states that yesterday she drank a bottl of wine and four mixed drinks.  Patient states that she has noticed that her drinking is beginning earlier in the day now whereas she used to drink in the evenings.  Patient denies any current withdrawal symptoms other than some anxiety.  Patient denies other drug use.  Patient states that she has never been suicidal and she is currently not suicidal, but states that she does not feel safe to return home and states that she is not sure what might happen if she is discharged.  Patient denies HI, but states that she hears music playing.  Patient denies any current mental health provider and states that her PCP is treating her depression.  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  Patient is alert and oriented, but depressed, anxious and tearful.  Patient states that she is an alcoholic who is having family issues and states that she cannot contract outside a hospital setting.  Her judgment, insight and impulse control are impaired.  Her thoughts are organized and her memory is intact.  Patient is very tearful and sad. She does not appear to be responding to internal stimuli. Her speech is normal in tone and rate.  Her eye contact is good, but she is tearful.  How Long Has This Been Causing You Problems? > than 6 months  What Do You Feel Would Help You the Most Today? Alcohol or Drug Use Treatment; Treatment for Depression or  other mood problem   Have You Recently Had Any Thoughts About Hurting Yourself? No  Are You Planning to Commit Suicide/Harm Yourself At This time? No   Have you Recently Had Thoughts About Hurting Someone Karolee Ohs? No  Are You Planning to Harm Someone at This Time? No  Explanation: No data recorded  Have You Used Any Alcohol or Drugs in the Past 24 Hours? Yes  How Long Ago Did You Use  Drugs or Alcohol? No data recorded What Did You Use and How Much? drank a bottle of wine and 4 mixed drinks yesterday starting at 11 am   Do You Currently Have a Therapist/Psychiatrist? No  Name of Therapist/Psychiatrist: No data recorded  Have You Been Recently Discharged From Any Office Practice or Programs? No  Explanation of Discharge From Practice/Program: No data recorded    CCA Screening Triage Referral Assessment Type of Contact: Face-to-Face  Telemedicine Service Delivery:   Is this Initial or Reassessment? No data recorded Date Telepsych consult ordered in [facility]:  No data recorded Time Telepsych consult ordered in [facility]:  No data recorded Location of Assessment: [facility]  Provider Location: [facility]   Collateral Involvement: no collateral infomation available   Does Patient Have a Court Appointed Legal Guardian? No data recorded Name and Contact of Legal Guardian: No data recorded If Minor and Not Living with Parent(s), Who has Custody? No data recorded Is CPS involved or ever been involved? Never  Is APS involved or ever been involved? Never   Patient Determined To Be At Risk for Harm To Self or Others Based on Review of Patient Reported Information or Presenting Complaint? No (states that she is not sure what would happen if she was discharged from the hospital today)  Method: No data recorded Availability of Means: No data recorded Intent: No data recorded Notification Required: No data recorded Additional Information for Danger to Others Potential: No data recorded Additional Comments for Danger to Others Potential: No data recorded Are There Guns or Other Weapons in Your Home? No data recorded Types of Guns/Weapons: No data recorded Are These Weapons Safely Secured?                            No data recorded Who Could Verify You Are Able To Have These Secured: No data recorded Do You Have any Outstanding Charges, Pending  Court Dates, Parole/Probation? No data recorded Contacted To Inform of Risk of Harm To Self or Others: No data recorded   Does Patient Present under Involuntary Commitment? 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   Mental Health Symptoms Depression:   Change in energy/activity; Hopelessness; Sleep (too much or little); Tearfulness; Worthlessness   Duration of Depressive symptoms:  Duration of Depressive Symptoms: Greater than two weeks   Mania:   None   Anxiety:    Tension; Worrying; Sleep; Irritability   Psychosis:   None   Duration of Psychotic symptoms:    Trauma:   None   Obsessions:   None   Compulsions:   None   Inattention:   None  Hyperactivity/Impulsivity:   None   Oppositional/Defiant Behaviors:   Easily annoyed   Emotional Irregularity:   Intense/unstable relationships; Mood lability; Potentially harmful impulsivity   Other Mood/Personality Symptoms:   depressed, anxious and tearful    Mental Status Exam Appearance and self-care  Stature:   Average   Weight:   Average weight   Clothing:   Casual   Grooming:   Well-groomed   Cosmetic use:   Age appropriate   Posture/gait:   Normal   Motor activity:   Not Remarkable   Sensorium  Attention:   Normal   Concentration:   Normal   Orientation:   Object; Person; Place; Situation; Time   Recall/memory:   Normal   Affect and Mood  Affect:   Depressed; Flat; Anxious   Mood:   Depressed; Anxious   Relating  Eye contact:   Normal   Facial expression:   Depressed    Attitude toward examiner:   Cooperative   Thought and Language  Speech flow:  Clear and Coherent   Thought content:   Appropriate to Mood and Circumstances   Preoccupation:   None   Hallucinations:   Auditory (states that she hears music)   Organization:  No data recorded  [employer] of Knowledge:   Good   Intelligence:   Above Average   Abstraction:   Functional   Judgement:   Impaired   Reality Testing:   Realistic   Insight:   Lacking   Decision Making:   Impulsive   Social Functioning  Social Maturity:   Impulsive   Social Judgement:   Normal   Stress  Stressors:   Family conflict; Relationship   Coping Ability:   Deficient supports; Exhausted; Overwhelmed   Skill Deficits:   None   Supports:   Family     Religion: Religion/Spirituality Are You A Religious Person?:  (not assessed) How Might This Affect Treatment?: not assessed  Leisure/Recreation: Leisure / Recreation Do You Have Hobbies?: No  Exercise/Diet: Exercise/Diet Do You Exercise?: No Have You Gained or Lost A Significant Amount of Weight in the Past Six Months?: Yes-Lost Number of Pounds Lost?:  (unknown) Do You Follow a Special Diet?: Yes Type of Diet: Keto Do You Have Any Trouble Sleeping?: Yes Explanation of Sleeping Difficulties: 3-4 hrs   CCA Employment/Education Employment/Work Situation: Employment / Work Situation Employment Situation: Unemployed Patient's Job has Been Impacted by Current Illness: No Has Patient ever Been in Equities trader?: No  Education: Education Is Patient Currently Attending School?: No Last Grade Completed: 12 Did You Product manager?: Yes What Type of College Degree Do you Have?: BA in Fine Arts Did You Have An Individualized Education Program (IIEP): No Did You Have Any Difficulty At School?: No Patient's Education Has Been Impacted by Current Illness: No   CCA Family/Childhood History Family and Relationship  History: Family history Marital status: Married Number of Years Married: 40 What types of issues is patient dealing with in the relationship?: states that they get tired of one another Does patient have children?: Yes How many children?: 2 How is patient's relationship with their children?: close to her children  Childhood History:  Childhood History By whom was/is the patient raised?: Both parents Did patient suffer any verbal/emotional/physical/sexual abuse as a child?: No Did patient suffer from severe childhood neglect?: No Has patient ever been sexually abused/assaulted/raped as an adolescent or adult?: No Was the patient ever a victim of a crime or a disaster?: No Witnessed domestic violence?:  No Has patient been affected by domestic violence as an adult?: No  Child/Adolescent Assessment:     CCA Substance Use Alcohol/Drug Use: Alcohol / Drug Use Pain Medications: see MAR Prescriptions: See MAR Over the Counter: see MAR History of alcohol / drug use?: Yes Longest period of sobriety (when/how long): none reported Negative Consequences of Use: Personal relationships Withdrawal Symptoms: Other (Comment) (anxiety) Substance #1 Name of Substance 1: alcohol 1 - Age of First Use: unknown 1 - Amount (size/oz): 2 bottles wine 1 - Frequency: daily 1 - Duration: on-going 1 - Last Use / Amount: drank a bottle of wine and 4 mixed driks yesterday 1 - Method of Aquiring: store 1- Route of Use: oral                       ASAM's:  Six Dimensions of Multidimensional Assessment  Dimension 1:  Acute Intoxication and/or Withdrawal Potential:   Dimension 1:  Description of individual's past and current experiences of substance use and withdrawal: Patient has been drinking significant amounts of alcohol and is at high risk for withdrawal complications  Dimension 2:  Biomedical Conditions and Complications:   Dimension 2:  Description of patient's biomedical conditions and   complications: Patient has HTN which is affected by her drinking  Dimension 3:  Emotional, Behavioral, or Cognitive Conditions and Complications:  Dimension 3:  Description of emotional, behavioral, or cognitive conditions and complications: Patient is severely depressed and states that she hears music that no one else hears  Dimension 4:  Readiness to Change:  Dimension 4:  Description of Readiness to Change criteria: Patient states that "I just want to be fixed."  Dimension 5:  Relapse, Continued use, or Continued Problem Potential:  Dimension 5:  Relapse, continued use, or continued problem potential critiera description: Patient lacks coping strategies to keep from drinking  Dimension 6:  Recovery/Living Environment:  Dimension 6:  Recovery/Iiving environment criteria description: Patient lives in an environment is is stressful and has conflict  ASAM Severity Score: ASAM's Severity Rating Score: 12  ASAM Recommended Level of Treatment: ASAM Recommended Level of Treatment: Level III Residential Treatment   Substance use Disorder (SUD) Substance Use Disorder (SUD)  Checklist Symptoms of Substance Use: Continued use despite having a persistent/recurrent physical/psychological problem caused/exacerbated by use, Continued use despite persistent or recurrent social, interpersonal problems, caused or exacerbated by use, Evidence of withdrawal (Comment), Large amounts of time spent to obtain, use or recover from the substance(s), Evidence of tolerance, Persistent desire or unsuccessful efforts to cut down or control use, Recurrent use that results in a failure to fulfill major role obligations (work, school, home), Social, occupational, recreational activities given up or reduced due to use, Substance(s) often taken in larger amounts or over longer times than was intended  Recommendations for Services/Supports/Treatments: Recommendations for Services/Supports/Treatments Recommendations For  Services/Supports/Treatments: Residential-Level 3  Discharge Disposition:    DSM5 Diagnoses: Patient Active Problem List   Diagnosis Date Noted   Alcohol use disorder, severe, dependence (HCC) 01/25/2021   Spinal stenosis of lumbar region 10/13/2017   Lumbar radiculopathy 07/11/2017   Degeneration of lumbar intervertebral disc 06/16/2017   Low back pain 06/16/2017   Osteoporosis 06/16/2017   Distal radius fracture, left 07/04/2015   Ileitis 11/08/2014   History of gastric bypass 2001 11/08/2014   Abdominal pain 11/08/2014   Never has had a colonoscopy 11/08/2014   Hypertension    Essential hypertension      Referrals to Alternative Service(s):  Referred to Alternative Service(s):   Place:   Date:   Time:    Referred to Alternative Service(s):   Place:   Date:   Time:    Referred to Alternative Service(s):   Place:   Date:   Time:    Referred to Alternative Service(s):   Place:   Date:   Time:     Kareem Aul J Lavaughn Bisig, LCAS

## 2021-01-26 ENCOUNTER — Encounter (HOSPITAL_COMMUNITY): Payer: Self-pay

## 2021-01-26 DIAGNOSIS — F332 Major depressive disorder, recurrent severe without psychotic features: Principal | ICD-10-CM

## 2021-01-26 LAB — LIPID PANEL
Cholesterol: 196 mg/dL (ref 0–200)
HDL: 72 mg/dL (ref >40)
LDL Cholesterol: 99 mg/dL (ref 0–99)
Total CHOL/HDL Ratio: 2.7 RATIO
Triglycerides: 127 mg/dL (ref <150)
VLDL: 25 mg/dL (ref 0–40)

## 2021-01-26 LAB — URINALYSIS, ROUTINE W REFLEX MICROSCOPIC
Bilirubin Urine: NEGATIVE
Glucose, UA: NEGATIVE mg/dL
Hgb urine dipstick: NEGATIVE
Ketones, ur: 5 mg/dL — AB
Leukocytes,Ua: NEGATIVE
Nitrite: NEGATIVE
Protein, ur: NEGATIVE mg/dL
Specific Gravity, Urine: 1.014 (ref 1.005–1.030)
pH: 5 (ref 5.0–8.0)

## 2021-01-26 LAB — RAPID URINE DRUG SCREEN, HOSP PERFORMED
Amphetamines: NOT DETECTED
Barbiturates: NOT DETECTED
Benzodiazepines: POSITIVE — AB
Cocaine: NOT DETECTED
Opiates: NOT DETECTED
Tetrahydrocannabinol: NOT DETECTED

## 2021-01-26 LAB — CBC
HCT: 38.4 % (ref 36.0–46.0)
Hemoglobin: 12.7 g/dL (ref 12.0–15.0)
MCH: 33.5 pg (ref 26.0–34.0)
MCHC: 33.1 g/dL (ref 30.0–36.0)
MCV: 101.3 fL — ABNORMAL HIGH (ref 80.0–100.0)
Platelets: 304 10*3/uL (ref 150–400)
RBC: 3.79 MIL/uL — ABNORMAL LOW (ref 3.87–5.11)
RDW: 14.7 % (ref 11.5–15.5)
WBC: 4.8 10*3/uL (ref 4.0–10.5)
nRBC: 0 % (ref 0.0–0.2)

## 2021-01-26 LAB — HEMOGLOBIN A1C
Hgb A1c MFr Bld: 5.1 % (ref 4.8–5.6)
Mean Plasma Glucose: 99.67 mg/dL

## 2021-01-26 LAB — COMPREHENSIVE METABOLIC PANEL
ALT: 19 U/L (ref 0–44)
AST: 23 U/L (ref 15–41)
Albumin: 4.4 g/dL (ref 3.5–5.0)
Alkaline Phosphatase: 59 U/L (ref 38–126)
Anion gap: 12 (ref 5–15)
BUN: 28 mg/dL — ABNORMAL HIGH (ref 8–23)
CO2: 24 mmol/L (ref 22–32)
Calcium: 9.1 mg/dL (ref 8.9–10.3)
Chloride: 101 mmol/L (ref 98–111)
Creatinine, Ser: 1.64 mg/dL — ABNORMAL HIGH (ref 0.44–1.00)
GFR, Estimated: 34 mL/min — ABNORMAL LOW (ref >60)
Glucose, Bld: 110 mg/dL — ABNORMAL HIGH (ref 70–99)
Potassium: 4 mmol/L (ref 3.5–5.1)
Sodium: 137 mmol/L (ref 135–145)
Total Bilirubin: 1.1 mg/dL (ref 0.3–1.2)
Total Protein: 6.7 g/dL (ref 6.5–8.1)

## 2021-01-26 LAB — FOLATE: Folate: 5.1 ng/mL — ABNORMAL LOW (ref >5.9)

## 2021-01-26 LAB — ETHANOL: Alcohol, Ethyl (B): <10 mg/dL (ref <10)

## 2021-01-26 LAB — VITAMIN B12: Vitamin B-12: 718 pg/mL (ref 180–914)

## 2021-01-26 MED ORDER — SERTRALINE HCL 20 MG/ML PO CONC
50.0000 mg | Freq: Every day | ORAL | Status: DC
  Administered 2021-01-27 – 2021-01-29 (×3): 50 mg via ORAL
  Filled 2021-01-26 (×5): qty 2.5

## 2021-01-26 MED ORDER — FLUTICASONE PROPIONATE 50 MCG/ACT NA SUSP: 2.0000 | Freq: Every day | NASAL | Status: DC | PRN

## 2021-01-26 MED ORDER — FUROSEMIDE 40 MG PO TABS
40.0000 mg | ORAL_TABLET | Freq: Every day | ORAL | Status: DC
  Administered 2021-01-26 – 2021-02-06 (×11): 40 mg via ORAL
  Filled 2021-01-26 (×15): qty 1
  Filled 2021-01-26: qty 7

## 2021-01-26 MED ORDER — LOSARTAN POTASSIUM 50 MG PO TABS
50.0000 mg | ORAL_TABLET | Freq: Every day | ORAL | Status: DC
  Administered 2021-01-27 – 2021-02-06 (×10): 50 mg via ORAL
  Filled 2021-01-26 (×3): qty 1
  Filled 2021-01-26: qty 7
  Filled 2021-01-26 (×10): qty 1

## 2021-01-26 MED ORDER — LOSARTAN POTASSIUM 50 MG PO TABS
50.0000 mg | ORAL_TABLET | Freq: Every day | ORAL | Status: DC
  Filled 2021-01-26 (×2): qty 1

## 2021-01-26 MED ORDER — SERTRALINE HCL 20 MG/ML PO CONC
25.0000 mg | Freq: Once | ORAL | Status: AC
Start: 2021-01-26 — End: 2021-01-26
  Administered 2021-01-26: 25 mg via ORAL
  Filled 2021-01-26 (×2): qty 1.25

## 2021-01-26 NOTE — BHH Group Notes (Signed)
Adult Psychoeducational Group Note  Date:  01/26/2021 Time:  5:09 PM  Group Topic/Focus:  Coping With Mental Health Crisis:   The purpose of this group is to help patients identify strategies for coping with mental health crisis.  Group discusses possible causes of crisis and ways to manage them effectively.  Participation Level:  Did Not Attend    Baldwin [place] 01/26/2021, 5:09 PM

## 2021-01-26 NOTE — H&P (Signed)
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 Secondary Diagnosis: Principal Problem:   Major depressive disorder, recurrent episode, severe (HCC) Active Problems:   Alcohol use disorder, severe, dependence (HCC)  Long Term Goal(s): Improvement in symptoms so as ready for discharge  Short Term Goals: Ability to identify and develop effective coping behaviors will improve, Compliance with prescribed medications will improve, and Ability to identify triggers associated with substance abuse/mental health issues will improve  I certify that inpatient services furnished can reasonably be expected to improve the patient's condition.    Armandina Stammer, NP, pmhnp, fnp-bc 10/21/20222:30 PM

## 2021-01-26 NOTE — Progress Notes (Signed)
Adult Psychoeducational Group Note  Date:  01/26/2021 Time:  8:35 PM  Group Topic/Focus:  Wrap-Up Group:   The focus of this group is to help patients review their daily goal of treatment and discuss progress on daily workbooks.  Participation Level:  Active  Participation Quality:  Appropriate  Affect:  Anxious and Excited  Cognitive:  Disorganized and Confused  Insight: Appropriate  Engagement in Group:  Improving  Modes of Intervention:  Discussion  Additional Comments:  Pt stated her goal for today was to focus on her treatment plan. Pt stated she accomplished her goal today. Pt stated she talked with her doctor and her social worker about her care today. Pt rated her overall day a 9 out of 10. Pt stated she has been confused and disorganized all day. Pt stated she was able to contact her husband today and seeing her husband at visitation tonight improved her overall day. Pt stated she felt better about herself tonight. Pt stated she was able to attend all groups held today. Pt stated she was able to attend all meals. Pt stated she took all medications provided today. Pt stated her appetite was pretty good today. Pt rated her sleep last night was pretty good. Pt stated the goal tonight was to get some rest. Pt stated she had no physical pain today. Pt deny visual hallucinations tonight. Pt admitted to experiencing some auditory issues tonight. Pt stated she has heard music in in her head all day long. Pt nurse was updated on situation. Pt denies thoughts of harming herself or others. Pt stated she would alert staff if anything changed.  Tiffany Mueller 01/26/2021, 8:35 PM
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
## 2021-01-26 NOTE — Group Note (Signed)
Recreation Therapy Group Note   Group Topic:Stress Management  Group Date: 01/26/2021 Start Time: 0930 End Time: 0945 Facilitators: Caroll Rancher, LRT/CTRS Location: [address] Dayroom  Goal Area(s) Addresses:  Patient will actively participate in stress management techniques presented during session.  Patient will successfully identify benefit of practicing stress management post d/c.   Group Description: Meditation.  LRT played a meditation that focused on looking at each day as a new beginning to try new things, make a new friend, take time for yourself or make peace with any individual you may have an issue with.  Patients were to listen and follow along as meditation played to engage in activity.   Affect/Mood: N/A   Participation Level: Did not attend     Clinical Observations/Individualized Feedback: Pt did not attend group.     Plan: Continue to engage patient in RT group sessions 2-3x/week.   Caroll Rancher, LRT/CTRS 01/26/2021 12:15 PM

## 2021-01-26 NOTE — Group Note (Signed)
LCSW Group Therapy Note  Group Date: 01/26/2021 Start Time: 1300 End Time: 1330   Type of Therapy and Topic:  Group Therapy - Healthy vs Unhealthy Coping Skills  Participation Level:  Active   Description of Group The focus of this group was to determine what unhealthy coping techniques typically are used by group members and what healthy coping techniques would be helpful in coping with various problems. Patients were guided in becoming aware of the differences between healthy and unhealthy coping techniques. Patients were asked to identify 2-3 healthy coping skills they would like to learn to use more effectively.  Therapeutic Goals Patients learned that coping is what human beings do all day long to deal with various situations in their lives Patients defined and discussed healthy vs unhealthy coping techniques Patients identified their preferred coping techniques and identified whether these were healthy or unhealthy Patients determined 2-3 healthy coping skills they would like to become more familiar with and use more often. Patients provided support and ideas to each other   Summary of Patient Progress:   Due to the acuity and complex discharge plans, group was not held. Patient was provided therapeutic worksheets and asked to meet with CSW as needed.    Therapeutic Modalities Cognitive Behavioral Therapy Motivational Interviewing  Bethel Sirois M Mackenize Delgadillo, LCSWA 01/26/2021  1:19 PM   

## 2021-01-26 NOTE — B[facility] Counselor (Signed)
Tiffany Mueller, patient, provided written consent for CSW team to coordinate aftercare at this time. CSW to have further conversations with patient regarding care in the community.   Patient interested in EMDR referral. Receives psych medications from unknown PCP.   Signed:  Corky Crafts, MSW, [place], LCASA 01/26/2021 3:43 PM

## 2021-01-26 NOTE — B[facility] Suicide Risk Assessment (Signed)
[facility] INPATIENT:  Family/Significant Other Suicide Prevention Education  Suicide Prevention Education:  Education Completed; Joya Gaskins (spouse),  (name of family member/significant other) has been identified by the patient as the family member/significant other with whom the patient will be residing, and identified as the person(s) who will aid the patient in the event of a mental health crisis (suicidal ideations/suicide attempt).  With written consent from the patient, the family member/significant other has been provided the following suicide prevention education, prior to the and/or following the discharge of the patient.  The suicide prevention education provided includes the following: Suicide risk factors Suicide prevention and interventions National Suicide Hotline telephone number [facility] assessment telephone number [facility] Emergency Assistance 911 [facility] and/or Residential Mobile Crisis Unit telephone number  Request made of family/significant other to: Remove weapons (e.g., guns, rifles, knives), all items previously/currently identified as safety concern.   Remove drugs/medications (over-the-counter, prescriptions, illicit drugs), all items previously/currently identified as a safety concern.  The family member/significant other verbalizes understanding of the suicide prevention education information provided.  The family member/significant other agrees to remove the items of safety concern listed above.  Spouse reports that her alcohol use has increased to roughly 1 box per 2 days. While she is intoxicated she becomes belligerent and reclusive. States that she has continues to have issues with body image.   Corky Crafts 01/26/2021, 3:53 PM
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
## 2021-01-26 NOTE — Progress Notes (Signed)
   01/26/21 0800  Psych Admission Type (Psych Patients Only)  Admission Status Voluntary  Psychosocial Assessment  Patient Complaints Anxiety;Depression  Eye Contact Fair  Facial Expression Anxious;Sad  Affect Appropriate to circumstance  Speech Logical/coherent  Interaction Assertive  Motor Activity Fidgety  Appearance/Hygiene Unremarkable  Behavior Characteristics Cooperative  Mood Anxious;Depressed  Thought Process  Coherency WDL  Content Blaming self  Delusions None reported or observed  Perception Hallucinations  Hallucination Auditory  Judgment Impaired  Confusion None  Danger to Self  Current suicidal ideation? Denies  Danger to Others  Danger to Others None reported or observed    Pt was groggy this morning and said, "Trazodone really knocked me out."  Pt is talkative with peers.  Pt is calm and cooperative.  Pt remains safe on the unit with q 15 min checks in place.

## 2021-01-26 NOTE — Progress Notes (Signed)
   01/26/21 2020  Psych Admission Type (Psych Patients Only)  Admission Status Voluntary  Psychosocial Assessment  Patient Complaints Anxiety;Depression  Eye Contact Fair  Facial Expression Anxious;Sad  Affect Appropriate to circumstance  Speech Logical/coherent  Interaction Assertive  Motor Activity Fidgety  Appearance/Hygiene Unremarkable  Behavior Characteristics Cooperative;Anxious  Mood Anxious;Depressed  Thought Process  Coherency WDL  Content WDL  Delusions None reported or observed  Perception Hallucinations  Hallucination Auditory (music)  Judgment Impaired  Confusion None  Danger to Self  Current suicidal ideation? Denies  Danger to Others  Danger to Others None reported or observed   Pt seen at nurse's station.  Pt denies SI, HI, VH and pain. Pt endorses AH as hearing music. Pt says she has plantar fasciitis but gets cortisone injections to control the pain. Pt rates anxiety 4/10 and depression 7/10. Pt asked for her trazodone early saying that taking it late makes her groggy in the morning. Pt denies any withdrawal symptoms. Says her last drink was Wednesday. BAL <10.

## 2021-01-26 NOTE — [facility] Group Notes (Signed)
Pt did not attend goal setting group.

## 2021-01-26 NOTE — BH IP Treatment Plan (Signed)
Interdisciplinary Treatment and Diagnostic Plan Update  01/26/2021 Time of Session:  Tiffany Mueller MRN: [id number]  Principal Diagnosis: Major depressive disorder, recurrent episode, severe ([place])  Secondary Diagnoses: Principal Problem:   Major depressive disorder, recurrent episode, severe ([place]) Active Problems:   Alcohol use disorder, severe, dependence ([place])   Current Medications:  Current Facility-Administered Medications  Medication Dose Route Frequency Provider Last Rate Last Admin   acetaminophen (TYLENOL) tablet 650 mg  650 mg Oral Q6H PRN Nwoko, Herbert Pun I, NP       alum & mag hydroxide-simeth (MAALOX/MYLANTA) 200-200-20 MG/5ML suspension 30 mL  30 mL Oral Q4H PRN Nwoko, Agnes I, NP       amLODipine (NORVASC) tablet 10 mg  10 mg Oral Daily Lindell Spar I, NP   10 mg at 01/26/21 2831   hydrOXYzine (ATARAX/VISTARIL) tablet 25 mg  25 mg Oral Q6H PRN Lindell Spar I, NP       loperamide (IMODIUM) capsule 2-4 mg  2-4 mg Oral PRN Nwoko, Agnes I, NP       LORazepam (ATIVAN) tablet 1 mg  1 mg Oral Q6H PRN Lindell Spar I, NP       LORazepam (ATIVAN) tablet 1 mg  1 mg Oral QID Lindell Spar I, NP   1 mg at 01/26/21 1303   Followed by   Derrill Memo ON 01/27/2021] LORazepam (ATIVAN) tablet 1 mg  1 mg Oral TID Lindell Spar I, NP       Followed by   Derrill Memo ON 01/28/2021] LORazepam (ATIVAN) tablet 1 mg  1 mg Oral BID Nwoko, Agnes I, NP       Followed by   Derrill Memo ON 01/29/2021] LORazepam (ATIVAN) tablet 1 mg  1 mg Oral Daily Nwoko, Agnes I, NP       magnesium hydroxide (MILK OF MAGNESIA) suspension 30 mL  30 mL Oral Daily PRN Lindell Spar I, NP       multivitamin with minerals tablet 1 tablet  1 tablet Oral Daily Lindell Spar I, NP   1 tablet at 01/26/21 5176   nicotine (NICODERM CQ - dosed in mg/24 hours) patch 21 mg  21 mg Transdermal Q0600 Lindell Spar I, NP       ondansetron (ZOFRAN-ODT) disintegrating tablet 4 mg  4 mg Oral Q6H PRN Lindell Spar I, NP       sertraline (ZOLOFT) 20 MG/ML  concentrated solution 25 mg  25 mg Oral Once Lindell Spar I, NP       [START ON 01/27/2021] sertraline (ZOLOFT) 20 MG/ML concentrated solution 50 mg  50 mg Oral Daily Nwoko, Agnes I, NP       thiamine (B-1) injection 100 mg  100 mg Intramuscular Once Lindell Spar I, NP       thiamine tablet 100 mg  100 mg Oral Daily Nwoko, Agnes I, NP   100 mg at 01/26/21 1607   traZODone (DESYREL) tablet 50 mg  50 mg Oral QHS PRN Lindell Spar I, NP   50 mg at 01/25/21 2104   PTA Medications: Medications Prior to Admission  Medication Sig Dispense Refill Last Dose   amphetamine-dextroamphetamine (ADDERALL) 30 MG tablet Take 30 mg by mouth 2 (two) times daily.   01/25/2021   buPROPion (WELLBUTRIN XL) 150 MG 24 hr tablet Take 150 mg by mouth daily.  5 01/25/2021   fluticasone (FLONASE) 50 MCG/ACT nasal spray Place 2 sprays into both nostrils daily as needed for allergies.   Past Week   furosemide (LASIX) 40 MG tablet  Take 40 mg by mouth daily.   01/25/2021   Multiple Vitamins-Minerals (MULTIVITAMIN GUMMIES ADULT PO) Take 1 tablet by mouth daily.   Past Week   naltrexone (DEPADE) 50 MG tablet Take 50 mg by mouth in the morning, at noon, and at bedtime.   01/24/2021   temazepam (RESTORIL) 15 MG capsule Take 15 mg by mouth at bedtime as needed for sleep.   2 weeks ago   losartan (COZAAR) 50 MG tablet Take 50 mg by mouth daily.       Patient Stressors: Substance abuse    Patient Strengths: Supportive family/friends   Treatment Modalities: Medication Management, Group therapy, Case management,  1 to 1 session with clinician, Psychoeducation, Recreational therapy.   Physician Treatment Plan for Primary Diagnosis: Major depressive disorder, recurrent episode, severe ([place]) Long Term Goal(s): Improvement in symptoms so as ready for discharge   Short Term Goals: Ability to identify changes in lifestyle to reduce recurrence of condition will improve Ability to verbalize feelings will improve Ability to disclose  and discuss suicidal ideas Ability to demonstrate self-control will improve Ability to identify and develop effective coping behaviors will improve Ability to maintain clinical measurements within normal limits will improve Compliance with prescribed medications will improve Ability to identify triggers associated with substance abuse/mental health issues will improve  Medication Management: Evaluate patient's response, side effects, and tolerance of medication regimen.  Therapeutic Interventions: 1 to 1 sessions, Unit Group sessions and Medication administration.  Evaluation of Outcomes: Not Met  Physician Treatment Plan for Secondary Diagnosis: Principal Problem:   Major depressive disorder, recurrent episode, severe ([place]) Active Problems:   Alcohol use disorder, severe, dependence ([place])  Long Term Goal(s): Improvement in symptoms so as ready for discharge   Short Term Goals: Ability to identify changes in lifestyle to reduce recurrence of condition will improve Ability to verbalize feelings will improve Ability to disclose and discuss suicidal ideas Ability to demonstrate self-control will improve Ability to identify and develop effective coping behaviors will improve Ability to maintain clinical measurements within normal limits will improve Compliance with prescribed medications will improve Ability to identify triggers associated with substance abuse/mental health issues will improve     Medication Management: Evaluate patient's response, side effects, and tolerance of medication regimen.  Therapeutic Interventions: 1 to 1 sessions, Unit Group sessions and Medication administration.  Evaluation of Outcomes: Not Met   RN Treatment Plan for Primary Diagnosis: Major depressive disorder, recurrent episode, severe ([place]) Long Term Goal(s): Knowledge of disease and therapeutic regimen to maintain health will improve  Short Term Goals: Ability to participate in decision making  will improve, Ability to verbalize feelings will improve, and Ability to identify and develop effective coping behaviors will improve  Medication Management: RN will administer medications as ordered by provider, will assess and evaluate patient's response and provide education to patient for prescribed medication. RN will report any adverse and/or side effects to prescribing provider.  Therapeutic Interventions: 1 on 1 counseling sessions, Psychoeducation, Medication administration, Evaluate responses to treatment, Monitor vital signs and CBGs as ordered, Perform/monitor CIWA, COWS, AIMS and Fall Risk screenings as ordered, Perform wound care treatments as ordered.  Evaluation of Outcomes: Not Met   LCSW Treatment Plan for Primary Diagnosis: Major depressive disorder, recurrent episode, severe ([place]) Long Term Goal(s): Safe transition to appropriate next level of care at discharge, Engage patient in therapeutic group addressing interpersonal concerns.  Short Term Goals: Engage patient in aftercare planning with referrals and resources, Increase social support, and Increase  ability to appropriately verbalize feelings  Therapeutic Interventions: Assess for all discharge needs, 1 to 1 time with Social worker, Explore available resources and support systems, Assess for adequacy in community support network, Educate family and significant other(s) on suicide prevention, Complete Psychosocial Assessment, Interpersonal group therapy.  Evaluation of Outcomes: Not Met   Progress in Treatment: Attending groups: Yes. and No. Participating in groups: No. Taking medication as prescribed: Yes. Toleration medication: Yes. Family/Significant other contact made: No, will contact:  CSW will obtain consent Patient understands diagnosis: Yes. Discussing patient identified problems/goals with staff: Yes. Medical problems stabilized or resolved: Yes. Denies suicidal/homicidal ideation: Yes. Issues/concerns  per patient self-inventory: No. Other: None  New problem(s) identified: No, Describe:  None  New Short Term/Long Term Goal(s):medication stabilization, elimination of SI thoughts, development of comprehensive mental wellness plan.   Patient Goals: "To be fixed. I have depression, I am angry, bad body image, grief, I am hurting those around me and I drink and smoke and stay home because I don't want anyone to see me."  Discharge Plan or Barriers: Patient recently admitted. CSW will continue to follow and assess for appropriate referrals and possible discharge planning.   Reason for Continuation of Hospitalization: Medication stabilization Suicidal ideation Withdrawal symptoms  Estimated Length of Stay: 3-5 days   Scribe for Treatment Team: Eliott Nine 01/26/2021 2:50 PM

## 2021-01-27 DIAGNOSIS — F1020 Alcohol dependence, uncomplicated: Secondary | ICD-10-CM

## 2021-01-27 MED ORDER — TRAZODONE HCL 50 MG PO TABS
25.0000 mg | ORAL_TABLET | Freq: Every evening | ORAL | Status: DC | PRN
  Administered 2021-01-28: 25 mg via ORAL
  Filled 2021-01-27: qty 1

## 2021-01-27 NOTE — Progress Notes (Signed)
Adult Psychoeducational Group Note  Date:  01/27/2021 Time:  9:00 PM  Group Topic/Focus:  Wrap-Up Group:   The focus of this group is to help patients review their daily goal of treatment and discuss progress on daily workbooks.  Participation Level:  Minimal  Participation Quality:  Appropriate  Affect:  Anxious, Depressed, and Irritable  Cognitive:  Disorganized and Confused  Insight: Lacking and Limited  Engagement in Group:  Lacking, Limited, and Poor  Modes of Intervention:  Discussion  Additional Comments:    Pt stated her goal for today was to focus on her treatment plan. Pt stated she accomplished her goal today. Pt stated she talked with her doctor and her social worker about her care today. Pt rated her overall day a 5 out of 10. Pt stated she has been confused and disorganized all day. Pt stated she was able to contact her son today and seeing her son at visitation tonight improved her overall day. Pt stated she felt better about herself tonight. Pt stated she was able to attend all groups held today. Pt stated she was able to attend all meals. Pt stated she took all medications provided today. Pt stated her appetite was fair today. Pt rated her sleep last night was pretty good. Pt stated the goal tonight was to get some rest. Pt stated she had some physical pain today. Pt stated she had a headache tonight. Pt rated the headache a 1 on the pain level scale. Pt nurse was updated on situation. Pt deny visual hallucinations and auditory issues tonight.  Pt denies thoughts of harming herself or others. Pt stated she would alert staff if anything changed.  Felipa Furnace 01/27/2021, 9:00 PM

## 2021-01-27 NOTE — Progress Notes (Addendum)
   01/27/21 1200  Psych Admission Type (Psych Patients Only)  Admission Status Voluntary  Psychosocial Assessment  Eye Contact Fair  Facial Expression Anxious;Sad  Affect Appropriate to circumstance  Speech Logical/coherent  Interaction Assertive  Motor Activity Fidgety  Appearance/Hygiene Unremarkable  Thought Process  Coherency WDL  Content WDL  Delusions None reported or observed  Perception Hallucinations  Hallucination Auditory (music)  Judgment Impaired  Confusion None  Danger to Self  Current suicidal ideation? Denies  Danger to Others  Danger to Others None reported or observed   D. Pt presents as anxious and fidgety - friendly during interactions. Pt rated her depression a 6/10, and her anxiety a 7/10 today. Pt reported that she has felt sleepy throughout the day and is requesting that her sleep medicine be "cut in half"Pt did not attend groups this morning, stating that she was sleepy.  Pt currently denies SI/HI and AVH  A. Labs and vitals monitored. Pt given and educated on medications. Pt supported emotionally and encouraged to express concerns and ask questions.   R. Pt remains safe with 15 minute checks. Will continue POC.

## 2021-01-27 NOTE — Progress Notes (Signed)
[name] MD Progress Note  01/27/2021 3:41 PM ROYA GIESELMAN  MRN:  [id number]  Subjective: Tiffany Mueller reports, "I feel like I'm sleeping all the time since being here. I don't mind the sleep because I need it, but I do not want to feel wobbly when I get up in the morning or stay drowsy all day. Can the sleep medicine be cut in half? I will describe my mood today as scary. I can just use cigarette right now. Cigarette calms me down a lot. I'm now thinking about going to rehabilitation treatment program after discharge. I know I need it.  My depression is #6 & anxiety #7 today". Daily notes: [name] is seen, chart reviewed. The chart findings discussed with the treatment team. She presents alert, oriented & aware of situation. She is visible on the unit, attending group sessions. She is complaining of sleeping too much. Has asked to decrease her Trazodone dose in half to reduce excessive drowsiness in am. Patient is now considering going to a rehabilitation treatment center after discharge to deal with her chronic alcoholism. She says she had a good visit from her husband last night. She considers her husband a good support system. [name] is taking & tolerating her treatment regimen except the Trazodone which she says is causing her excessive drowsiness. She denies any SIHI, VH, delusional thoughts or paranoia. She continues to endorse hearing music at all time. This is not a new problem for patient. She is waiting to speak with SW on Monday to determine the bast treatment option for her for her alcoholism. Patient made it clear does not want to go to [facility]. Says her friend works there. Tiffany Mueller is in agreement to continue current plan of care as already in progress. Vital signs stable. Decreased Trazodone to 25 mg po Q hs prn for insomnia.  Objective: 65 year old female with past psychiatric history of major depressive disorder and alcohol use disorder was admitted to the psychiatric unit for safety and  evaluation and treatment of worsening depression and suicidal thoughts.  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  Denies history of suicide attempt.  Principal Problem: Major depressive disorder, recurrent episode, severe (HCC)  Diagnosis: Principal Problem:   Major depressive disorder, recurrent episode, severe (HCC) Active Problems:   Alcohol use disorder, severe, dependence (HCC)  Total Time spent with patient:  35 minutes  Past Psychiatric History: Alcohol use disorder, Major depressive disorder.  Past Medical History:  Past Medical History:  Diagnosis Date   ADD (attention deficit disorder)    Anastomotic stricture of gastrojejunostomy 2002   EGD dilation   Anxiety    Arthritis    Carpal tunnel syndrome    Depression    Hypertension    Osteoporosis     Past Surgical History:  Procedure Laterality Date   ABDOMINAL HYSTERECTOMY  2007   APPENDECTOMY     CARPAL TUNNEL RELEASE Left 07/04/2015   CARPAL TUNNEL RELEASE Left 07/04/2015   Procedure: LEFT CARPAL TUNNEL RELEASE;  Surgeon: Tiffany Severin, MD;  Location: [facility] OR;  Service: Orthopedics;  Laterality: Left;   ESOPHAGOGASTRODUODENOSCOPY (EGD) WITH ESOPHAGEAL DILATION  2002   Dr Tiffany Mueller.  GJ stricture   FRACTURE SURGERY     GASTRIC BYPASS  2001   [facility]   LAPAROSCOPIC CHOLECYSTECTOMY     LAPAROSCOPIC OOPHERECTOMY     LIGAMENT REPAIR Right    "wrist"   OPEN REDUCTION INTERNAL FIXATION (ORIF) DISTAL RADIAL FRACTURE Left 07/04/2015  WITH ALLOGRAFT BONE GRAFTING    OPEN REDUCTION INTERNAL FIXATION (ORIF) DISTAL RADIAL FRACTURE Left 07/04/2015   Procedure: OPEN REDUCTION INTERNAL FIXATION (ORIF) LEFT DISTAL RADIAL FRACTURE WITH ALLOGRAFT BONE GRAFTING;  Surgeon: Tiffany Severin, MD;  Location: [facility] OR;  Service: Orthopedics;  Laterality: Left;   STRABISMUS SURGERY Bilateral    "4 on the left; 1 on right"   TUBAL LIGATION     Family History:  Family History  Problem  Relation Age of Onset   Alzheimer's disease Mother    Breast cancer Mother    Atrial fibrillation Father    Breast cancer Maternal Grandmother    Family Psychiatric  History: Bipolar disorder: Son.                                                  Alcoholism: Son. Social History:  Social History   Substance and Sexual Activity  Alcohol Use Yes   Alcohol/week: 28.0 standard drinks   Types: 7 Standard drinks or equivalent, 21 Glasses of wine per week   Comment: 2-3 glass wine nightly     Social History   Substance and Sexual Activity  Drug Use No    Social History   Socioeconomic History   Marital status: Married    Spouse name: Not on file   Number of children: Not on file   Years of education: Not on file   Highest education level: Not on file  Occupational History   Not on file  Tobacco Use   Smoking status: Every Day    Packs/day: 0.50    Years: 45.00    Pack years: 22.50    Types: Cigarettes   Smokeless tobacco: Never  Substance and Sexual Activity   Alcohol use: Yes    Alcohol/week: 28.0 standard drinks    Types: 7 Standard drinks or equivalent, 21 Glasses of wine per week    Comment: 2-3 glass wine nightly   Drug use: No   Sexual activity: Yes  Other Topics Concern   Not on file  Social History Narrative   Not on file   Social Determinants of Health   Financial Resource Strain: Not on file  Food Insecurity: Not on file  Transportation Needs: Not on file  Physical Activity: Not on file  Stress: Not on file  Social Connections: Not on file   Additional Social History:    Pain Medications: see MAR Prescriptions: See MAR Over the Counter: see MAR History of alcohol / drug use?: Yes Longest period of sobriety (when/how long): none reported Negative Consequences of Use: Personal relationships Withdrawal Symptoms: Other (Comment) (anxiety) Name of Substance 1: alcohol 1 - Age of First Use: unknown 1 - Amount (size/oz): 2 bottles wine 1 - Frequency:  daily 1 - Duration: on-going 1 - Last Use / Amount: drank a bottle of wine and 4 mixed driks yesterday 1 - Method of Aquiring: store 1- Route of Use: oral  Sleep: Good  Appetite:  Good  Current Medications: Current Facility-Administered Medications  Medication Dose Route Frequency Provider Last Rate Last Admin   acetaminophen (TYLENOL) tablet 650 mg  650 mg Oral Q6H PRN Reika Callanan I, NP       alum & mag hydroxide-simeth (MAALOX/MYLANTA) 200-200-20 MG/5ML suspension 30 mL  30 mL Oral Q4H PRN Armandina Stammer I, NP       fluticasone (  FLONASE) 50 MCG/ACT nasal spray 2 spray  2 spray Each Nare Daily PRN Armandina Stammer I, NP       furosemide (LASIX) tablet 40 mg  40 mg Oral Daily Armandina Stammer I, NP   40 mg at 01/27/21 1610   hydrOXYzine (ATARAX/VISTARIL) tablet 25 mg  25 mg Oral Q6H PRN Armandina Stammer I, NP       loperamide (IMODIUM) capsule 2-4 mg  2-4 mg Oral PRN Armandina Stammer I, NP       LORazepam (ATIVAN) tablet 1 mg  1 mg Oral Q6H PRN Armandina Stammer I, NP       LORazepam (ATIVAN) tablet 1 mg  1 mg Oral TID Armandina Stammer I, NP   1 mg at 01/27/21 1300   Followed by   Melene Muller ON 01/28/2021] LORazepam (ATIVAN) tablet 1 mg  1 mg Oral BID Armandina Stammer I, NP       Followed by   Melene Muller ON 01/29/2021] LORazepam (ATIVAN) tablet 1 mg  1 mg Oral Daily Jordynn Marcella I, NP       losartan (COZAAR) tablet 50 mg  50 mg Oral Daily Armandina Stammer I, NP   50 mg at 01/27/21 9604   magnesium hydroxide (MILK OF MAGNESIA) suspension 30 mL  30 mL Oral Daily PRN Armandina Stammer I, NP       multivitamin with minerals tablet 1 tablet  1 tablet Oral Daily Armandina Stammer I, NP   1 tablet at 01/27/21 5409   nicotine (NICODERM CQ - dosed in mg/24 hours) patch 21 mg  21 mg Transdermal Q0600 Armandina Stammer I, NP   21 mg at 01/27/21 0838   ondansetron (ZOFRAN-ODT) disintegrating tablet 4 mg  4 mg Oral Q6H PRN Armandina Stammer I, NP       sertraline (ZOLOFT) 20 MG/ML concentrated solution 50 mg  50 mg Oral Daily Armandina Stammer I, NP   50 mg at  01/27/21 8119   thiamine (B-1) injection 100 mg  100 mg Intramuscular Once Armandina Stammer I, NP       thiamine tablet 100 mg  100 mg Oral Daily Armandina Stammer I, NP   100 mg at 01/27/21 1478   traZODone (DESYREL) tablet 25 mg  25 mg Oral QHS PRN Armandina Stammer I, NP       Lab Results:  Results for orders placed or performed during the hospital encounter of 01/25/21 (from the past 48 hour(s))  Hemoglobin A1c     Status: None   Collection Time: 01/26/21  6:27 AM  Result Value Ref Range   Hgb A1c MFr Bld 5.1 4.8 - 5.6 %    Comment: (NOTE) Pre diabetes:          5.7%-6.4%  Diabetes:              >6.4%  Glycemic control for   <7.0% adults with diabetes    Mean Plasma Glucose 99.67 mg/dL    Comment: Performed at [facility], [address] [place], [place] [address]  CBC     Status: Abnormal   Collection Time: 01/26/21  6:27 AM  Result Value Ref Range   WBC 4.8 4.0 - 10.5 K/uL   RBC 3.79 (L) 3.87 - 5.11 MIL/uL   Hemoglobin 12.7 12.0 - 15.0 g/dL   HCT 13.0 86.5 - 78.4 %   MCV 101.3 (H) 80.0 - 100.0 fL   MCH 33.5 26.0 - 34.0 pg   MCHC 33.1 30.0 - 36.0 g/dL  RDW 14.7 11.5 - 15.5 %   Platelets 304 150 - 400 K/uL   nRBC 0.0 0.0 - 0.2 %    Comment: Performed at [facility], [address], [place], [place] [address]  Comprehensive metabolic panel     Status: Abnormal   Collection Time: 01/26/21  6:27 AM  Result Value Ref Range   Sodium 137 135 - 145 mmol/L   Potassium 4.0 3.5 - 5.1 mmol/L   Chloride 101 98 - 111 mmol/L   CO2 24 22 - 32 mmol/L   Glucose, Bld 110 (H) 70 - 99 mg/dL    Comment: Glucose reference range applies only to samples taken after fasting for at least 8 hours.   BUN 28 (H) 8 - 23 mg/dL   Creatinine, Ser 0.53 (H) 0.44 - 1.00 mg/dL   Calcium 9.1 8.9 - 97.6 mg/dL   Total Protein 6.7 6.5 - 8.1 g/dL   Albumin 4.4 3.5 - 5.0 g/dL   AST 23 15 - 41 U/L   ALT 19 0 - 44 U/L   Alkaline Phosphatase 59 38 - 126 U/L   Total Bilirubin 1.1 0.3 - 1.2  mg/dL   GFR, Estimated 34 (L) >60 mL/min    Comment: (NOTE) Calculated using the CKD-EPI Creatinine Equation (2021)    Anion gap 12 5 - 15    Comment: Performed at [facility], [address] [place], [place] [address]  Ethanol     Status: None   Collection Time: 01/26/21  6:27 AM  Result Value Ref Range   Alcohol, Ethyl (B) <10 <10 mg/dL    Comment: (NOTE) Lowest detectable limit for serum alcohol is 10 mg/dL.  For medical purposes only. Performed at [facility], [address] [place], [place] [address]   Lipid panel     Status: None   Collection Time: 01/26/21  6:27 AM  Result Value Ref Range   Cholesterol 196 0 - 200 mg/dL   Triglycerides 409 <735 mg/dL   HDL 72 >32 mg/dL   Total CHOL/HDL Ratio 2.7 RATIO   VLDL 25 0 - 40 mg/dL   LDL Cholesterol 99 0 - 99 mg/dL    Comment:        Total Cholesterol/HDL:CHD Risk Coronary Heart Disease Risk Table                     Men   Women  1/2 Average Risk   3.4   3.3  Average Risk       5.0   4.4  2 X Average Risk   9.6   7.1  3 X Average Risk  23.4   11.0        Use the calculated Patient Ratio above and the CHD Risk Table to determine the patient's CHD Risk.        ATP III CLASSIFICATION (LDL):  <100     mg/dL   Optimal  992-426  mg/dL   Near or Above                    Optimal  130-159  mg/dL   Borderline  834-196  mg/dL   High  >222     mg/dL   Very High Performed at [facility], [address] [place], [place] [address]   Urinalysis, Routine w reflex microscopic Urine, Clean Catch     Status: Abnormal   Collection Time: 01/26/21  3:03 PM  Result Value Ref Range   Color, Urine YELLOW YELLOW   APPearance HAZY (A) CLEAR   Specific Gravity, Urine 1.014 1.005 - 1.030   pH 5.0 5.0 - 8.0   Glucose, UA NEGATIVE NEGATIVE mg/dL   Hgb urine dipstick NEGATIVE NEGATIVE   Bilirubin Urine NEGATIVE NEGATIVE   Ketones, ur 5 (A) NEGATIVE mg/dL   Protein, ur NEGATIVE  NEGATIVE mg/dL   Nitrite NEGATIVE NEGATIVE   Leukocytes,Ua NEGATIVE NEGATIVE    Comment: Performed at [facility], [address], [place], [place] [address]  Urine rapid drug screen (hosp performed)     Status: Abnormal   Collection Time: 01/26/21  3:03 PM  Result Value Ref Range   Opiates NONE DETECTED NONE DETECTED   Cocaine NONE DETECTED NONE DETECTED   Benzodiazepines POSITIVE (A) NONE DETECTED   Amphetamines NONE DETECTED NONE DETECTED   Tetrahydrocannabinol NONE DETECTED NONE DETECTED   Barbiturates NONE DETECTED NONE DETECTED    Comment: (NOTE) DRUG SCREEN FOR MEDICAL PURPOSES ONLY.  IF CONFIRMATION IS NEEDED FOR ANY PURPOSE, NOTIFY LAB WITHIN 5 DAYS.  LOWEST DETECTABLE LIMITS FOR URINE DRUG SCREEN Drug Class                     Cutoff (ng/mL) Amphetamine and metabolites    1000 Barbiturate and metabolites    200 Benzodiazepine                 200 Tricyclics and metabolites     300 Opiates and metabolites        300 Cocaine and metabolites        300 THC                            50 Performed at [facility], [address], [place], [place] [address]   Vitamin B12     Status: None   Collection Time: 01/26/21  6:38 PM  Result Value Ref Range   Vitamin B-12 718 180 - 914 pg/mL    Comment: (NOTE) This assay is not validated for testing neonatal or myeloproliferative syndrome specimens for Vitamin B12 levels. Performed at [facility], [address], [place], [place] [address]   Folate, serum, performed at [facility] lab     Status: Abnormal   Collection Time: 01/26/21  6:38 PM  Result Value Ref Range   Folate 5.1 (L) >5.9 ng/mL    Comment: Performed at [facility], [address] [place], [place] [address]   Blood Alcohol level:  Lab Results  Component Value Date   ETH <10 01/26/2021   Metabolic Disorder Labs: Lab Results  Component Value Date   HGBA1C 5.1 01/26/2021   MPG  99.67 01/26/2021   No results found for: PROLACTIN Lab Results  Component Value Date   CHOL 196 01/26/2021   TRIG 127 01/26/2021   HDL 72 01/26/2021   CHOLHDL 2.7 01/26/2021   VLDL 25 01/26/2021   LDLCALC 99 01/26/2021   Physical Findings: AIMS:  , ,  ,  ,    CIWA:  CIWA-Ar Total: 4 COWS:     Musculoskeletal: Strength & Muscle Tone: within normal limits Gait & Station: normal Patient leans: N/A  Psychiatric Specialty Exam:  Presentation  General Appearance: Casual; Fairly Groomed  Eye Contact:Good  Speech:Clear and Coherent; Normal Rate  Speech Volume:Normal  Handedness:Right  Mood and Affect  Mood:Anxious; Depressed  Affect:Congruent  Thought  Process  Thought Processes:Coherent  Descriptions of Associations:Intact  Orientation:Full (Time, Place and Person)  Thought Content:Logical  History of Schizophrenia/Schizoaffective disorder:No  Duration of Psychotic Symptoms:No data recorded Hallucinations:Hallucinations: Auditory Description of Auditory Hallucinations: "I hear music playing all the time".  Ideas of Reference:None  Suicidal Thoughts:Suicidal Thoughts: No SI Passive Intent and/or Plan: Without Intent; Without Plan; Without Means to Carry Out; Without Access to Means  Homicidal Thoughts:Homicidal Thoughts: No  Sensorium  Memory:Immediate Good; Recent Good; Remote Good  Judgment:Fair  Insight:Present; Good  Executive Functions  Concentration:Good  Attention Span:Good  Recall:Good  Fund of Knowledge:Fair  Language:Good  Psychomotor Activity  Psychomotor Activity:Psychomotor Activity: Normal  Assets  Assets:Communication Skills; Desire for Improvement; Financial Resources/Insurance; Housing; Resilience; Social Support  Sleep  Sleep:Sleep: Good Number of Hours of Sleep: 6  Physical Exam: Physical Exam Vitals and nursing note reviewed.  HENT:     Head: Normocephalic.     Nose: Nose normal.     Mouth/Throat:     Pharynx:  Oropharynx is clear.  Eyes:     Pupils: Pupils are equal, round, and reactive to light.  Cardiovascular:     Rate and Rhythm: Normal rate.     Pulses: Normal pulses.  Pulmonary:     Effort: Pulmonary effort is normal.  Genitourinary:    Comments: Deferred Musculoskeletal:        General: Normal range of motion.     Cervical back: Normal range of motion.  Skin:    General: Skin is warm and dry.  Neurological:     General: No focal deficit present.     Mental Status: She is alert and oriented to person, place, and time.   Review of Systems  Constitutional:  Negative for chills, diaphoresis and fever.  HENT:  Negative for congestion and sore throat.   Eyes:  Negative for blurred vision.  Respiratory:  Negative for cough, shortness of breath and wheezing.   Cardiovascular:  Negative for chest pain and palpitations.  Gastrointestinal:  Negative for abdominal pain, blood in stool, constipation, diarrhea, heartburn, melena, nausea and vomiting.  Genitourinary:  Negative for dysuria.  Musculoskeletal:  Negative for joint pain and myalgias.  Neurological:  Negative for dizziness, tingling, tremors, sensory change, speech change, focal weakness, seizures, loss of consciousness, weakness and headaches.  Endo/Heme/Allergies:  Negative for environmental allergies and polydipsia. Does not bruise/bleed easily.       Allergies: Cefaclor                  Cephalosporins           Psychiatric/Behavioral:  Positive for depression and substance abuse (Hx. alcoholism (chronic)). Negative for hallucinations, memory loss and suicidal ideas. The patient is nervous/anxious. The patient does not have insomnia.   Blood pressure 137/65, pulse 83, temperature 98.4 F (36.9 C), temperature source Oral, resp. rate 20, height 5' 0.63" (1.54 m), weight 66.7 kg, SpO2 100 %. Body mass index is 28.12 kg/m.  Treatment Plan Summary: Daily contact with patient to assess and evaluate symptoms and progress in  treatment and Medication management.   Continue inpatient hospitalization.  Will continue today 01/27/2021 plan as below except where it is noted.    PLAN: Safety and Monitoring:             -- Voluntary admission to inpatient psychiatric unit for safety, stabilization and treatment             -- Daily contact with patient to assess and evaluate symptoms and progress  in treatment             -- Patient's case to be discussed in multi-disciplinary team meeting             -- Observation Level : q15 minute checks             -- Vital signs:  q12 hours             -- Precautions: suicide, elopement, and assault   2. Psychiatric Diagnoses and Treatment:               -Discontinue Wellbutrin, due to ineffectiveness, and patient's eating disorder symptoms and patient's increased risk of seizures during alcohol withdrawal. -Continue Zoloft 25 mg once daily today.  Titrate during hospitalization.  For mood, anxiety, and PTSD.  We will use liquid Zoloft, as the patient's rouzeny and reversal, make that breakdown and switching of tablets unpredictable, which makes enough medication that can be absorbed and predictable. -Decreased trazodone from 50 mg to Trazodone 25 mg per as needed episode last night. -Continue CIWA   --  The risks/benefits/side-effects/alternatives to this medication were discussed in detail with the patient and time was given for questions. The patient consents to medication trial.                -- Metabolic profile and EKG monitoring obtained while on an atypical antipsychotic (BMI: Lipid Panel: HbgA1c: QTc:)              -- Encouraged patient to participate in unit milieu and in scheduled group therapies              -- Short Term Goals: Ability to identify changes in lifestyle to reduce recurrence of condition will improve, Ability to verbalize feelings will improve, Ability to disclose and discuss suicidal ideas, Ability to demonstrate self-control will improve, Ability to  identify and develop effective coping behaviors will improve, Ability to maintain clinical measurements within normal limits will improve, Compliance with prescribed medications will improve, and Ability to identify triggers associated with substance abuse/mental health issues will improve             -- Long Term Goals: Improvement in symptoms so as ready for discharge                            3. Medical Issues Being Addressed:              Alcohol use disorder.  CIWA   4. Discharge Planning:              -- Social work and case management to assist with discharge planning and identification of hospital follow-up needs prior to discharge             -- Estimated LOS: 5-7 days             -- Discharge Concerns: Need to establish a safety plan; Medication compliance and effectiveness             -- Discharge Goals: Return home with outpatient referrals for mental health follow-up including medication management/psychotherapy  Armandina Stammer, NP, PMHNP, FNP-BC 01/27/2021, 3:41 PM

## 2021-01-27 NOTE — Group Note (Signed)
LCSW Group Therapy Note  No therapy group could be held this day due to staffing issues.  Another licensed group was held.  Kashari Chalmers Grossman-Orr, LCSW 01/27/2021 11:40 AM     

## 2021-01-27 NOTE — [facility] Group Notes (Signed)
.  Psychoeducational Group Note  Date: 01/27/2021 Time: 0900-1000    Goal Setting   Purpose of Group: This group helps to provide patients with the steps of setting Mueller goal that is specific, measurable, attainable, realistic and time specific. Mueller discussion on how we keep ourselves stuck with negative self talk.    Participation Level:  Did not attend   Tiffany Mueller 

## 2021-01-27 NOTE — [facility] Group Notes (Signed)
.  Psychoeducational Group Note    Date:01/27/2021 Time: 1300-1400    Purpose of Group: . The group focus' on teaching patients on how to identify their needs and how Life Skills:  A group where two lists are made. What people need and what are things that we do that are healthy. The lists are developed by the patients and it is explained that we often do the actions that are not healthy to get our list of needs met.  to develop the coping skills needed to get their needs met  Participation Level:  Did not attend   Paulino Rily

## 2021-01-27 NOTE — Progress Notes (Signed)
   01/27/21 2300  Psych Admission Type (Psych Patients Only)  Admission Status Voluntary  Psychosocial Assessment  Patient Complaints Depression  Eye Contact Fair  Facial Expression Anxious;Sad  Affect Appropriate to circumstance  Speech Logical/coherent  Interaction Assertive  Motor Activity Fidgety  Appearance/Hygiene Unremarkable  Thought Process  Coherency WDL  Content WDL  Delusions None reported or observed  Perception Hallucinations  Hallucination Auditory (music)  Judgment Impaired  Confusion None  Danger to Self  Current suicidal ideation? Denies  Danger to Others  Danger to Others None reported or observed

## 2021-01-27 NOTE — Progress Notes (Signed)
Pt did not attend Orientation group. 

## 2021-01-28 MED ORDER — TRAZODONE HCL 50 MG PO TABS
50.0000 mg | ORAL_TABLET | Freq: Every evening | ORAL | Status: DC | PRN
  Administered 2021-01-28 – 2021-01-31 (×4): 50 mg via ORAL
  Filled 2021-01-28 (×4): qty 1

## 2021-01-28 MED ORDER — NALTREXONE HCL 50 MG PO TABS
50.0000 mg | ORAL_TABLET | Freq: Every day | ORAL | Status: DC
  Administered 2021-01-28 – 2021-02-06 (×10): 50 mg via ORAL
  Filled 2021-01-28: qty 1
  Filled 2021-01-28: qty 7
  Filled 2021-01-28 (×12): qty 1

## 2021-01-28 MED ORDER — TRAZODONE HCL 50 MG PO TABS
50.0000 mg | ORAL_TABLET | Freq: Once | ORAL | Status: AC
  Administered 2021-01-28: 50 mg via ORAL
  Filled 2021-01-28 (×2): qty 1
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
## 2021-01-28 NOTE — [facility] Group Notes (Signed)
Adult Psychoeducational Group Not Date:  01/28/2021 Time:  0900-1045 Group Topic/Focus: PROGRESSIVE RELAXATION. Mueller group where deep breathing is taught and tensing and relaxation muscle groups is used. Imagery is used as well.  Pts are asked to imagine 3 pillars that hold them up when they are not able to hold themselves up.  Participation Level:  Did not attend   Tiffany Mueller  

## 2021-01-28 NOTE — [facility] Group Notes (Signed)
Psychoeducational Group Note  Date: 01-28-21 Time:  1300  Group Topic/Focus:  Making Healthy Choices:   The focus of this group is to help patients identify negative/unhealthy choices they were using prior to admission and identify positive/healthier coping strategies to replace them upon discharge.In this group, patients started asking about the brain and how the brain works with and how the chemicals work for those who use substances, the pros and cons of saboxone.  Participation Level:  Did not attend   Dione Housekeeper

## 2021-01-28 NOTE — [facility] Group Notes (Signed)
Patient did not attend morning group today. Patient was invited to attend.

## 2021-01-28 NOTE — Progress Notes (Signed)
[facility] MD Progress Note  01/28/2021 2:56 PM Tiffany Mueller  MRN:  [id number]  Subjective: Tiffany Mueller reports, "I'm not doing as well as I had hoped. I'm cold, shaky & afraid. I'm afraid of going back into the real world after discharge. I'm interested in going to a rehab program, but the one that is not a 12-step based program. That will not work for me.  I feel agitated, weepy & wobbly. I thought cutting down on the Trazodone last night will help me feeling less wobbly, but it did not. Instead, I tossed & turned all night long. I'm craving alcohol & cigarettes real bad. May be I should go back on taking the Naltrexone again".  Daily notes: Tiffany Mueller is seen, chart reviewed. The chart findings discussed with the treatment team. She presents alert, oriented & aware of situation. She is visible on the unit, attending group sessions. She is complaining of sleeplessness last night. Had asked yesterday to decrease her Trazodone dose in half to reduce excessive drowsiness in am. This was done. However, says did not sleep well last night. Wants to resume the original dose of the Trazodone 50 mg Q hs prn. Patient is now considering going to a rehabilitation treatment center after discharge to deal with her chronic alcoholism. Does not want the treatment program that is based on the 12-step. She says she had a good visit from her husband last night. She considers her husband a good support system. [name] is taking & tolerating her treatment regimen. Has adjusted the dose of the Trazodone back to 50 mg prn. She denies any SIHI, AVH, delusional thoughts or paranoia. Says has not heard any music today. She is waiting to speak with SW on Monday to determine the bast treatment option for her for her alcoholism. Patient made it clear does not want to go to [facility]. Says her friend works there. [name] is in agreement to continue current plan of care as already in progress. Vital signs stable. Resumed patient on Naltrexone 50  mg po Q daily for alcohol craving..  Objective: 65 year old female with past psychiatric history of major depressive disorder and alcohol use disorder was admitted to the psychiatric unit for safety and evaluation and treatment of worsening depression and suicidal thoughts.  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  Denies history of suicide attempt.  Principal Problem: Major depressive disorder, recurrent episode, severe (HCC)  Diagnosis: Principal Problem:   Major depressive disorder, recurrent episode, severe (HCC) Active Problems:   Alcohol use disorder, severe, dependence (HCC)  Total Time spent with patient:  25 minutes  Past Psychiatric History: Alcohol use disorder, Major depressive disorder.  Past Medical History:  Past Medical History:  Diagnosis Date   ADD (attention deficit disorder)    Anastomotic stricture of gastrojejunostomy 2002   EGD dilation   Anxiety    Arthritis    Carpal tunnel syndrome    Depression    Hypertension    Osteoporosis     Past Surgical History:  Procedure Laterality Date   ABDOMINAL HYSTERECTOMY  2007   APPENDECTOMY     CARPAL TUNNEL RELEASE Left 07/04/2015   CARPAL TUNNEL RELEASE Left 07/04/2015   Procedure: LEFT CARPAL TUNNEL RELEASE;  Surgeon: Dominica Severin, MD;  Location: [facility] OR;  Service: Orthopedics;  Laterality: Left;   ESOPHAGOGASTRODUODENOSCOPY (EGD) WITH ESOPHAGEAL DILATION  2002   Dr Randa Evens.  GJ stricture   FRACTURE SURGERY     GASTRIC BYPASS  2001  [facility]   LAPAROSCOPIC CHOLECYSTECTOMY     LAPAROSCOPIC OOPHERECTOMY     LIGAMENT REPAIR Right    "wrist"   OPEN REDUCTION INTERNAL FIXATION (ORIF) DISTAL RADIAL FRACTURE Left 07/04/2015   WITH ALLOGRAFT BONE GRAFTING    OPEN REDUCTION INTERNAL FIXATION (ORIF) DISTAL RADIAL FRACTURE Left 07/04/2015   Procedure: OPEN REDUCTION INTERNAL FIXATION (ORIF) LEFT DISTAL RADIAL FRACTURE WITH ALLOGRAFT BONE GRAFTING;  Surgeon: Dominica Severin, MD;  Location: [facility] OR;  Service: Orthopedics;  Laterality: Left;   STRABISMUS SURGERY Bilateral    "4 on the left; 1 on right"   TUBAL LIGATION     Family History:  Family History  Problem Relation Age of Onset   Alzheimer's disease Mother    Breast cancer Mother    Atrial fibrillation Father    Breast cancer Maternal Grandmother    Family Psychiatric  History: Bipolar disorder: Son.                                                  Alcoholism: Son. Social History:  Social History   Substance and Sexual Activity  Alcohol Use Yes   Alcohol/week: 28.0 standard drinks   Types: 7 Standard drinks or equivalent, 21 Glasses of wine per week   Comment: 2-3 glass wine nightly     Social History   Substance and Sexual Activity  Drug Use No    Social History   Socioeconomic History   Marital status: Married    Spouse name: Not on file   Number of children: Not on file   Years of education: Not on file   Highest education level: Not on file  Occupational History   Not on file  Tobacco Use   Smoking status: Every Day    Packs/day: 0.50    Years: 45.00    Pack years: 22.50    Types: Cigarettes   Smokeless tobacco: Never  Substance and Sexual Activity   Alcohol use: Yes    Alcohol/week: 28.0 standard drinks    Types: 7 Standard drinks or equivalent, 21 Glasses of wine per week    Comment: 2-3 glass wine nightly   Drug use: No   Sexual activity: Yes  Other Topics Concern   Not on file  Social History Narrative   Not on file   Social Determinants of Health   Financial Resource Strain: Not on file  Food Insecurity: Not on file  Transportation Needs: Not on file  Physical Activity: Not on file  Stress: Not on file  Social Connections: Not on file   Additional Social History:    Pain Medications: see MAR Prescriptions: See MAR Over the Counter: see MAR History of alcohol / drug use?: Yes Longest period of sobriety (when/how long): none reported Negative  Consequences of Use: Personal relationships Withdrawal Symptoms: Other (Comment) (anxiety) Name of Substance 1: alcohol 1 - Age of First Use: unknown 1 - Amount (size/oz): 2 bottles wine 1 - Frequency: daily 1 - Duration: on-going 1 - Last Use / Amount: drank a bottle of wine and 4 mixed driks yesterday 1 - Method of Aquiring: store 1- Route of Use: oral  Sleep: Good  Appetite:  Good  Current Medications: Current Facility-Administered Medications  Medication Dose Route Frequency Provider Last Rate Last Admin   acetaminophen (TYLENOL) tablet 650 mg  650 mg Oral Q6H PRN  Armandina Stammer I, NP       alum & mag hydroxide-simeth (MAALOX/MYLANTA) 200-200-20 MG/5ML suspension 30 mL  30 mL Oral Q4H PRN Leeroy Lovings I, NP       fluticasone (FLONASE) 50 MCG/ACT nasal spray 2 spray  2 spray Each Nare Daily PRN Armandina Stammer I, NP       furosemide (LASIX) tablet 40 mg  40 mg Oral Daily Armandina Stammer I, NP   40 mg at 01/28/21 0813   hydrOXYzine (ATARAX/VISTARIL) tablet 25 mg  25 mg Oral Q6H PRN Armandina Stammer I, NP       loperamide (IMODIUM) capsule 2-4 mg  2-4 mg Oral PRN Armandina Stammer I, NP       LORazepam (ATIVAN) tablet 1 mg  1 mg Oral Q6H PRN Armandina Stammer I, NP       LORazepam (ATIVAN) tablet 1 mg  1 mg Oral BID Armandina Stammer I, NP   1 mg at 01/28/21 1610   Followed by   Melene Muller ON 01/29/2021] LORazepam (ATIVAN) tablet 1 mg  1 mg Oral Daily Daiwik Buffalo I, NP       losartan (COZAAR) tablet 50 mg  50 mg Oral Daily Armandina Stammer I, NP   50 mg at 01/28/21 9604   magnesium hydroxide (MILK OF MAGNESIA) suspension 30 mL  30 mL Oral Daily PRN Armandina Stammer I, NP       multivitamin with minerals tablet 1 tablet  1 tablet Oral Daily Armandina Stammer I, NP   1 tablet at 01/28/21 5409   nicotine (NICODERM CQ - dosed in mg/24 hours) patch 21 mg  21 mg Transdermal Q0600 Armandina Stammer I, NP   21 mg at 01/28/21 0736   ondansetron (ZOFRAN-ODT) disintegrating tablet 4 mg  4 mg Oral Q6H PRN Armandina Stammer I, NP       sertraline  (ZOLOFT) 20 MG/ML concentrated solution 50 mg  50 mg Oral Daily Armandina Stammer I, NP   50 mg at 01/28/21 8119   thiamine (B-1) injection 100 mg  100 mg Intramuscular Once Armandina Stammer I, NP       thiamine tablet 100 mg  100 mg Oral Daily Armandina Stammer I, NP   100 mg at 01/28/21 1478   traZODone (DESYREL) tablet 50 mg  50 mg Oral QHS PRN Armandina Stammer I, NP       Lab Results:  Results for orders placed or performed during the hospital encounter of 01/25/21 (from the past 48 hour(s))  Urinalysis, Routine w reflex microscopic Urine, Clean Catch     Status: Abnormal   Collection Time: 01/26/21  3:03 PM  Result Value Ref Range   Color, Urine YELLOW YELLOW   APPearance HAZY (A) CLEAR   Specific Gravity, Urine 1.014 1.005 - 1.030   pH 5.0 5.0 - 8.0   Glucose, UA NEGATIVE NEGATIVE mg/dL   Hgb urine dipstick NEGATIVE NEGATIVE   Bilirubin Urine NEGATIVE NEGATIVE   Ketones, ur 5 (A) NEGATIVE mg/dL   Protein, ur NEGATIVE NEGATIVE mg/dL   Nitrite NEGATIVE NEGATIVE   Leukocytes,Ua NEGATIVE NEGATIVE    Comment: Performed at [facility], [address] [place], [place] [address]  Urine rapid drug screen (hosp performed)     Status: Abnormal   Collection Time: 01/26/21  3:03 PM  Result Value Ref Range   Opiates NONE DETECTED NONE DETECTED   Cocaine NONE DETECTED NONE DETECTED   Benzodiazepines POSITIVE (A) NONE DETECTED   Amphetamines NONE DETECTED NONE DETECTED  Tetrahydrocannabinol NONE DETECTED NONE DETECTED   Barbiturates NONE DETECTED NONE DETECTED    Comment: (NOTE) DRUG SCREEN FOR MEDICAL PURPOSES ONLY.  IF CONFIRMATION IS NEEDED FOR ANY PURPOSE, NOTIFY LAB WITHIN 5 DAYS.  LOWEST DETECTABLE LIMITS FOR URINE DRUG SCREEN Drug Class                     Cutoff (ng/mL) Amphetamine and metabolites    1000 Barbiturate and metabolites    200 Benzodiazepine                 200 Tricyclics and metabolites     300 Opiates and metabolites        300 Cocaine and metabolites         300 THC                            50 Performed at [facility], [address] [place], [place] [address]   Vitamin B12     Status: None   Collection Time: 01/26/21  6:38 PM  Result Value Ref Range   Vitamin B-12 718 180 - 914 pg/mL    Comment: (NOTE) This assay is not validated for testing neonatal or myeloproliferative syndrome specimens for Vitamin B12 levels. Performed at [facility], [address], [place], [place] [address]   Folate, serum, performed at [facility] lab     Status: Abnormal   Collection Time: 01/26/21  6:38 PM  Result Value Ref Range   Folate 5.1 (L) >5.9 ng/mL    Comment: Performed at [facility], [address] [place], [place] [address]   Blood Alcohol level:  Lab Results  Component Value Date   ETH <10 01/26/2021   Metabolic Disorder Labs: Lab Results  Component Value Date   HGBA1C 5.1 01/26/2021   MPG 99.67 01/26/2021   No results found for: PROLACTIN Lab Results  Component Value Date   CHOL 196 01/26/2021   TRIG 127 01/26/2021   HDL 72 01/26/2021   CHOLHDL 2.7 01/26/2021   VLDL 25 01/26/2021   LDLCALC 99 01/26/2021   Physical Findings: AIMS:  , ,  ,  ,    CIWA:  CIWA-Ar Total: 2 COWS:     Musculoskeletal: Strength & Muscle Tone: within normal limits Gait & Station: normal Patient leans: N/A  Psychiatric Specialty Exam:  Presentation  General Appearance: Casual; Fairly Groomed  Eye Contact:Good  Speech:Clear and Coherent; Normal Rate  Speech Volume:Normal  Handedness:Right  Mood and Affect  Mood:Anxious; Depressed  Affect:Congruent  Thought Process  Thought Processes:Coherent  Descriptions of Associations:Intact  Orientation:Full (Time, Place and Person)  Thought Content:Logical  History of Schizophrenia/Schizoaffective disorder:No  Duration of Psychotic Symptoms:No data recorded Hallucinations:Hallucinations: None Description of Auditory  Hallucinations: "I have not heard any music today so far".  Ideas of Reference:None  Suicidal Thoughts:Suicidal Thoughts: No SI Passive Intent and/or Plan: Without Intent; Without Plan; Without Means to Carry Out; Without Access to Means  Homicidal Thoughts:Homicidal Thoughts: No  Sensorium  Memory:Immediate Good; Recent Good; Remote Good  Judgment:Fair  Insight:Present; Good  Executive Functions  Concentration:Good  Attention Span:Good  Recall:Good  Fund of Knowledge:Fair  Language:Good  Psychomotor Activity  Psychomotor Activity:Psychomotor Activity: Normal  Assets  Assets:Communication Skills; Desire for Improvement; Financial Resources/Insurance; Housing; Resilience; Social Support  Sleep  Sleep:Sleep: Fair Number of Hours of Sleep: 5.5  Physical Exam: Physical Exam Vitals and nursing note reviewed.  HENT:     Head: Normocephalic.     Nose: Nose normal.     Mouth/Throat:     Pharynx: Oropharynx is clear.  Eyes:     Pupils: Pupils are equal, round, and reactive to light.  Cardiovascular:     Rate and Rhythm: Normal rate.     Pulses: Normal pulses.  Pulmonary:     Effort: Pulmonary effort is normal.  Genitourinary:    Comments: Deferred Musculoskeletal:        General: Normal range of motion.     Cervical back: Normal range of motion.  Skin:    General: Skin is warm and dry.  Neurological:     General: No focal deficit present.     Mental Status: She is alert and oriented to person, place, and time.   Review of Systems  Constitutional:  Negative for chills, diaphoresis and fever.  HENT:  Negative for congestion and sore throat.   Eyes:  Negative for blurred vision.  Respiratory:  Negative for cough, shortness of breath and wheezing.   Cardiovascular:  Negative for chest pain and palpitations.  Gastrointestinal:  Negative for abdominal pain, blood in stool, constipation, diarrhea, heartburn, melena, nausea and vomiting.  Genitourinary:  Negative  for dysuria.  Musculoskeletal:  Negative for joint pain and myalgias.  Neurological:  Negative for dizziness, tingling, tremors, sensory change, speech change, focal weakness, seizures, loss of consciousness, weakness and headaches.  Endo/Heme/Allergies:  Negative for environmental allergies and polydipsia. Does not bruise/bleed easily.       Allergies: Cefaclor                  Cephalosporins           Psychiatric/Behavioral:  Positive for depression and substance abuse (Hx. alcoholism (chronic)). Negative for hallucinations, memory loss and suicidal ideas. The patient is nervous/anxious. The patient does not have insomnia.   Blood pressure 129/66, pulse 69, temperature 97.7 F (36.5 C), temperature source Oral, resp. rate 20, height 5' 0.63" (1.54 m), weight 66.7 kg, SpO2 100 %. Body mass index is 28.12 kg/m.  Treatment Plan Summary: Daily contact with patient to assess and evaluate symptoms and progress in treatment and Medication management.   Continue inpatient hospitalization.  Will continue today 01/28/2021 plan as below except where it is noted.    PLAN: Safety and Monitoring:             -- Voluntary admission to inpatient psychiatric unit for safety, stabilization and treatment             -- Daily contact with patient to assess and evaluate symptoms and progress in treatment             -- Patient's case to be discussed in multi-disciplinary team meeting             -- Observation Level : q15 minute checks             -- Vital signs:  q12 hours             -- Precautions: suicide, elopement, and assault   2. Psychiatric Diagnoses and Treatment:    Alcohol withdrawal symptoms. Continue the CIWA detox protocols as already in progress/as recommended.   Alcohol craving. Resumed Naltrexone 50 mg po daily (starting tomorrow).   Major depressive disorder. Sertraline 25 mg (20 mg/ML conc concentrated solution) po once (Completed). Continue Sertraline 50 mg (20 mg/ML conc  concentrated solution) po daily start (01-27-21).   Anxiety.  Continue  Hydroxyzine 25 mg po po Q 6 hrs prn.   Insomnia.  Continue Trazodone 50 mg po Q hs prn.   Other prn medications, continue:  Acetaminophen 650 mg po Q 6 hrs prn for pain/fever. Mylanta 30 mg po Q 4 hrs prn for indigestion.  Imodium 2-4 mg po prn for loose stools x 72 hrs. MOM 30 ml po daily prn for constipation.  Zofran-ODT 4 mg po Q 6 hrs prn for N/V.   Other medical issues (resumed): Flonase 2 sprays each nare for allergies. Furosemide 40 mg po daily for HTN/edema. Losartan 50 mg po daily for HTN. Multivitamin 1 tablet po daily for low vitamin. Nicotine patch 21 mg trans-dermally Q 24 hrs for nicotine withdrawal symptoms. Thiamine 100 mg po or IM daily for low thiamine.    3. Discharge Planning:              -- Social work and case management to assist with discharge planning and identification of hospital follow-up needs prior to discharge             -- Estimated LOS: 5-7 days             -- Discharge Concerns: Need to establish a safety plan; Medication compliance and effectiveness             -- Discharge Goals: Interested in going to a rehab program & will need outpatient referrals for mental health follow-up including medication management/psychotherapy  Armandina Stammer, NP, PMHNP, FNP-BC 01/28/2021, 2:56 PM Patient ID: [name], female   DOB: 1956-01-23, 65 y.o.   MRN: [id number]

## 2021-01-28 NOTE — Progress Notes (Signed)
   01/28/21 2348  Psych Admission Type (Psych Patients Only)  Admission Status Voluntary  Psychosocial Assessment  Patient Complaints Anxiety  Eye Contact Fair  Facial Expression Flat  Affect Appropriate to circumstance  Speech Logical/coherent  Interaction Assertive  Motor Activity Other (Comment) (WDL)  Appearance/Hygiene Unremarkable  Behavior Characteristics Appropriate to situation  Mood Anxious;Pleasant  Thought Process  Coherency WDL  Content WDL  Delusions None reported or observed  Perception WDL  Hallucination None reported or observed  Judgment Impaired  Confusion None  Danger to Self  Current suicidal ideation? Denies  Danger to Others  Danger to Others None reported or observed

## 2021-01-29 DIAGNOSIS — F332 Major depressive disorder, recurrent severe without psychotic features: Secondary | ICD-10-CM | POA: Diagnosis not present

## 2021-01-29 MED ORDER — SERTRALINE HCL 50 MG PO TABS
50.0000 mg | ORAL_TABLET | Freq: Every day | ORAL | Status: DC
  Administered 2021-01-30 – 2021-01-31 (×2): 50 mg via ORAL
  Filled 2021-01-29 (×4): qty 1

## 2021-01-29 MED ORDER — HYDROXYZINE HCL 25 MG PO TABS
25.0000 mg | ORAL_TABLET | Freq: Three times a day (TID) | ORAL | Status: DC | PRN
  Administered 2021-01-29 – 2021-02-05 (×9): 25 mg via ORAL
  Filled 2021-01-29 (×8): qty 1

## 2021-01-29 MED ORDER — ARIPIPRAZOLE 5 MG PO TABS
5.0000 mg | ORAL_TABLET | Freq: Every day | ORAL | Status: DC
  Administered 2021-01-29: 5 mg via ORAL
  Filled 2021-01-29 (×4): qty 1

## 2021-01-29 NOTE — [facility] Group Notes (Signed)
Patient was given material for group today 

## 2021-01-29 NOTE — Group Note (Signed)
Recreation Therapy Group Note   Group Topic:Stress Management  Group Date: 01/29/2021 Start Time: 0935 End Time: 1050 Facilitators: Caroll Rancher, LRT/CTRS Location: [address] Dayroom  Goal Area(s) Addresses:  Patient will identify positive stress management techniques. Patient will identify benefits of using stress management post d/c.  Group Description:  Meditation.  LRT played a meditation that focused on having patience when dealing with situations beyond our control.  Patients were to sit and focus as the meditation played to fully engage.   Affect/Mood: N/A   Participation Level: Did not attend     Clinical Observations/Individualized Feedback: Pt did not attend group.    Plan: Continue to engage patient in RT group sessions 2-3x/week.   Caroll Rancher, LRT/CTRS 01/29/2021 1:40 PM

## 2021-01-29 NOTE — [facility] Group Notes (Signed)
Patient did not attend group.    Spiritual care group on grief and loss facilitated by chaplain Katy Rusell Meneely, BCC   Group Goal:   Support / Education around grief and loss   Members engage in facilitated group support and psycho-social education.   Group Description:   Following introductions and group rules, group members engaged in facilitated group dialog and support around topic of loss, with particular support around experiences of loss in their lives. Group Identified types of loss (relationships / self / things) and identified patterns, circumstances, and changes that precipitate losses. Reflected on thoughts / feelings around loss, normalized grief responses, and recognized variety in grief experience. Group noted Worden's four tasks of grief in discussion.   Group drew on Adlerian / Rogerian, narrative, MI,    

## 2021-01-29 NOTE — Progress Notes (Addendum)
[facility] MD Progress Note  01/29/2021 1:26 PM Tiffany Mueller  MRN:  [id number]  Subjective: Tiffany Mueller reports, "I'm not doing as well as I thought I would be" I want to stay here until I can go to a rehab for my alcoholism. I do not want a 12 step program though." .  Evaluation on the unit today, 10/24: Tiffany Mueller is seen, chart reviewed and case discussed with the treatment team.  She presents alert, oriented & aware of situation. She is visible on the unit, attending group sessions. She stated she did not sleep well last night again. Trazodone was modified and she can have 50 mg with a repeat dose of 50 mg if needed. She stated she has some anxiety and depression but mainly  she is here to detox and stated she will stay as long as she can until a rehab bed opens up. We discussed the high probability that an inpatient rehab bed may not be found prior to discharge and she will have to call daily for bed status.She mentioned [facility] in [place] but stated she has not called there yet. She stated does not want a 12 step program. She stated "those programs all talk about  is God." Patient made it clear does not want to go to [facility], her friend works there. She is nervous about going to rehab and then relapsing because her husband drinks. She stated her son is supposed to be cleaning the liquor out of the house before she gets there. She stated they have a cruise planned in December and she is worried about relapsing on the cruise.  She stated her husband visited last night and he was drunk. She then stated she considers her husband a good support system. Tiffany Mueller is taking & tolerating her treatment regimen.  She is taking her Zoloft, we will add Abilify 5 mg to augment her antidepressant.  She denies any SIHI, AVH, delusional thoughts or paranoia. She stated she hears music sometime but has not heard any today.  She is taking her medications as prescribed and has no issues with them. She appears to be anxious and  depressed but she did not rate either.  She has some difficulty remembering her medications, I reviewed them twice with her. Vital signs stable.   Objective: 65 year old female with past psychiatric history of major depressive disorder and alcohol use disorder was admitted to the psychiatric unit for safety and evaluation and treatment of worsening depression and suicidal thoughts.  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  Denies history of suicide attempt.  Principal Problem: Major depressive disorder, recurrent episode, severe (HCC)  Diagnosis: Principal Problem:   Major depressive disorder, recurrent episode, severe (HCC) Active Problems:   Alcohol use disorder, severe, dependence (HCC)  Total Time spent with patient:  25 minutes  Past Psychiatric History: Alcohol use disorder, Major depressive disorder.  Past Medical History:  Past Medical History:  Diagnosis Date   ADD (attention deficit disorder)    Anastomotic stricture of gastrojejunostomy 2002   EGD dilation   Anxiety    Arthritis    Carpal tunnel syndrome    Depression    Hypertension    Osteoporosis     Past Surgical History:  Procedure Laterality Date   ABDOMINAL HYSTERECTOMY  2007   APPENDECTOMY     CARPAL TUNNEL RELEASE Left 07/04/2015   CARPAL TUNNEL RELEASE Left 07/04/2015   Procedure: LEFT CARPAL TUNNEL RELEASE;  Surgeon: Dominica Severin, MD;  Location: [facility] OR;  Service: Orthopedics;  Laterality: Left;   ESOPHAGOGASTRODUODENOSCOPY (EGD) WITH ESOPHAGEAL DILATION  2002   Dr Randa Evens.  GJ stricture   FRACTURE SURGERY     GASTRIC BYPASS  2001   [facility]   LAPAROSCOPIC CHOLECYSTECTOMY     LAPAROSCOPIC OOPHERECTOMY     LIGAMENT REPAIR Right    "wrist"   OPEN REDUCTION INTERNAL FIXATION (ORIF) DISTAL RADIAL FRACTURE Left 07/04/2015   WITH ALLOGRAFT BONE GRAFTING    OPEN REDUCTION INTERNAL FIXATION (ORIF) DISTAL RADIAL FRACTURE Left 07/04/2015   Procedure: OPEN  REDUCTION INTERNAL FIXATION (ORIF) LEFT DISTAL RADIAL FRACTURE WITH ALLOGRAFT BONE GRAFTING;  Surgeon: Dominica Severin, MD;  Location: [facility] OR;  Service: Orthopedics;  Laterality: Left;   STRABISMUS SURGERY Bilateral    "4 on the left; 1 on right"   TUBAL LIGATION     Family History:  Family History  Problem Relation Age of Onset   Alzheimer's disease Mother    Breast cancer Mother    Atrial fibrillation Father    Breast cancer Maternal Grandmother    Family Psychiatric  History: Bipolar disorder: Son.                                                  Alcoholism: Son. Social History:  Social History   Substance and Sexual Activity  Alcohol Use Yes   Alcohol/week: 28.0 standard drinks   Types: 7 Standard drinks or equivalent, 21 Glasses of wine per week   Comment: 2-3 glass wine nightly     Social History   Substance and Sexual Activity  Drug Use No    Social History   Socioeconomic History   Marital status: Married    Spouse name: Not on file   Number of children: Not on file   Years of education: Not on file   Highest education level: Not on file  Occupational History   Not on file  Tobacco Use   Smoking status: Every Day    Packs/day: 0.50    Years: 45.00    Pack years: 22.50    Types: Cigarettes   Smokeless tobacco: Never  Substance and Sexual Activity   Alcohol use: Yes    Alcohol/week: 28.0 standard drinks    Types: 7 Standard drinks or equivalent, 21 Glasses of wine per week    Comment: 2-3 glass wine nightly   Drug use: No   Sexual activity: Yes  Other Topics Concern   Not on file  Social History Narrative   Not on file   Social Determinants of Health   Financial Resource Strain: Not on file  Food Insecurity: Not on file  Transportation Needs: Not on file  Physical Activity: Not on file  Stress: Not on file  Social Connections: Not on file   Additional Social History:    Pain Medications: see MAR Prescriptions: See MAR Over the Counter: see  MAR History of alcohol / drug use?: Yes Longest period of sobriety (when/how long): none reported Negative Consequences of Use: Personal relationships Withdrawal Symptoms: Other (Comment) (anxiety) Name of Substance 1: alcohol 1 - Age of First Use: unknown 1 - Amount (size/oz): 2 bottles wine 1 - Frequency: daily 1 - Duration: on-going 1 - Last Use / Amount: drank a bottle of wine and 4 mixed driks yesterday 1 - Method of Aquiring: store 1- Route  of Use: oral  Sleep: Good  Appetite:  Good  Current Medications: Current Facility-Administered Medications  Medication Dose Route Frequency Provider Last Rate Last Admin   acetaminophen (TYLENOL) tablet 650 mg  650 mg Oral Q6H PRN Nwoko, Nicole Kindred I, NP       alum & mag hydroxide-simeth (MAALOX/MYLANTA) 200-200-20 MG/5ML suspension 30 mL  30 mL Oral Q4H PRN Nwoko, Agnes I, NP       ARIPiprazole (ABILIFY) tablet 5 mg  5 mg Oral Daily Laveda Abbe, NP       fluticasone (FLONASE) 50 MCG/ACT nasal spray 2 spray  2 spray Each Nare Daily PRN Armandina Stammer I, NP       furosemide (LASIX) tablet 40 mg  40 mg Oral Daily Armandina Stammer I, NP   40 mg at 01/29/21 0802   hydrOXYzine (ATARAX/VISTARIL) tablet 25 mg  25 mg Oral TID PRN Laveda Abbe, NP       losartan (COZAAR) tablet 50 mg  50 mg Oral Daily Armandina Stammer I, NP   50 mg at 01/29/21 0802   magnesium hydroxide (MILK OF MAGNESIA) suspension 30 mL  30 mL Oral Daily PRN Armandina Stammer I, NP       multivitamin with minerals tablet 1 tablet  1 tablet Oral Daily Armandina Stammer I, NP   1 tablet at 01/29/21 0802   naltrexone (DEPADE) tablet 50 mg  50 mg Oral Daily Armandina Stammer I, NP   50 mg at 01/29/21 2585   nicotine (NICODERM CQ - dosed in mg/24 hours) patch 21 mg  21 mg Transdermal Q0600 Armandina Stammer I, NP   21 mg at 01/29/21 0802   [START ON 01/30/2021] sertraline (ZOLOFT) tablet 50 mg  50 mg Oral Daily Nwoko, Nicole Kindred I, NP       thiamine (B-1) injection 100 mg  100 mg Intramuscular Once Armandina Stammer I, NP       thiamine tablet 100 mg  100 mg Oral Daily Nwoko, Nicole Kindred I, NP   100 mg at 01/29/21 0802   traZODone (DESYREL) tablet 50 mg  50 mg Oral QHS PRN Armandina Stammer I, NP   50 mg at 01/28/21 2036   Lab Results:  No results found for this or any previous visit (from the past 48 hour(s)).  Blood Alcohol level:  Lab Results  Component Value Date   ETH <10 01/26/2021   Metabolic Disorder Labs: Lab Results  Component Value Date   HGBA1C 5.1 01/26/2021   MPG 99.67 01/26/2021   No results found for: PROLACTIN Lab Results  Component Value Date   CHOL 196 01/26/2021   TRIG 127 01/26/2021   HDL 72 01/26/2021   CHOLHDL 2.7 01/26/2021   VLDL 25 01/26/2021   LDLCALC 99 01/26/2021   Physical Findings: AIMS:  , ,  ,  ,    CIWA:  CIWA-Ar Total: 3 COWS:     Musculoskeletal: Strength & Muscle Tone: within normal limits Gait & Station: normal Patient leans: N/A  Psychiatric Specialty Exam:  Presentation  General Appearance: Casual; Fairly Groomed; Appropriate for Environment  Eye Contact:Good  Speech:Clear and Coherent; Normal Rate  Speech Volume:Normal  Handedness:Right  Mood and Affect  Mood:Anxious; Depressed  Affect:Congruent  Thought Process  Thought Processes:Coherent; Goal Directed  Descriptions of Associations:Intact  Orientation:Full (Time, Place and Person)  Thought Content:Logical  History of Schizophrenia/Schizoaffective disorder:No  Duration of Psychotic Symptoms: None Hallucinations: Denies  Ideas of Reference:None  Suicidal Thoughts: Denies  Homicidal Thoughts: Denies  Sensorium  Memory:Immediate Good; Recent Good; Remote Good  Judgment:Fair  Insight:Present; Good  Executive Functions  Concentration:Good  Attention Span:Good  Recall:Good  Fund of Knowledge:Fair  Language:Good  Psychomotor Activity  Psychomotor Activity: Within normal limits  Assets  Assets:Communication Skills; Desire for Improvement; Financial  Resources/Insurance; Housing; Resilience; Social Support  Sleep Sleep: 5.75 hours Fair  Physical Exam: Physical Exam Vitals and nursing note reviewed.  HENT:     Head: Normocephalic.     Nose: Nose normal.     Mouth/Throat:     Pharynx: Oropharynx is clear.  Eyes:     Pupils: Pupils are equal, round, and reactive to light.  Cardiovascular:     Rate and Rhythm: Normal rate.     Pulses: Normal pulses.  Pulmonary:     Effort: Pulmonary effort is normal.  Genitourinary:    Comments: Deferred Musculoskeletal:        General: Normal range of motion.     Cervical back: Normal range of motion.  Skin:    General: Skin is warm and dry.  Neurological:     General: No focal deficit present.     Mental Status: She is alert and oriented to person, place, and time.   Review of Systems  Constitutional:  Negative for chills, diaphoresis and fever.  HENT:  Negative for congestion and sore throat.   Eyes:  Negative for blurred vision.  Respiratory:  Negative for cough, shortness of breath and wheezing.   Cardiovascular:  Negative for chest pain and palpitations.  Gastrointestinal:  Negative for abdominal pain, blood in stool, constipation, diarrhea, heartburn, melena, nausea and vomiting.  Genitourinary:  Negative for dysuria.  Musculoskeletal:  Negative for joint pain and myalgias.  Neurological:  Negative for dizziness, tingling, tremors, sensory change, speech change, focal weakness, seizures, loss of consciousness, weakness and headaches.  Endo/Heme/Allergies:  Negative for environmental allergies and polydipsia. Does not bruise/bleed easily.       Allergies: Cefaclor                  Cephalosporins           Psychiatric/Behavioral:  Positive for depression and substance abuse (Hx. alcoholism (chronic)). Negative for hallucinations, memory loss and suicidal ideas. The patient is nervous/anxious. The patient does not have insomnia.    Blood pressure 119/87, pulse 89, temperature 97.6  F (36.4 C), temperature source Oral, resp. rate 20, height 5' 0.63" (1.54 m), weight 66.7 kg, SpO2 100 %. Body mass index is 28.12 kg/m.  Treatment Plan Summary: Daily contact with patient to assess and evaluate symptoms and progress in treatment and Medication management.   Continue inpatient hospitalization.  Will continue today 01/29/2021 plan as below except where it is noted.   PLAN: Safety and Monitoring:             -- Voluntary admission to inpatient psychiatric unit for safety, stabilization and treatment             -- Daily contact with patient to assess and evaluate symptoms and progress in treatment             -- Patient's case to be discussed in multi-disciplinary team meeting             -- Observation Level : q15 minute checks             -- Vital signs:  q12 hours             -- Precautions: suicide, elopement, and assault  2. Psychiatric Diagnoses and Treatment:    Alcohol withdrawal symptoms. Continue the CIWA detox protocols as already in progress/as recommended.   Alcohol craving. Continue Naltrexone 50 mg po daily    Major depressive disorder. Sertraline 25 mg (20 mg/ML conc concentrated solution) po once (Completed). Continue Sertraline 50 mg (20 mg/ML conc concentrated solution) po daily start (01-27-21). Initiate Abilify 5 mg PO daily for mood stabilization, first dose 10/24   Anxiety.  Continue Hydroxyzine 25 mg po po Q 6 hrs prn.   Insomnia.  Change Trazodone to 50 mg and may repeat x 1 po Q hs prn.   Other prn medications, continue:  Acetaminophen 650 mg po Q 6 hrs prn for pain/fever. Mylanta 30 mg po Q 4 hrs prn for indigestion.  Imodium 2-4 mg po prn for loose stools x 72 hrs. MOM 30 ml po daily prn for constipation.  Zofran-ODT 4 mg po Q 6 hrs prn for N/V.   Other medical issues (resumed): Flonase 2 sprays each nare for allergies. Furosemide 40 mg po daily for HTN/edema. Losartan 50 mg po daily for HTN. Multivitamin 1 tablet po daily  for low vitamin. Nicotine patch 21 mg trans-dermally Q 24 hrs for nicotine withdrawal symptoms. Thiamine 100 mg po or IM daily for low thiamine.    3. Discharge Planning:              -- Social work and case management to assist with discharge planning and identification of hospital follow-up needs prior to discharge             -- Estimated LOS: 5-7 days             -- Discharge Concerns: Need to establish a safety plan; Medication compliance and effectiveness             -- Discharge Goals: Interested in going to a rehab program & will need outpatient referrals for mental health follow-up including medication management/psychotherapy  Laveda Abbe, NP 01/29/2021, 2:26 PM

## 2021-01-29 NOTE — Progress Notes (Signed)
Psychoeducational Group Note  Date:  01/29/2021 Time:  2214  Group Topic/Focus:  Wrap-Up Group:   The focus of this group is to help patients review their daily goal of treatment and discuss progress on daily workbooks.  Participation Level: Did Not Attend  Participation Quality:  Not Applicable  Affect:  Not Applicable  Cognitive:  Not Applicable  Insight:  Not Applicable  Engagement in Group: Not Applicable  Additional Comments:  The patient did not attend group this evening.   [name] 01/29/2021, 10:14 PM

## 2021-01-29 NOTE — Group Note (Signed)
LCSW Group Therapy Note  Group Date: 01/29/2021 Start Time: 1300 End Time: 1400   Type of Therapy and Topic:  Group Therapy: Anger Cues and Responses  Participation Level:  Active   Description of Group:   In this group, patients learned how to recognize the physical, cognitive, emotional, and behavioral responses they have to anger-provoking situations.  They identified a recent time they became angry and how they reacted.  They analyzed how their reaction was possibly beneficial and how it was possibly unhelpful.  The group discussed a variety of healthier coping skills that could help with such a situation in the future.  Focus was placed on how helpful it is to recognize the underlying emotions to our anger, because working on those can lead to a more permanent solution as well as our ability to focus on the important rather than the urgent.  Therapeutic Goals: Patients will remember their last incident of anger and how they felt emotionally and physically, what their thoughts were at the time, and how they behaved. Patients will identify how their behavior at that time worked for them, as well as how it worked against them. Patients will explore possible new behaviors to use in future anger situations. Patients will learn that anger itself is normal and cannot be eliminated, and that healthier reactions can assist with resolving conflict rather than worsening situations.  Summary of Patient Progress:  Faelyn was active during the group. She participated in the activity but had to leave halfway through group due to needing to see the doctor. She demonstrated good insight into the subject matter, was respectful of peers, and participated throughout the entire session.  Therapeutic Modalities:   Cognitive Behavioral Therapy    Beatris Si, LCSW 01/29/2021  2:08 PM

## 2021-01-29 NOTE — BHH Counselor (Addendum)
CSW spoke with the Pt about her option for residential substance use both in-state and out-of-state.  CSW also spoke with the Pt about outpatient substance use treatment options as well.  The Pt states that she has been thinking about [facility] in [place] as a possible option. CSW talked with the Pt about a treatment center that is in [place].  The Pt states that she has friends in [place] that she is going to see in a couple weeks. The Pt states that she does not want a treatment center that participates in the 12-step program.  The Pt states that she does not want to attend [facility] because her son went there and she does not feel that it was a good program.  She states that she does not want to attend [facility] because she has a friend that works there.  The Pt also states that she is not sure that she wants to attend a treatment center that is out of state or more then an hour or 2 away.  The Pt reports that she is more interested in the outpatient options and states that she has family and friends at home that will help her.  CSW provided the Pt with a list of residential treatment centers and with a brochure to the facility in [place].  The weekend CSW had already provided the Pt with a brochure to [facility].  The Pt states that she will speak with the CSW if she changes her mind about residential treatment options but does agree to outpatient treatment at this time.

## 2021-01-29 NOTE — Progress Notes (Signed)
DAR NOTE: Patient presents with calm affect and depressed mood.  Denies suicidal thoughts, pain, auditory and visual hallucinations.  Described energy level as low with poor concentration.  Rates depression at 5, hopelessness at 0, and anxiety at 10.  Maintained on routine safety checks.  Medications given as prescribed.  Support and encouragement offered as needed.  Attended group and participated.  Patient observed socializing with peers in the dayroom.  Offered no complaint.

## 2021-01-30 DIAGNOSIS — F332 Major depressive disorder, recurrent severe without psychotic features: Secondary | ICD-10-CM | POA: Diagnosis not present

## 2021-01-30 MED ORDER — ARIPIPRAZOLE 2 MG PO TABS
2.0000 mg | ORAL_TABLET | Freq: Every day | ORAL | Status: DC
  Administered 2021-01-31 – 2021-02-01 (×2): 2 mg via ORAL
  Filled 2021-01-30 (×4): qty 1

## 2021-01-30 MED ORDER — ARIPIPRAZOLE 2 MG PO TABS: 2.0000 mg | ORAL_TABLET | Freq: Every day | ORAL | Status: DC

## 2021-01-30 NOTE — Group Note (Signed)
Group Topic: Goal Setting  Group Date: 01/30/2021 Start Time: 0830 End Time: 0900 Facilitators: Margaret Pyle, NT  Department: [facility] INPATIENT ADULT 400B  Number of Participants: 11  Group Focus: goals/reality orientation  Name: Tiffany Mueller Date of Birth: 07/25/1955  MR: [id number]    Level of Participation: Pt attended morning goals group.

## 2021-01-30 NOTE — Progress Notes (Signed)
Patient compliant with medication c/o Anxiety and sleep disturbance Prn vistaril 25 mg given and trazodone 50 mg  at HS and medication effective Patient in bed sleeping respirations noted. Denies SI/HI/A/VH and verbally contracted for safety. Q 15 minutes safety checks ongoing without self harm gestures.

## 2021-01-30 NOTE — Progress Notes (Addendum)
[facility] MD Progress Note  01/30/2021 11:56 AM HALLIE ERTL  MRN:  [id number]  Subjective:   Berneice reported " I am not feeling good today, just a little tired."  Evaluation: Jeffie was seen and evaluated resting in bed.  She presents with a bright and pleasant affect.  Denies suicidal or homicidal ideations.  Denies auditory or visual hallucinations.  Reported nausea and diarrhea related to possible medications.  Stated her mood has been improving overall.    [facility] states she has been taking Zoloft and Abilify as directed.  Discussed titrating Abilify downward from 5 mg to 2 mg.  Education provided with taking medication with food.  Patient was receptive to plan.  Keaton reports a good appetite.  States she is rested well throughout the night.  Denies withdrawal symptoms and/or alcohol cravings.  Patient encouraged to participate within the therapeutic milieu.  Support, encouragement and  reassurance was provided.  Principal Problem: Major depressive disorder, recurrent episode, severe (HCC) Diagnosis: Principal Problem:   Major depressive disorder, recurrent episode, severe (HCC) Active Problems:   Alcohol use disorder, severe, dependence (HCC)  Total Time spent with patient: 15 minutes  Past Psychiatric History:   Past Medical History:  Past Medical History:  Diagnosis Date   ADD (attention deficit disorder)    Anastomotic stricture of gastrojejunostomy 2002   EGD dilation   Anxiety    Arthritis    Carpal tunnel syndrome    Depression    Hypertension    Osteoporosis     Past Surgical History:  Procedure Laterality Date   ABDOMINAL HYSTERECTOMY  2007   APPENDECTOMY     CARPAL TUNNEL RELEASE Left 07/04/2015   CARPAL TUNNEL RELEASE Left 07/04/2015   Procedure: LEFT CARPAL TUNNEL RELEASE;  Surgeon: Dominica Severin, MD;  Location: [facility] OR;  Service: Orthopedics;  Laterality: Left;   ESOPHAGOGASTRODUODENOSCOPY (EGD) WITH ESOPHAGEAL DILATION  2002   Dr Randa Evens.  GJ stricture    FRACTURE SURGERY     GASTRIC BYPASS  2001   [facility]   LAPAROSCOPIC CHOLECYSTECTOMY     LAPAROSCOPIC OOPHERECTOMY     LIGAMENT REPAIR Right    "wrist"   OPEN REDUCTION INTERNAL FIXATION (ORIF) DISTAL RADIAL FRACTURE Left 07/04/2015   WITH ALLOGRAFT BONE GRAFTING    OPEN REDUCTION INTERNAL FIXATION (ORIF) DISTAL RADIAL FRACTURE Left 07/04/2015   Procedure: OPEN REDUCTION INTERNAL FIXATION (ORIF) LEFT DISTAL RADIAL FRACTURE WITH ALLOGRAFT BONE GRAFTING;  Surgeon: Dominica Severin, MD;  Location: [facility] OR;  Service: Orthopedics;  Laterality: Left;   STRABISMUS SURGERY Bilateral    "4 on the left; 1 on right"   TUBAL LIGATION     Family History:  Family History  Problem Relation Age of Onset   Alzheimer's disease Mother    Breast cancer Mother    Atrial fibrillation Father    Breast cancer Maternal Grandmother    Family Psychiatric  History: Social History:  Social History   Substance and Sexual Activity  Alcohol Use Yes   Alcohol/week: 28.0 standard drinks   Types: 7 Standard drinks or equivalent, 21 Glasses of wine per week   Comment: 2-3 glass wine nightly     Social History   Substance and Sexual Activity  Drug Use No    Social History   Socioeconomic History   Marital status: Married    Spouse name: Not on file   Number of children: Not on file   Years of education: Not on file   Highest education level: Not on file  Occupational History   Not on file  Tobacco Use   Smoking status: Every Day    Packs/day: 0.50    Years: 45.00    Pack years: 22.50    Types: Cigarettes   Smokeless tobacco: Never  Substance and Sexual Activity   Alcohol use: Yes    Alcohol/week: 28.0 standard drinks    Types: 7 Standard drinks or equivalent, 21 Glasses of wine per week    Comment: 2-3 glass wine nightly   Drug use: No   Sexual activity: Yes  Other Topics Concern   Not on file  Social History Narrative   Not on file   Social Determinants of Health   Financial Resource  Strain: Not on file  Food Insecurity: Not on file  Transportation Needs: Not on file  Physical Activity: Not on file  Stress: Not on file  Social Connections: Not on file   Additional Social History:    Pain Medications: see MAR Prescriptions: See MAR Over the Counter: see MAR History of alcohol / drug use?: Yes Longest period of sobriety (when/how long): none reported Negative Consequences of Use: Personal relationships Withdrawal Symptoms: Other (Comment) (anxiety) Name of Substance 1: alcohol 1 - Age of First Use: unknown 1 - Amount (size/oz): 2 bottles wine 1 - Frequency: daily 1 - Duration: on-going 1 - Last Use / Amount: drank a bottle of wine and 4 mixed driks yesterday 1 - Method of Aquiring: store 1- Route of Use: oral                  Sleep: Fair  Appetite:  Fair  Current Medications: Current Facility-Administered Medications  Medication Dose Route Frequency Provider Last Rate Last Admin   acetaminophen (TYLENOL) tablet 650 mg  650 mg Oral Q6H PRN Armandina Stammer I, NP   650 mg at 01/30/21 0647   alum & mag hydroxide-simeth (MAALOX/MYLANTA) 200-200-20 MG/5ML suspension 30 mL  30 mL Oral Q4H PRN Armandina Stammer I, NP       [START ON 01/31/2021] ARIPiprazole (ABILIFY) tablet 2 mg  2 mg Oral Daily Lewis, Jerene Pitch, NP       fluticasone (FLONASE) 50 MCG/ACT nasal spray 2 spray  2 spray Each Nare Daily PRN Nwoko, Agnes I, NP       furosemide (LASIX) tablet 40 mg  40 mg Oral Daily Nwoko, Agnes I, NP   40 mg at 01/29/21 0802   hydrOXYzine (ATARAX/VISTARIL) tablet 25 mg  25 mg Oral TID PRN Laveda Abbe, NP   25 mg at 01/30/21 0656   losartan (COZAAR) tablet 50 mg  50 mg Oral Daily Armandina Stammer I, NP   50 mg at 01/29/21 0802   magnesium hydroxide (MILK OF MAGNESIA) suspension 30 mL  30 mL Oral Daily PRN Armandina Stammer I, NP       multivitamin with minerals tablet 1 tablet  1 tablet Oral Daily Nwoko, Agnes I, NP   1 tablet at 01/30/21 0958   naltrexone (DEPADE) tablet  50 mg  50 mg Oral Daily Nwoko, Nicole Kindred I, NP   50 mg at 01/30/21 0958   nicotine (NICODERM CQ - dosed in mg/24 hours) patch 21 mg  21 mg Transdermal Q0600 Armandina Stammer I, NP   21 mg at 01/29/21 0802   sertraline (ZOLOFT) tablet 50 mg  50 mg Oral Daily Nwoko, Nicole Kindred I, NP   50 mg at 01/30/21 1000   thiamine (B-1) injection 100 mg  100 mg Intramuscular Once Armandina Stammer I,  NP       thiamine tablet 100 mg  100 mg Oral Daily Nwoko, Nicole Kindred I, NP   100 mg at 01/30/21 1610   traZODone (DESYREL) tablet 50 mg  50 mg Oral QHS PRN Armandina Stammer I, NP   50 mg at 01/29/21 2049    Lab Results: No results found for this or any previous visit (from the past 48 hour(s)).  Blood Alcohol level:  Lab Results  Component Value Date   ETH <10 01/26/2021    Metabolic Disorder Labs: Lab Results  Component Value Date   HGBA1C 5.1 01/26/2021   MPG 99.67 01/26/2021   No results found for: PROLACTIN Lab Results  Component Value Date   CHOL 196 01/26/2021   TRIG 127 01/26/2021   HDL 72 01/26/2021   CHOLHDL 2.7 01/26/2021   VLDL 25 01/26/2021   LDLCALC 99 01/26/2021    Physical Findings: AIMS:  , ,  ,  ,    CIWA:  CIWA-Ar Total: 6 COWS:     Musculoskeletal: Strength & Muscle Tone: within normal limits Gait & Station: normal Patient leans: N/A  Psychiatric Specialty Exam:  Presentation  General Appearance: Casual; Fairly Groomed; Appropriate for Environment  Eye Contact:Good  Speech:Clear and Coherent; Normal Rate  Speech Volume:Normal  Handedness:Right   Mood and Affect  Mood:Anxious; Depressed  Affect:Congruent   Thought Process  Thought Processes:Coherent; Goal Directed  Descriptions of Associations:Intact  Orientation:Full (Time, Place and Person)  Thought Content:Logical  History of Schizophrenia/Schizoaffective disorder:No  Duration of Psychotic Symptoms:No data recorded Hallucinations: Denies Ideas of Reference:None  Suicidal Thoughts: Denies Homicidal Thoughts:  Denies  Sensorium  Memory:Immediate Good; Recent Good; Remote Good  Judgment:Fair  Insight:Present; Good   Executive Functions  Concentration:Good  Attention Span:Good  Recall:Good  Fund of Knowledge:Fair  Language:Good   Psychomotor Activity  Psychomotor Activity:No data recorded  Assets  Assets:Communication Skills; Desire for Improvement; Financial Resources/Insurance; Housing; Resilience; Social Support   Sleep  Sleep:No data recorded   Physical Exam: Physical Exam Vitals and nursing note reviewed.  HENT:     Head: Normocephalic.  Cardiovascular:     Rate and Rhythm: Normal rate.     Pulses: Normal pulses.  Neurological:     Mental Status: She is alert and oriented to person, place, and time.  Psychiatric:        Mood and Affect: Mood normal.        Thought Content: Thought content normal.   Review of Systems  Eyes: Negative.   Cardiovascular: Negative.   Gastrointestinal:  Positive for nausea.  Genitourinary: Negative.   Skin: Negative.   Neurological:  Positive for headaches.  Psychiatric/Behavioral:  Positive for depression. The patient is nervous/anxious.   All other systems reviewed and are negative. Blood pressure 106/63, pulse 60, temperature 99 F (37.2 C), temperature source Oral, resp. rate 18, height 5' 0.63" (1.54 m), weight 66.7 kg, SpO2 100 %. Body mass index is 28.12 kg/m.   Treatment Plan Summary: Daily contact with patient to assess and evaluate symptoms and progress in treatment and Medication management  Major depressive disorder recurrent: Alcohol use disorder (sever)   Continue with current treatment plan  on 01/30/2021 as indicated below except for noted  Continue Zoloft 50 mg p.o. daily Continue Depakote 850 mg p.o. daily Decrease Abilify 5 mg to 2 mg p.o. daily first dose 01/31/2021 Continue trazodone 50 mg p.o. nightly   CSW to continue working on discharge disposition  Patient encouraged to participate within  the therapeutic  milieu   Oneta Rack, NP 01/30/2021, 11:56 AM   Total Time Spent in Direct Patient Care:  I personally spent 30 minutes on the unit in direct patient care. The direct patient care time included face-to-face time with the patient, reviewing the patient's chart, communicating with other professionals, and coordinating care. Greater than 50% of this time was spent in counseling or coordinating care with the patient regarding goals of hospitalization, psycho-education, and discharge planning needs.  I have independently evaluated the patient during a face-to-face assessment on 01/30/21. I reviewed the patient's chart, and I participated in key portions of the service. I discussed the case with the APP, and I agree with the assessment and plan of care as documented in the APP's note, , as addended by me or notated below:  I saw this patient this afternoon.  Agree with note and plan to decrease his Abilify to 10 mg once daily.  We will monitor hypotension, dizziness, GI symptoms.  Depression is somewhat better.   Phineas Inches, MD Psychiatrist

## 2021-01-30 NOTE — [facility] Group Notes (Signed)
Patient did not attend the Psycho-Ed group. 

## 2021-01-30 NOTE — B[facility] Counselor (Signed)
CSW spoke with the Pt who stated that she would like to go to [facility] and believes that her friend no longer works there.  CSW contacted [facility] [phone] who stated that since the Pt has BCBS insurance that supplements her Medicare then she would need to pay 17,500 dollars before she could be admitted to their facility.  CSW presented the Pt with this information and the Pt stated that she would do outpatient treatment instead.

## 2021-01-30 NOTE — Progress Notes (Signed)
Patient did not rate her day since she claims that she rested all day. Her blood pressure was low and it took much of the day to resolve the issue. Her goal for tomorrow is to address her medication along with her discharge planning.

## 2021-01-30 NOTE — Progress Notes (Signed)
   01/30/21 1600  Psych Admission Type (Psych Patients Only)  Admission Status Voluntary  Psychosocial Assessment  Patient Complaints None  Eye Contact Fair  Facial Expression Flat  Affect Appropriate to circumstance  Speech Logical/coherent  Interaction Assertive  Motor Activity Other (Comment) (WDL)  Appearance/Hygiene Unremarkable  Behavior Characteristics Cooperative  Mood Pleasant  Thought Process  Coherency WDL  Content WDL  Delusions None reported or observed  Perception WDL  Hallucination None reported or observed  Judgment Impaired  Confusion None  Danger to Self  Current suicidal ideation? Denies  Danger to Others  Danger to Others None reported or observed

## 2021-01-30 NOTE — Progress Notes (Signed)
   01/30/21 2725 01/30/21 3664 01/30/21 0716  Vital Signs  Temp  --  98.9 F (37.2 C)  --   Temp Source  --  Oral  --   Pulse Rate 67 82 (!) 50  Pulse Rate Source  --   --  Monitor  Resp  --   --  20  BP 135/68 99/66 (!) 91/51  BP Location Right Arm Right Arm Right Arm  BP Method Automatic Automatic Automatic  Patient Position (if appropriate) Sitting Standing Lying   Patient c/o headache 8/10, nausea, vomiting and diarrhea x 1 this morning, Prn Tylenol and vistaril given and tolerated`. Patient felt dizzy while standing at the med window. Patient assisted to the chair. Ginger ale given and tolerated. Patient assisted to her room v/s checked BP 91/51, hr 50. Patient encouraged to drink more fluids. Support and encouragement provided. Oncoming RN notified to notify Provider. Patient alert and oriented x 3 stated sometimes she feels nauseated due to gastric bypass she had years ago. Will continue to monitor.

## 2021-01-31 ENCOUNTER — Encounter (HOSPITAL_COMMUNITY): Payer: Self-pay

## 2021-01-31 MED ORDER — SERTRALINE HCL 25 MG PO TABS
75.0000 mg | ORAL_TABLET | Freq: Every day | ORAL | Status: DC
  Filled 2021-01-31 (×2): qty 3

## 2021-01-31 MED ORDER — SERTRALINE HCL 25 MG PO TABS
75.0000 mg | ORAL_TABLET | Freq: Every day | ORAL | Status: AC
  Administered 2021-02-01: 75 mg via ORAL
  Filled 2021-01-31: qty 3

## 2021-01-31 MED ORDER — SERTRALINE HCL 100 MG PO TABS
100.0000 mg | ORAL_TABLET | Freq: Every day | ORAL | Status: DC
  Administered 2021-02-02: 100 mg via ORAL
  Filled 2021-01-31 (×3): qty 1

## 2021-01-31 NOTE — Group Note (Signed)
LCSW Group Therapy Note   Group Date: 01/31/2021 Start Time: 1300 End Time: 1330   Type of Therapy and Topic:  Group Therapy:   Therapy Type: Group Therapy  Participation Level:  Did Not Attend   Patients received a worksheet with an outline of 2 gingerbread men with a separation in the middle of the page. One sign designated what the pt sees about themselves and the other is what others see. Pts were asked to introduce themselves and share something they like about themself. Pts were then asked to draw, write or color how they view themselves as well as how they are viewed by others. CSW led discussion about the feelings and words associated with each side.   Patient Summary:   Pt did not attend.  Felizardo Hoffmann, LCSWA 01/31/2021  1:39 PM
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
## 2021-01-31 NOTE — Group Note (Signed)
Recreation Therapy Group Note   Group Topic:Stress Management  Group Date: 01/31/2021 Start Time: 0935 End Time: 0953 Facilitators: Caroll Rancher, LRT/CTRS Location: [address] Dayroom   Goal Area(s) Addresses:  Patient will actively participate in stress management techniques presented during session.  Patient will successfully identify benefit of practicing stress management post d/c.    Group Description: Guided Imagery. LRT provided education, instruction, and demonstration on practice of visualization via guided imagery. Patient was asked to participate in the technique introduced during session. Patients were given suggestions of ways to access scripts post d/c and encouraged to explore Youtube and other apps available on smartphones, tablets, and computers.   Affect/Mood: Appropriate   Participation Level: Engaged   Participation Quality: Independent   Behavior: Appropriate   Speech/Thought Process: Focused   Insight: Good   Judgement: Good   Modes of Intervention: [employer], Script   Patient Response to Interventions:  Engaged   Education Outcome:  Acknowledges education and In group clarification offered    Clinical Observations/Individualized Feedback: Pt participated in group.  Pt did have moments of not being able to concentrate do to peer interrupting because of "fear" of the theme of the script.    Plan: Continue to engage patient in RT group sessions 2-3x/week.   Chandra Feger Linday, LRT/CTRS 01/31/2021 12:21 PM

## 2021-01-31 NOTE — Progress Notes (Signed)
The patient's coping skill is to use humor. Her goal for tomorrow is to find additional coping skills.

## 2021-01-31 NOTE — Progress Notes (Signed)
[facility] MD Progress Note  01/31/2021 5:03 PM Tiffany Mueller  MRN:  [id number]  Subjective: Tiffany Mueller reports, "I'm a little worried about going home. [name] still drinks heavily. He came in here the other day drunk. He was asking me where to hide the alcohol in the house. I don't think that I should be dealing with his alcoholism while trying to recover from my own problems with alcoholism. The SW has dropped off for me some possible places to go after discharge to continue substance abuse treatment. But some of these places do not accept [employer]. I may have to resort to going to an outpatient program after discharge. I'm also willing to give the EMDR a try as well. But, I'm not ready to be discharged from here as of yet".  Daily notes: Tiffany Mueller is seen, chart reviewed. The chart findings discussed with the treatment team. She presents alert, oriented & aware of situation. She is visible on the unit, attending some group group sessions. Says she does not always attend all the group session because of a particular patient who is always loud & disruptive. She presents today tearful, worried because of her husband's drinking problems. She says she is not ready to be discharged as of yet. She is taking & tolerating her treatment regimen. He Sertraline dose has been adjusted.to meet her needs. See MAR. She continues to deny any SIHI, AVH, delusional thoughts or paranoia. She does not appear to be responding to any internal stimuli. Will continue current plan of care as already in progress. Tiffany Mueller is currently evaluation her substance abuse treatment option after discharge.   Objective: 65 year old female with past psychiatric history of major depressive disorder and alcohol use disorder was admitted to the psychiatric unit for safety and evaluation and treatment of worsening depression and suicidal thoughts.  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  Denies  history of suicide attempt.  Principal Problem: Major depressive disorder, recurrent episode, severe (HCC)  Diagnosis: Principal Problem:   Major depressive disorder, recurrent episode, severe (HCC) Active Problems:   Alcohol use disorder, severe, dependence (HCC)  Total Time spent with patient:  25 minutes  Past Psychiatric History: Alcohol use disorder, Major depressive disorder.  Past Medical History:  Past Medical History:  Diagnosis Date   ADD (attention deficit disorder)    Anastomotic stricture of gastrojejunostomy 2002   EGD dilation   Anxiety    Arthritis    Carpal tunnel syndrome    Depression    Hypertension    Osteoporosis     Past Surgical History:  Procedure Laterality Date   ABDOMINAL HYSTERECTOMY  2007   APPENDECTOMY     CARPAL TUNNEL RELEASE Left 07/04/2015   CARPAL TUNNEL RELEASE Left 07/04/2015   Procedure: LEFT CARPAL TUNNEL RELEASE;  Surgeon: Dominica Severin, MD;  Location: [facility] OR;  Service: Orthopedics;  Laterality: Left;   ESOPHAGOGASTRODUODENOSCOPY (EGD) WITH ESOPHAGEAL DILATION  2002   Dr Randa Evens.  GJ stricture   FRACTURE SURGERY     GASTRIC BYPASS  2001   [facility]   LAPAROSCOPIC CHOLECYSTECTOMY     LAPAROSCOPIC OOPHERECTOMY     LIGAMENT REPAIR Right    "wrist"   OPEN REDUCTION INTERNAL FIXATION (ORIF) DISTAL RADIAL FRACTURE Left 07/04/2015   WITH ALLOGRAFT BONE GRAFTING    OPEN REDUCTION INTERNAL FIXATION (ORIF) DISTAL RADIAL FRACTURE Left 07/04/2015   Procedure: OPEN REDUCTION INTERNAL FIXATION (ORIF) LEFT DISTAL RADIAL FRACTURE WITH ALLOGRAFT BONE GRAFTING;  Surgeon:  Dominica Severin, MD;  Location: [facility] OR;  Service: Orthopedics;  Laterality: Left;   STRABISMUS SURGERY Bilateral    "4 on the left; 1 on right"   TUBAL LIGATION     Family History:  Family History  Problem Relation Age of Onset   Alzheimer's disease Mother    Breast cancer Mother    Atrial fibrillation Father    Breast cancer Maternal Grandmother    Family Psychiatric   History: Bipolar disorder: Son.                                                  Alcoholism: Son. Social History:  Social History   Substance and Sexual Activity  Alcohol Use Yes   Alcohol/week: 28.0 standard drinks   Types: 7 Standard drinks or equivalent, 21 Glasses of wine per week   Comment: 2-3 glass wine nightly     Social History   Substance and Sexual Activity  Drug Use No    Social History   Socioeconomic History   Marital status: Married    Spouse name: Not on file   Number of children: Not on file   Years of education: Not on file   Highest education level: Not on file  Occupational History   Not on file  Tobacco Use   Smoking status: Every Day    Packs/day: 0.50    Years: 45.00    Pack years: 22.50    Types: Cigarettes   Smokeless tobacco: Never  Substance and Sexual Activity   Alcohol use: Yes    Alcohol/week: 28.0 standard drinks    Types: 7 Standard drinks or equivalent, 21 Glasses of wine per week    Comment: 2-3 glass wine nightly   Drug use: No   Sexual activity: Yes  Other Topics Concern   Not on file  Social History Narrative   Not on file   Social Determinants of Health   Financial Resource Strain: Not on file  Food Insecurity: Not on file  Transportation Needs: Not on file  Physical Activity: Not on file  Stress: Not on file  Social Connections: Not on file   Additional Social History:    Pain Medications: see MAR Prescriptions: See MAR Over the Counter: see MAR History of alcohol / drug use?: Yes Longest period of sobriety (when/how long): none reported Negative Consequences of Use: Personal relationships Withdrawal Symptoms: Other (Comment) (anxiety) Name of Substance 1: alcohol 1 - Age of First Use: unknown 1 - Amount (size/oz): 2 bottles wine 1 - Frequency: daily 1 - Duration: on-going 1 - Last Use / Amount: drank a bottle of wine and 4 mixed driks yesterday 1 - Method of Aquiring: store 1- Route of Use: oral  Sleep:  Good  Appetite:  Good  Current Medications: Current Facility-Administered Medications  Medication Dose Route Frequency Provider Last Rate Last Admin   acetaminophen (TYLENOL) tablet 650 mg  650 mg Oral Q6H PRN Armandina Stammer I, NP   650 mg at 01/30/21 0647   alum & mag hydroxide-simeth (MAALOX/MYLANTA) 200-200-20 MG/5ML suspension 30 mL  30 mL Oral Q4H PRN Kaylan Friedmann, Nicole Kindred I, NP       ARIPiprazole (ABILIFY) tablet 2 mg  2 mg Oral Daily Oneta Rack, NP   2 mg at 01/31/21 0824   fluticasone (FLONASE) 50 MCG/ACT nasal spray 2 spray  2  spray Each Nare Daily PRN Armandina Stammer I, NP       furosemide (LASIX) tablet 40 mg  40 mg Oral Daily Armandina Stammer I, NP   40 mg at 01/31/21 3500   hydrOXYzine (ATARAX/VISTARIL) tablet 25 mg  25 mg Oral TID PRN Laveda Abbe, NP   25 mg at 01/30/21 0656   losartan (COZAAR) tablet 50 mg  50 mg Oral Daily Armandina Stammer I, NP   50 mg at 01/31/21 9381   magnesium hydroxide (MILK OF MAGNESIA) suspension 30 mL  30 mL Oral Daily PRN Armandina Stammer I, NP       multivitamin with minerals tablet 1 tablet  1 tablet Oral Daily Armandina Stammer I, NP   1 tablet at 01/31/21 8299   naltrexone (DEPADE) tablet 50 mg  50 mg Oral Daily Armandina Stammer I, NP   50 mg at 01/31/21 3716   nicotine (NICODERM CQ - dosed in mg/24 hours) patch 21 mg  21 mg Transdermal Q0600 Armandina Stammer I, NP   21 mg at 01/31/21 9678   [START ON 02/02/2021] sertraline (ZOLOFT) tablet 100 mg  100 mg Oral Daily Massengill, Harrold Donath, MD       Melene Muller ON 02/01/2021] sertraline (ZOLOFT) tablet 75 mg  75 mg Oral Daily Massengill, Nathan, MD       thiamine (B-1) injection 100 mg  100 mg Intramuscular Once Armandina Stammer I, NP       thiamine tablet 100 mg  100 mg Oral Daily Morena Mckissack, Nicole Kindred I, NP   100 mg at 01/31/21 9381   traZODone (DESYREL) tablet 50 mg  50 mg Oral QHS PRN Armandina Stammer I, NP   50 mg at 01/30/21 2110   Lab Results:  No results found for this or any previous visit (from the past 48 hour(s)).  Blood Alcohol  level:  Lab Results  Component Value Date   ETH <10 01/26/2021   Metabolic Disorder Labs: Lab Results  Component Value Date   HGBA1C 5.1 01/26/2021   MPG 99.67 01/26/2021   No results found for: PROLACTIN Lab Results  Component Value Date   CHOL 196 01/26/2021   TRIG 127 01/26/2021   HDL 72 01/26/2021   CHOLHDL 2.7 01/26/2021   VLDL 25 01/26/2021   LDLCALC 99 01/26/2021   Physical Findings: AIMS:  , ,  ,  ,    CIWA:  CIWA-Ar Total: 0 COWS:     Musculoskeletal: Strength & Muscle Tone: within normal limits Gait & Station: normal Patient leans: N/A  Psychiatric Specialty Exam:  Presentation  General Appearance: Casual; Fairly Groomed  Eye Contact:Good  Speech:Normal Rate  Speech Volume:Normal  Handedness:Right  Mood and Affect  Mood:Depressed; Hopeless  Affect:Flat; Depressed; Tearful  Thought Process  Thought Processes:Coherent; Goal Directed; Linear  Descriptions of Associations:Intact  Orientation:Full (Time, Place and Person)  Thought Content:Logical  History of Schizophrenia/Schizoaffective disorder:No  Duration of Psychotic Symptoms:No data recorded Hallucinations:Hallucinations: None Description of Auditory Hallucinations: NA  Ideas of Reference:None  Suicidal Thoughts:Suicidal Thoughts: No SI Passive Intent and/or Plan: Without Plan; Without Intent; Without Means to Carry Out; Without Access to Means  Homicidal Thoughts:Homicidal Thoughts: No  Sensorium  Memory:Immediate Good; Recent Good; Remote Good  Judgment:Fair  Insight:Fair  Executive Functions  Concentration:Fair  Attention Span:Fair  Recall:Good  Fund of Knowledge:Good  Language:Good  Psychomotor Activity  Psychomotor Activity:Psychomotor Activity: Normal  Assets  Assets:Communication Skills; Desire for Improvement; Financial Resources/Insurance; Housing; Physical Health; Resilience; Social Support  Sleep  Sleep:Sleep: Good Number of  Hours of Sleep:  6.75  Physical Exam: Physical Exam Vitals and nursing note reviewed.  HENT:     Head: Normocephalic.     Nose: Nose normal.     Mouth/Throat:     Pharynx: Oropharynx is clear.  Eyes:     Pupils: Pupils are equal, round, and reactive to light.  Cardiovascular:     Rate and Rhythm: Normal rate.     Pulses: Normal pulses.  Pulmonary:     Effort: Pulmonary effort is normal.  Genitourinary:    Comments: Deferred Musculoskeletal:        General: Normal range of motion.     Cervical back: Normal range of motion.  Skin:    General: Skin is warm and dry.  Neurological:     General: No focal deficit present.     Mental Status: She is alert and oriented to person, place, and time.   Review of Systems  Constitutional:  Negative for chills, diaphoresis and fever.  HENT:  Negative for congestion and sore throat.   Eyes:  Negative for blurred vision.  Respiratory:  Negative for cough, shortness of breath and wheezing.   Cardiovascular:  Negative for chest pain and palpitations.  Gastrointestinal:  Negative for abdominal pain, blood in stool, constipation, diarrhea, heartburn, melena, nausea and vomiting.  Genitourinary:  Negative for dysuria.  Musculoskeletal:  Negative for joint pain and myalgias.  Neurological:  Negative for dizziness, tingling, tremors, sensory change, speech change, focal weakness, seizures, loss of consciousness, weakness and headaches.  Endo/Heme/Allergies:  Negative for environmental allergies and polydipsia. Does not bruise/bleed easily.       Allergies: Cefaclor                  Cephalosporins           Psychiatric/Behavioral:  Positive for depression and substance abuse (Hx. alcoholism (chronic)). Negative for hallucinations, memory loss and suicidal ideas. The patient is nervous/anxious. The patient does not have insomnia.   Blood pressure (!) 145/78, pulse 65, temperature 98.7 F (37.1 C), resp. rate 18, height 5' 0.63" (1.54 m), weight 66.7 kg, SpO2 100 %.  Body mass index is 28.12 kg/m.  Treatment Plan Summary: Daily contact with patient to assess and evaluate symptoms and progress in treatment and Medication management.   Continue inpatient hospitalization.  Will continue today 01/31/2021 plan as below except where it is noted.    PLAN: Safety and Monitoring:             -- Voluntary admission to inpatient psychiatric unit for safety, stabilization and treatment             -- Daily contact with patient to assess and evaluate symptoms and progress in treatment             -- Patient's case to be discussed in multi-disciplinary team meeting             -- Observation Level : q15 minute checks             -- Vital signs:  q12 hours             -- Precautions: suicide, elopement, and assault   2. Psychiatric Diagnoses and Treatment:    Alcohol withdrawal symptoms. Discontinued the CIWA detox protocols as already in progress/as recommended.   Alcohol craving. Continue Naltrexone 50 mg po daily (starting tomorrow).   Major depressive disorder. Continue Abilify 2 mg po daily to augment antidepressant. Increased  Sertraline from 50 to 75  mg po daily (02-01-21). Increased Sertraline from 75 mg to 100 mg po daily (starting on 02-02-21).   Anxiety.  Continue Hydroxyzine 25 mg po po Q 6 hrs prn.   Insomnia.  Continue Trazodone 50 mg po Q hs prn.   Other prn medications, continue:  Acetaminophen 650 mg po Q 6 hrs prn for pain/fever. Mylanta 30 mg po Q 4 hrs prn for indigestion.  Imodium 2-4 mg po prn for loose stools x 72 hrs. MOM 30 ml po daily prn for constipation.  Zofran-ODT 4 mg po Q 6 hrs prn for N/V.   Other medical issues (resumed): Flonase 2 sprays each nare for allergies. Furosemide 40 mg po daily for HTN/edema. Losartan 50 mg po daily for HTN. Multivitamin 1 tablet po daily for low vitamin. Nicotine patch 21 mg trans-dermally Q 24 hrs for nicotine withdrawal symptoms. Thiamine 100 mg po or IM daily for low thiamine.     3. Discharge Planning:              -- Social work and case management to assist with discharge planning and identification of hospital follow-up needs prior to discharge             -- Estimated LOS: 5-7 days             -- Discharge Concerns: Need to establish a safety plan; Medication compliance and effectiveness             -- Discharge Goals: Interested in going to a rehab program & will need outpatient referrals for mental health follow-up including medication management/psychotherapy  Armandina Stammer, NP, PMHNP, FNP-BC 01/31/2021, 5:03 PM Patient ID: Tiffany Mueller, female   DOB: 1955-05-08, 65 y.o.   MRN: [id number] Patient ID: Tiffany Mueller, female   DOB: February 04, 1956, 65 y.o.   MRN: [id number]

## 2021-01-31 NOTE — BH IP Treatment Plan (Signed)
Interdisciplinary Treatment and Diagnostic Plan Update  01/31/2021 Time of Session: Did not attend, tx team update Tiffany Mueller MRN: [id number]  Principal Diagnosis: Major depressive disorder, recurrent episode, severe (HCC)  Secondary Diagnoses: Principal Problem:   Major depressive disorder, recurrent episode, severe (HCC) Active Problems:   Alcohol use disorder, severe, dependence (HCC)   Current Medications:  Current Facility-Administered Medications  Medication Dose Route Frequency Provider Last Rate Last Admin   acetaminophen (TYLENOL) tablet 650 mg  650 mg Oral Q6H PRN Armandina Stammer I, NP   650 mg at 01/30/21 0647   alum & mag hydroxide-simeth (MAALOX/MYLANTA) 200-200-20 MG/5ML suspension 30 mL  30 mL Oral Q4H PRN Armandina Stammer I, NP       ARIPiprazole (ABILIFY) tablet 2 mg  2 mg Oral Daily Oneta Rack, NP   2 mg at 01/31/21 0824   fluticasone (FLONASE) 50 MCG/ACT nasal spray 2 spray  2 spray Each Nare Daily PRN Armandina Stammer I, NP       furosemide (LASIX) tablet 40 mg  40 mg Oral Daily Armandina Stammer I, NP   40 mg at 01/31/21 8341   hydrOXYzine (ATARAX/VISTARIL) tablet 25 mg  25 mg Oral TID PRN Laveda Abbe, NP   25 mg at 01/30/21 0656   losartan (COZAAR) tablet 50 mg  50 mg Oral Daily Armandina Stammer I, NP   50 mg at 01/31/21 9622   magnesium hydroxide (MILK OF MAGNESIA) suspension 30 mL  30 mL Oral Daily PRN Armandina Stammer I, NP       multivitamin with minerals tablet 1 tablet  1 tablet Oral Daily Armandina Stammer I, NP   1 tablet at 01/31/21 0824   naltrexone (DEPADE) tablet 50 mg  50 mg Oral Daily Armandina Stammer I, NP   50 mg at 01/31/21 2979   nicotine (NICODERM CQ - dosed in mg/24 hours) patch 21 mg  21 mg Transdermal Q0600 Armandina Stammer I, NP   21 mg at 01/31/21 8921   [START ON 02/01/2021] sertraline (ZOLOFT) tablet 75 mg  75 mg Oral Daily Nwoko, Nicole Kindred I, NP       thiamine (B-1) injection 100 mg  100 mg Intramuscular Once Armandina Stammer I, NP       thiamine tablet 100 mg   100 mg Oral Daily Nwoko, Nicole Kindred I, NP   100 mg at 01/31/21 1941   traZODone (DESYREL) tablet 50 mg  50 mg Oral QHS PRN Armandina Stammer I, NP   50 mg at 01/30/21 2110   PTA Medications: Medications Prior to Admission  Medication Sig Dispense Refill Last Dose   amphetamine-dextroamphetamine (ADDERALL) 30 MG tablet Take 30 mg by mouth 2 (two) times daily.   01/25/2021   buPROPion (WELLBUTRIN XL) 150 MG 24 hr tablet Take 150 mg by mouth daily.  5 01/25/2021   fluticasone (FLONASE) 50 MCG/ACT nasal spray Place 2 sprays into both nostrils daily as needed for allergies.   Past Week   furosemide (LASIX) 40 MG tablet Take 40 mg by mouth daily.   01/25/2021   Multiple Vitamins-Minerals (MULTIVITAMIN GUMMIES ADULT PO) Take 1 tablet by mouth daily.   Past Week   naltrexone (DEPADE) 50 MG tablet Take 50 mg by mouth in the morning, at noon, and at bedtime.   01/24/2021   temazepam (RESTORIL) 15 MG capsule Take 15 mg by mouth at bedtime as needed for sleep.   2 weeks ago   losartan (COZAAR) 50 MG tablet Take 50 mg by mouth  daily.       Patient Stressors: Substance abuse    Patient Strengths: Supportive family/friends   Treatment Modalities: Medication Management, Group therapy, Case management,  1 to 1 session with clinician, Psychoeducation, Recreational therapy.   Physician Treatment Plan for Primary Diagnosis: Major depressive disorder, recurrent episode, severe (HCC) Long Term Goal(s): Improvement in symptoms so as ready for discharge   Short Term Goals: Ability to identify and develop effective coping behaviors will improve Compliance with prescribed medications will improve Ability to identify triggers associated with substance abuse/mental health issues will improve Ability to identify changes in lifestyle to reduce recurrence of condition will improve Ability to verbalize feelings will improve Ability to demonstrate self-control will improve  Medication Management: Evaluate patient's response,  side effects, and tolerance of medication regimen.  Therapeutic Interventions: 1 to 1 sessions, Unit Group sessions and Medication administration.  Evaluation of Outcomes: Progressing  Physician Treatment Plan for Secondary Diagnosis: Principal Problem:   Major depressive disorder, recurrent episode, severe (HCC) Active Problems:   Alcohol use disorder, severe, dependence (HCC)  Long Term Goal(s): Improvement in symptoms so as ready for discharge   Short Term Goals: Ability to identify and develop effective coping behaviors will improve Compliance with prescribed medications will improve Ability to identify triggers associated with substance abuse/mental health issues will improve Ability to identify changes in lifestyle to reduce recurrence of condition will improve Ability to verbalize feelings will improve Ability to demonstrate self-control will improve     Medication Management: Evaluate patient's response, side effects, and tolerance of medication regimen.  Therapeutic Interventions: 1 to 1 sessions, Unit Group sessions and Medication administration.  Evaluation of Outcomes: Progressing   RN Treatment Plan for Primary Diagnosis: Major depressive disorder, recurrent episode, severe (HCC) Long Term Goal(s): Knowledge of disease and therapeutic regimen to maintain health will improve  Short Term Goals: Ability to remain free from injury will improve, Ability to verbalize frustration and anger appropriately will improve, Ability to demonstrate self-control, Ability to participate in decision making will improve, Ability to verbalize feelings will improve, Ability to disclose and discuss suicidal ideas, and Ability to identify and develop effective coping behaviors will improve  Medication Management: RN will administer medications as ordered by provider, will assess and evaluate patient's response and provide education to patient for prescribed medication. RN will report any adverse  and/or side effects to prescribing provider.  Therapeutic Interventions: 1 on 1 counseling sessions, Psychoeducation, Medication administration, Evaluate responses to treatment, Monitor vital signs and CBGs as ordered, Perform/monitor CIWA, COWS, AIMS and Fall Risk screenings as ordered, Perform wound care treatments as ordered.  Evaluation of Outcomes: Progressing   LCSW Treatment Plan for Primary Diagnosis: Major depressive disorder, recurrent episode, severe (HCC) Long Term Goal(s): Safe transition to appropriate next level of care at discharge, Engage patient in therapeutic group addressing interpersonal concerns.  Short Term Goals: Engage patient in aftercare planning with referrals and resources, Increase social support, Increase ability to appropriately verbalize feelings, Increase emotional regulation, Facilitate acceptance of mental health diagnosis and concerns, Facilitate patient progression through stages of change regarding substance use diagnoses and concerns, Identify triggers associated with mental health/substance abuse issues, and Increase skills for wellness and recovery  Therapeutic Interventions: Assess for all discharge needs, 1 to 1 time with Social worker, Explore available resources and support systems, Assess for adequacy in community support network, Educate family and significant other(s) on suicide prevention, Complete Psychosocial Assessment, Interpersonal group therapy.  Evaluation of Outcomes: Progressing   Progress in  Treatment: Attending groups: No. Participating in groups: No. Taking medication as prescribed: Yes. Toleration medication: Yes. Family/Significant other contact made: Yes, individual(s) contacted:  husband, Joya Gaskins Patient understands diagnosis: Yes. Discussing patient identified problems/goals with staff: Yes. Medical problems stabilized or resolved: Yes. Denies suicidal/homicidal ideation: Yes. Issues/concerns per patient  self-inventory: No.  New problem(s) identified: No, Describe:  none reported  New Short Term/Long Term Goal(s):  medication stabilization, elimination of SI thoughts, development of comprehensive mental wellness plan.    Patient Goals:  Patient states she wants to be fixed. "I have depression. I am angry, bad body image, grief.  I am hurting those around me. I drink and smoke and stay home because I don't want anyone to see me"  Discharge Plan or Barriers: Patient recently admitted. CSW will continue to follow and assess for appropriate referrals and possible discharge planning.    Reason for Continuation of Hospitalization: Depression Medication stabilization Withdrawal symptoms  Estimated Length of Stay: 3-5 days   Scribe for Treatment Team: Beatris Si, LCSW 01/31/2021 3:31 PM

## 2021-01-31 NOTE — Progress Notes (Signed)
Patient c/o sleep disturbances PRN trazodone given at HS for sleep and effective Patient in bed sleeping respirations noted. No S/S of distress will continue to monitor.

## 2021-02-01 MED ORDER — TRAZODONE HCL 50 MG PO TABS
50.0000 mg | ORAL_TABLET | Freq: Every evening | ORAL | Status: DC | PRN
  Administered 2021-02-04 – 2021-02-05 (×2): 50 mg via ORAL
  Filled 2021-02-01: qty 1
  Filled 2021-02-01: qty 7
  Filled 2021-02-01: qty 1

## 2021-02-01 MED ORDER — LOPERAMIDE HCL 2 MG PO CAPS
4.0000 mg | ORAL_CAPSULE | ORAL | Status: DC | PRN
  Administered 2021-02-02 – 2021-02-05 (×4): 4 mg via ORAL
  Filled 2021-02-01 (×4): qty 2

## 2021-02-01 MED ORDER — HYDROXYZINE HCL 25 MG PO TABS
25.0000 mg | ORAL_TABLET | Freq: Four times a day (QID) | ORAL | Status: AC
  Administered 2021-02-01 – 2021-02-02 (×2): 25 mg via ORAL
  Filled 2021-02-01 (×4): qty 1

## 2021-02-01 MED ORDER — LOPERAMIDE HCL 2 MG PO CAPS
4.0000 mg | ORAL_CAPSULE | Freq: Once | ORAL | Status: AC
  Administered 2021-02-01: 4 mg via ORAL
  Filled 2021-02-01 (×2): qty 2

## 2021-02-01 MED ORDER — TRAZODONE HCL 100 MG PO TABS
100.0000 mg | ORAL_TABLET | Freq: Every day | ORAL | Status: DC
  Administered 2021-02-01 – 2021-02-03 (×3): 100 mg via ORAL
  Filled 2021-02-01 (×5): qty 1

## 2021-02-01 NOTE — [facility] Group Notes (Signed)
Adult Psychoeducational Group Note  Date:  02/01/2021 Time:  2:39 PM  Group Topic/Focus:  Wellness Toolbox:   The focus of this group is to discuss various aspects of wellness, balancing those aspects and exploring ways to increase the ability to experience wellness.  Patients will create a wellness toolbox for use upon discharge.  Participation Level:  Active  Participation Quality:  Attentive  Affect:  Appropriate  Cognitive:  Appropriate  Insight: Appropriate  Engagement in Group:  Engaged  Modes of Intervention:  Activity  Additional Comments:  Patient attended and participated in the relaxation activity.  Jearl Klinefelter 02/01/2021, 2:39 PM

## 2021-02-01 NOTE — Progress Notes (Signed)
Psychoeducational Group Note  Date:  02/01/2021 Time:  2015  Group Topic/Focus:  Wrap up group  Participation Level: Did Not Attend  Participation Quality:  Not Applicable  Affect:  Not Applicable  Cognitive:  Not Applicable  Insight:  Not Applicable  Engagement in Group: Not Applicable  Additional Comments:  Did not attend.  Marcille Buffy 02/01/2021, 9:32 PM

## 2021-02-01 NOTE — Progress Notes (Signed)
[facility] MD Progress Note  02/01/2021 3:22 PM Tiffany Mueller  MRN:  [id number]  Subjective: Tiffany Mueller reports, "I'm a little off this morning, did not sleep well last night. I'm a little agitated because I did not sleep well last night. I feel more anxious than depressed because I may be going home to the same situation. However, my son paid me a good visit last night. He promised to support me. He said he will be there for me throughout this sobriety journey. That made me feel feel good". Daily notes: Tiffany Mueller is seen, chart reviewed. The chart findings discussed with the treatment team. She presents alert, oriented & aware of situation. She is visible on the unit, attending some group group sessions. She presents today still tearful, worried because of her husband's drinking problems. However, says her son paid her a good visit yesterday with the promise of a good support for her throughout her sobriety journey. Nursing reports that patient had patches of reddened rash to her arm areas last night. A skin assessment this am during this follow-up showed the reddened patches on her skin has resolved. Patient instructed to take her Abilify 2 mg while we watch to assure if the patches will re-appear & they did as noted this afternoon. As a result, the Abilify 2 mg has been discontinued. Trazodone 100 mg ordered Q hs routinely for insomnia starting tonight. Patient remains on the Sertraline. She is taking & tolerating her treatment regimen. Her Sertraline dose was recently adjusted.to meet her needs. See MAR. She continues to deny any SIHI, AVH, delusional thoughts or paranoia. She does not appear to be responding to any internal stimuli. Will continue current plan of care as already in progress. Tiffany Mueller is currently evaluating her substance abuse treatment option after discharge.   Objective: 65 year old female with past psychiatric history of major depressive disorder and alcohol use disorder was admitted to the  psychiatric unit for safety and evaluation and treatment of worsening depression and suicidal thoughts.  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  Denies history of suicide attempt.  Principal Problem: Major depressive disorder, recurrent episode, severe (HCC)  Diagnosis: Principal Problem:   Major depressive disorder, recurrent episode, severe (HCC) Active Problems:   Alcohol use disorder, severe, dependence (HCC)  Total Time spent with patient: 15 minutes  Past Psychiatric History: Alcohol use disorder, Major depressive disorder.  Past Medical History:  Past Medical History:  Diagnosis Date   ADD (attention deficit disorder)    Anastomotic stricture of gastrojejunostomy 2002   EGD dilation   Anxiety    Arthritis    Carpal tunnel syndrome    Depression    Hypertension    Osteoporosis     Past Surgical History:  Procedure Laterality Date   ABDOMINAL HYSTERECTOMY  2007   APPENDECTOMY     CARPAL TUNNEL RELEASE Left 07/04/2015   CARPAL TUNNEL RELEASE Left 07/04/2015   Procedure: LEFT CARPAL TUNNEL RELEASE;  Surgeon: Dominica Severin, MD;  Location: [facility] OR;  Service: Orthopedics;  Laterality: Left;   ESOPHAGOGASTRODUODENOSCOPY (EGD) WITH ESOPHAGEAL DILATION  2002   Dr Randa Evens.  GJ stricture   FRACTURE SURGERY     GASTRIC BYPASS  2001   [facility]   LAPAROSCOPIC CHOLECYSTECTOMY     LAPAROSCOPIC OOPHERECTOMY     LIGAMENT REPAIR Right    "wrist"   OPEN REDUCTION INTERNAL FIXATION (ORIF) DISTAL RADIAL FRACTURE Left 07/04/2015   WITH ALLOGRAFT BONE GRAFTING    OPEN  REDUCTION INTERNAL FIXATION (ORIF) DISTAL RADIAL FRACTURE Left 07/04/2015   Procedure: OPEN REDUCTION INTERNAL FIXATION (ORIF) LEFT DISTAL RADIAL FRACTURE WITH ALLOGRAFT BONE GRAFTING;  Surgeon: Dominica Severin, MD;  Location: [facility] OR;  Service: Orthopedics;  Laterality: Left;   STRABISMUS SURGERY Bilateral    "4 on the left; 1 on right"   TUBAL LIGATION     Family History:   Family History  Problem Relation Age of Onset   Alzheimer's disease Mother    Breast cancer Mother    Atrial fibrillation Father    Breast cancer Maternal Grandmother    Family Psychiatric  History: Bipolar disorder: Son.                                                  Alcoholism: Son. Social History:  Social History   Substance and Sexual Activity  Alcohol Use Yes   Alcohol/week: 28.0 standard drinks   Types: 7 Standard drinks or equivalent, 21 Glasses of wine per week   Comment: 2-3 glass wine nightly     Social History   Substance and Sexual Activity  Drug Use No    Social History   Socioeconomic History   Marital status: Married    Spouse name: Not on file   Number of children: Not on file   Years of education: Not on file   Highest education level: Not on file  Occupational History   Not on file  Tobacco Use   Smoking status: Every Day    Packs/day: 0.50    Years: 45.00    Pack years: 22.50    Types: Cigarettes   Smokeless tobacco: Never  Substance and Sexual Activity   Alcohol use: Yes    Alcohol/week: 28.0 standard drinks    Types: 7 Standard drinks or equivalent, 21 Glasses of wine per week    Comment: 2-3 glass wine nightly   Drug use: No   Sexual activity: Yes  Other Topics Concern   Not on file  Social History Narrative   Not on file   Social Determinants of Health   Financial Resource Strain: Not on file  Food Insecurity: Not on file  Transportation Needs: Not on file  Physical Activity: Not on file  Stress: Not on file  Social Connections: Not on file   Additional Social History:    Pain Medications: see MAR Prescriptions: See MAR Over the Counter: see MAR History of alcohol / drug use?: Yes Longest period of sobriety (when/how long): none reported Negative Consequences of Use: Personal relationships Withdrawal Symptoms: Other (Comment) (anxiety) Name of Substance 1: alcohol 1 - Age of First Use: unknown 1 - Amount (size/oz): 2  bottles wine 1 - Frequency: daily 1 - Duration: on-going 1 - Last Use / Amount: drank a bottle of wine and 4 mixed driks yesterday 1 - Method of Aquiring: store 1- Route of Use: oral  Sleep: Good  Appetite:  Good  Current Medications: Current Facility-Administered Medications  Medication Dose Route Frequency Provider Last Rate Last Admin   acetaminophen (TYLENOL) tablet 650 mg  650 mg Oral Q6H PRN Armandina Stammer I, NP   650 mg at 01/31/21 2312   alum & mag hydroxide-simeth (MAALOX/MYLANTA) 200-200-20 MG/5ML suspension 30 mL  30 mL Oral Q4H PRN Taite Baldassari I, NP       fluticasone (FLONASE) 50 MCG/ACT nasal spray  2 spray  2 spray Each Nare Daily PRN Armandina Stammer I, NP       furosemide (LASIX) tablet 40 mg  40 mg Oral Daily Armandina Stammer I, NP   40 mg at 02/01/21 6759   hydrOXYzine (ATARAX/VISTARIL) tablet 25 mg  25 mg Oral TID PRN Laveda Abbe, NP   25 mg at 02/01/21 1510   hydrOXYzine (ATARAX/VISTARIL) tablet 25 mg  25 mg Oral Q6H Massengill, Nathan, MD       losartan (COZAAR) tablet 50 mg  50 mg Oral Daily Armandina Stammer I, NP   50 mg at 02/01/21 1638   magnesium hydroxide (MILK OF MAGNESIA) suspension 30 mL  30 mL Oral Daily PRN Armandina Stammer I, NP       multivitamin with minerals tablet 1 tablet  1 tablet Oral Daily Armandina Stammer I, NP   1 tablet at 02/01/21 4665   naltrexone (DEPADE) tablet 50 mg  50 mg Oral Daily Armandina Stammer I, NP   50 mg at 02/01/21 9935   nicotine (NICODERM CQ - dosed in mg/24 hours) patch 21 mg  21 mg Transdermal Q0600 Armandina Stammer I, NP   21 mg at 02/01/21 0700   [START ON 02/02/2021] sertraline (ZOLOFT) tablet 100 mg  100 mg Oral Daily Massengill, Nathan, MD       thiamine (B-1) injection 100 mg  100 mg Intramuscular Once Armandina Stammer I, NP       thiamine tablet 100 mg  100 mg Oral Daily Earlean Fidalgo, Nicole Kindred I, NP   100 mg at 02/01/21 7017   traZODone (DESYREL) tablet 100 mg  100 mg Oral QHS Massengill, Harrold Donath, MD       traZODone (DESYREL) tablet 50 mg  50 mg Oral  QHS PRN Massengill, Harrold Donath, MD       Lab Results:  No results found for this or any previous visit (from the past 48 hour(s)).  Blood Alcohol level:  Lab Results  Component Value Date   ETH <10 01/26/2021   Metabolic Disorder Labs: Lab Results  Component Value Date   HGBA1C 5.1 01/26/2021   MPG 99.67 01/26/2021   No results found for: PROLACTIN Lab Results  Component Value Date   CHOL 196 01/26/2021   TRIG 127 01/26/2021   HDL 72 01/26/2021   CHOLHDL 2.7 01/26/2021   VLDL 25 01/26/2021   LDLCALC 99 01/26/2021   Physical Findings: AIMS:  , ,  ,  ,    CIWA:  CIWA-Ar Total: 0 COWS:     Musculoskeletal: Strength & Muscle Tone: within normal limits Gait & Station: normal Patient leans: N/A  Psychiatric Specialty Exam:  Presentation  General Appearance: Casual; Fairly Groomed  Eye Contact:Good  Speech:Normal Rate  Speech Volume:Normal  Handedness:Right  Mood and Affect  Mood:Depressed; Hopeless  Affect:Flat; Depressed; Tearful  Thought Process  Thought Processes:Coherent; Goal Directed; Linear  Descriptions of Associations:Intact  Orientation:Full (Time, Place and Person)  Thought Content:Logical  History of Schizophrenia/Schizoaffective disorder:No  Duration of Psychotic Symptoms:Na Hallucinations:Hallucinations: None Description of Auditory Hallucinations: NA  Ideas of Reference:None  Suicidal Thoughts:Suicidal Thoughts: No SI Passive Intent and/or Plan: Without Plan; Without Intent; Without Means to Carry Out; Without Access to Means  Homicidal Thoughts:Homicidal Thoughts: No  Sensorium  Memory:Immediate Good; Recent Good; Remote Good  Judgment:Fair  Insight:Fair  Executive Functions  Concentration:Fair  Attention Span:Fair  Recall:Good  Fund of Knowledge:Good  Language:Good  Psychomotor Activity  Psychomotor Activity:Psychomotor Activity: Normal  Assets  Assets:Communication Skills; Desire for Improvement; Financial  Resources/Insurance; Housing; Physical Health; Resilience; Social Support  Sleep  Sleep:Sleep: Poor Number of Hours of Sleep: 3.75  Physical Exam: Physical Exam Vitals and nursing note reviewed.  HENT:     Head: Normocephalic.     Nose: Nose normal.     Mouth/Throat:     Pharynx: Oropharynx is clear.  Eyes:     Pupils: Pupils are equal, round, and reactive to light.  Cardiovascular:     Rate and Rhythm: Normal rate.     Pulses: Normal pulses.  Pulmonary:     Effort: Pulmonary effort is normal.  Genitourinary:    Comments: Deferred Musculoskeletal:        General: Normal range of motion.     Cervical back: Normal range of motion.  Skin:    General: Skin is warm and dry.  Neurological:     General: No focal deficit present.     Mental Status: She is alert and oriented to person, place, and time.   Review of Systems  Constitutional:  Negative for chills, diaphoresis and fever.  HENT:  Negative for congestion and sore throat.   Eyes:  Negative for blurred vision.  Respiratory:  Negative for cough, shortness of breath and wheezing.   Cardiovascular:  Negative for chest pain and palpitations.  Gastrointestinal:  Negative for abdominal pain, blood in stool, constipation, diarrhea, heartburn, melena, nausea and vomiting.  Genitourinary:  Negative for dysuria.  Musculoskeletal:  Negative for joint pain and myalgias.  Neurological:  Negative for dizziness, tingling, tremors, sensory change, speech change, focal weakness, seizures, loss of consciousness, weakness and headaches.  Endo/Heme/Allergies:  Negative for environmental allergies and polydipsia. Does not bruise/bleed easily.       Allergies: Cefaclor                  Cephalosporins           Psychiatric/Behavioral:  Positive for depression and substance abuse (Hx. alcoholism (chronic)). Negative for hallucinations, memory loss and suicidal ideas. The patient is nervous/anxious. The patient does not have insomnia.   Blood  pressure (!) 140/94, pulse 84, temperature 97.7 F (36.5 C), resp. rate 18, height 5' 0.63" (1.54 m), weight 66.7 kg, SpO2 100 %. Body mass index is 28.12 kg/m.  Treatment Plan Summary: Daily contact with patient to assess and evaluate symptoms and progress in treatment and Medication management.   Continue inpatient hospitalization.  Will continue today 02/01/2021 plan as below except where it is noted.    PLAN: Safety and Monitoring:             -- Voluntary admission to inpatient psychiatric unit for safety, stabilization and treatment             -- Daily contact with patient to assess and evaluate symptoms and progress in treatment             -- Patient's case to be discussed in multi-disciplinary team meeting             -- Observation Level : q15 minute checks             -- Vital signs:  q12 hours             -- Precautions: suicide, elopement, and assault   2. Psychiatric Diagnoses and Treatment:    Alcohol withdrawal symptoms. Discontinued the CIWA detox protocols as already in progress/as recommended.   Alcohol craving. Continue Naltrexone 50 mg po daily (starting tomorrow).   Major depressive disorder. Discontinued Abilify 2  mg po daily to augment antidepressant. Sertraline 75 mg po daily (02-01-21) (completed). Continue Sertraline 100 mg po daily (starting on 02-02-21).   Anxiety.  Continue Hydroxyzine 25 mg po po Q 6 hrs prn. Initiated Hydroxyzine 25 mg po Q 6 hrs prn for hives/itching x 4 doses.    Insomnia.  Initiated Trazodone 100 mg po Q hs (starting tonight 02-01-21). Continue Trazodone 50 mg po Q hs prn.   Other prn medications, continue:  Acetaminophen 650 mg po Q 6 hrs prn for pain/fever. Mylanta 30 mg po Q 4 hrs prn for indigestion.  Imodium 4 mg po once today for loose stools. MOM 30 ml po daily prn for constipation.  Discontinued Zofran-ODT 4 mg po Q 6 hrs prn for N/V.   Other medical issues (resumed): Flonase 2 sprays each nare for  allergies. Furosemide 40 mg po daily for HTN/edema. Losartan 50 mg po daily for HTN. Multivitamin 1 tablet po daily for low vitamin. Nicotine patch 21 mg trans-dermally Q 24 hrs for nicotine withdrawal symptoms. Thiamine 100 mg po daily for low thiamine.    3. Discharge Planning:              -- Social work and case management to assist with discharge planning and identification of hospital follow-up needs prior to discharge             -- Estimated LOS: 5-7 days             -- Discharge Concerns: Need to establish a safety plan; Medication compliance and effectiveness             -- Discharge Goals: Interested in going to a rehab program & will need outpatient referrals for mental health follow-up including medication management/psychotherapy  Armandina Stammer, NP, PMHNP, FNP-BC 02/01/2021, 3:22 PM Patient ID: Tiffany Mueller, female   DOB: 1956-03-30, 66 y.o.   MRN: [id number]

## 2021-02-01 NOTE — Progress Notes (Signed)
Patient stated she was having sleep disturbance this shift. Patient has been worried about her room mate living situation. Patient and room mate were talking most of the night. Required redirection and support. Prn Trazodone 50 mg given at 2115 Per request with little effect. Patient C/O anxiety at 0236 Prn atarax 25 mg PO given and effective.   Support and encouragement provided as needed, Q 15 minutes safety checks ongoing  without self harm gestures. Will continue to monitor.

## 2021-02-02 LAB — BASIC METABOLIC PANEL
Anion gap: 11 (ref 5–15)
BUN: 15 mg/dL (ref 8–23)
CO2: 20 mmol/L — ABNORMAL LOW (ref 22–32)
Calcium: 8.7 mg/dL — ABNORMAL LOW (ref 8.9–10.3)
Chloride: 106 mmol/L (ref 98–111)
Creatinine, Ser: 1.17 mg/dL — ABNORMAL HIGH (ref 0.44–1.00)
GFR, Estimated: 51 mL/min — ABNORMAL LOW (ref >60)
Glucose, Bld: 121 mg/dL — ABNORMAL HIGH (ref 70–99)
Potassium: 4.2 mmol/L (ref 3.5–5.1)
Sodium: 137 mmol/L (ref 135–145)

## 2021-02-02 MED ORDER — DESVENLAFAXINE SUCCINATE ER 50 MG PO TB24
50.0000 mg | ORAL_TABLET | Freq: Every day | ORAL | Status: DC
  Administered 2021-02-03 – 2021-02-05 (×3): 50 mg via ORAL
  Filled 2021-02-02 (×5): qty 1

## 2021-02-02 NOTE — Group Note (Addendum)
LCSW Group Therapy Note   Group Date: 02/02/2021 Start Time: 1300 End Time: 1400   Type of Therapy and Topic:  Group Therapy: Coping Skills  Participation Level:  Active  Topic:   Due to the acuity and complex discharge plans, group was not held. Patient was provided therapeutic worksheets and asked to meet with CSW as needed.  Felizardo Hoffmann, LCSWA 02/02/2021  2:51 PM

## 2021-02-02 NOTE — [facility] Group Notes (Signed)
Pt did not attend morning goal groups.  

## 2021-02-02 NOTE — Progress Notes (Signed)
   02/02/21 0000  Psych Admission Type (Psych Patients Only)  Admission Status Voluntary  Psychosocial Assessment  Patient Complaints Anxiety  Eye Contact Fair  Facial Expression Flat  Affect Appropriate to circumstance  Speech Logical/coherent  Interaction Assertive  Motor Activity Other (Comment) (WDL)  Appearance/Hygiene Unremarkable  Behavior Characteristics Cooperative  Mood Pleasant  Thought Process  Coherency WDL  Content WDL  Delusions None reported or observed  Perception WDL  Hallucination None reported or observed  Judgment Impaired  Confusion None  Danger to Self  Current suicidal ideation? Denies  Danger to Others  Danger to Others None reported or observed

## 2021-02-02 NOTE — Progress Notes (Signed)
Pt visible on the unit this evening, pt stated she felt better     02/02/21 2000  Psych Admission Type (Psych Patients Only)  Admission Status Voluntary  Psychosocial Assessment  Patient Complaints Anxiety  Eye Contact Fair  Facial Expression Flat  Affect Appropriate to circumstance  Speech Logical/coherent  Interaction Assertive  Motor Activity Other (Comment) (WDL)  Appearance/Hygiene Unremarkable  Behavior Characteristics Cooperative  Mood Anxious;Pleasant  Aggressive Behavior  Effect No apparent injury  Thought Process  Coherency WDL  Content WDL  Delusions None reported or observed  Perception WDL  Hallucination None reported or observed  Judgment Impaired  Confusion None  Danger to Self  Current suicidal ideation? Denies  Danger to Others  Danger to Others None reported or observed

## 2021-02-02 NOTE — Progress Notes (Addendum)
[facility] MD Progress Note  02/02/2021 3:43 PM DAZHANE VILLAGOMEZ  MRN:  [id number]  Subjective: Jessamine reports, "I want to stop taking Zoloft because I fill zombified & no emotions. I feel bombarded with all the stuff I hear & feel around here. There is one particular female patient that is troubled, reminded me of the horrific time we had with our son who has substance abuse issues.  People were telling me about their horrifying stories. It is making my anxiety go up & stay up". Daily notes: Stacye is seen, chart reviewed. The chart findings discussed with the treatment team. She presents alert, oriented & aware of situation. She is visible on the unit, attending some group sessions. She presents today requesting to stop her Sertraline because it is making her feel like a zombie & she lacks emotions. She is also complaining of feeling overwhelmed with the other patient's stories & reasons for their hospitalizations. She is being affected by what she described as horrific stories she could not help but to listen to/hear from the other patients. And with her consent, she is in agreement to start Pristiq 50 mg daily & Sertraline is discontinued. She is instructed to avoid engaging in any conversation that is distressing to her with any other patient. The reddened patches to her skin seem to be resolving. She continues to deny any SIHI, AVH, delusional thoughts or paranoia. She does not appear to be responding to any internal stimuli. Will continue current plan of care as already in progress. Walsie is currently evaluating her substance abuse treatment option after discharge & seem to be leaning towards outpatient substance abuse treatment services.   Objective: 65 year old female with past psychiatric history of major depressive disorder and alcohol use disorder was admitted to the psychiatric unit for safety and evaluation and treatment of worsening depression and suicidal thoughts.  Patient reports history of 1  psychiatric hospitalization for eating disorder in the 1990s.  Denies history of suicide attempt.  Principal Problem: Major depressive disorder, recurrent episode, severe (HCC)  Diagnosis: Principal Problem:   Major depressive disorder, recurrent episode, severe (HCC) Active Problems:   Alcohol use disorder, severe, dependence (HCC)  Total Time spent with patient: 15 minutes  Past Psychiatric History: Alcohol use disorder, Major depressive disorder.  Past Medical History:  Past Medical History:  Diagnosis Date   ADD (attention deficit disorder)    Anastomotic stricture of gastrojejunostomy 2002   EGD dilation   Anxiety    Arthritis    Carpal tunnel syndrome    Depression    Hypertension    Osteoporosis     Past Surgical History:  Procedure Laterality Date   ABDOMINAL HYSTERECTOMY  2007   APPENDECTOMY     CARPAL TUNNEL RELEASE Left 07/04/2015   CARPAL TUNNEL RELEASE Left 07/04/2015   Procedure: LEFT CARPAL TUNNEL RELEASE;  Surgeon: Dominica Severin, MD;  Location: [facility] OR;  Service: Orthopedics;  Laterality: Left;   ESOPHAGOGASTRODUODENOSCOPY (EGD) WITH ESOPHAGEAL DILATION  2002   Dr Randa Evens.  GJ stricture   FRACTURE SURGERY     GASTRIC BYPASS  2001   [facility]   LAPAROSCOPIC CHOLECYSTECTOMY     LAPAROSCOPIC OOPHERECTOMY     LIGAMENT REPAIR Right    "wrist"   OPEN REDUCTION INTERNAL FIXATION (ORIF) DISTAL RADIAL FRACTURE Left 07/04/2015   WITH ALLOGRAFT BONE GRAFTING    OPEN REDUCTION INTERNAL FIXATION (ORIF) DISTAL RADIAL FRACTURE Left 07/04/2015   Procedure: OPEN REDUCTION INTERNAL FIXATION (ORIF) LEFT DISTAL RADIAL FRACTURE WITH ALLOGRAFT  BONE GRAFTING;  Surgeon: Dominica Severin, MD;  Location: [facility] OR;  Service: Orthopedics;  Laterality: Left;   STRABISMUS SURGERY Bilateral    "4 on the left; 1 on right"   TUBAL LIGATION     Family History:  Family History  Problem Relation Age of Onset   Alzheimer's disease Mother    Breast cancer Mother    Atrial fibrillation Father     Breast cancer Maternal Grandmother    Family Psychiatric  History: Bipolar disorder: Son.                                                  Alcoholism: Son. Social History:  Social History   Substance and Sexual Activity  Alcohol Use Yes   Alcohol/week: 28.0 standard drinks   Types: 7 Standard drinks or equivalent, 21 Glasses of wine per week   Comment: 2-3 glass wine nightly     Social History   Substance and Sexual Activity  Drug Use No    Social History   Socioeconomic History   Marital status: Married    Spouse name: Not on file   Number of children: Not on file   Years of education: Not on file   Highest education level: Not on file  Occupational History   Not on file  Tobacco Use   Smoking status: Every Day    Packs/day: 0.50    Years: 45.00    Pack years: 22.50    Types: Cigarettes   Smokeless tobacco: Never  Substance and Sexual Activity   Alcohol use: Yes    Alcohol/week: 28.0 standard drinks    Types: 7 Standard drinks or equivalent, 21 Glasses of wine per week    Comment: 2-3 glass wine nightly   Drug use: No   Sexual activity: Yes  Other Topics Concern   Not on file  Social History Narrative   Not on file   Social Determinants of Health   Financial Resource Strain: Not on file  Food Insecurity: Not on file  Transportation Needs: Not on file  Physical Activity: Not on file  Stress: Not on file  Social Connections: Not on file   Additional Social History:    Pain Medications: see MAR Prescriptions: See MAR Over the Counter: see MAR History of alcohol / drug use?: Yes Longest period of sobriety (when/how long): none reported Negative Consequences of Use: Personal relationships Withdrawal Symptoms: Other (Comment) (anxiety) Name of Substance 1: alcohol 1 - Age of First Use: unknown 1 - Amount (size/oz): 2 bottles wine 1 - Frequency: daily 1 - Duration: on-going 1 - Last Use / Amount: drank a bottle of wine and 4 mixed driks yesterday 1  - Method of Aquiring: store 1- Route of Use: oral  Sleep: Good  Appetite:  Good  Current Medications: Current Facility-Administered Medications  Medication Dose Route Frequency Provider Last Rate Last Admin   acetaminophen (TYLENOL) tablet 650 mg  650 mg Oral Q6H PRN Armandina Stammer I, NP   650 mg at 02/01/21 2117   alum & mag hydroxide-simeth (MAALOX/MYLANTA) 200-200-20 MG/5ML suspension 30 mL  30 mL Oral Q4H PRN Wildon Cuevas I, NP       fluticasone (FLONASE) 50 MCG/ACT nasal spray 2 spray  2 spray Each Nare Daily PRN Armandina Stammer I, NP       furosemide (LASIX) tablet 40  mg  40 mg Oral Daily Armandina Stammer I, NP   40 mg at 02/02/21 3354   hydrOXYzine (ATARAX/VISTARIL) tablet 25 mg  25 mg Oral TID PRN Laveda Abbe, NP   25 mg at 02/01/21 1510   loperamide (IMODIUM) capsule 4 mg  4 mg Oral PRN Massengill, Harrold Donath, MD   4 mg at 02/02/21 1250   losartan (COZAAR) tablet 50 mg  50 mg Oral Daily Armandina Stammer I, NP   50 mg at 02/02/21 5625   magnesium hydroxide (MILK OF MAGNESIA) suspension 30 mL  30 mL Oral Daily PRN Armandina Stammer I, NP       multivitamin with minerals tablet 1 tablet  1 tablet Oral Daily Armandina Stammer I, NP   1 tablet at 02/02/21 0837   naltrexone (DEPADE) tablet 50 mg  50 mg Oral Daily Armandina Stammer I, NP   50 mg at 02/02/21 6389   nicotine (NICODERM CQ - dosed in mg/24 hours) patch 21 mg  21 mg Transdermal Q0600 Armandina Stammer I, NP   21 mg at 02/02/21 0839   sertraline (ZOLOFT) tablet 100 mg  100 mg Oral Daily Massengill, Harrold Donath, MD   100 mg at 02/02/21 3734   thiamine tablet 100 mg  100 mg Oral Daily Armandina Stammer I, NP   100 mg at 02/02/21 2876   traZODone (DESYREL) tablet 100 mg  100 mg Oral QHS Massengill, Harrold Donath, MD   100 mg at 02/01/21 2116   traZODone (DESYREL) tablet 50 mg  50 mg Oral QHS PRN Massengill, Harrold Donath, MD       Lab Results:  No results found for this or any previous visit (from the past 48 hour(s)).  Blood Alcohol level:  Lab Results  Component Value Date    ETH <10 01/26/2021   Metabolic Disorder Labs: Lab Results  Component Value Date   HGBA1C 5.1 01/26/2021   MPG 99.67 01/26/2021   No results found for: PROLACTIN Lab Results  Component Value Date   CHOL 196 01/26/2021   TRIG 127 01/26/2021   HDL 72 01/26/2021   CHOLHDL 2.7 01/26/2021   VLDL 25 01/26/2021   LDLCALC 99 01/26/2021   Physical Findings: AIMS:  , ,  ,  ,    CIWA:  CIWA-Ar Total: 0 COWS:     Musculoskeletal: Strength & Muscle Tone: within normal limits Gait & Station: normal Patient leans: N/A  Psychiatric Specialty Exam:  Presentation  General Appearance: Appropriate for Environment; Casual; Fairly Groomed  Eye Contact:Good  Speech:Normal Rate; Clear and Coherent  Speech Volume:Normal  Handedness:Right  Mood and Affect  Mood:Anxious; Depressed  Affect:Appropriate; Congruent  Thought Process  Thought Processes:Coherent; Goal Directed; Linear  Descriptions of Associations:Intact  Orientation:Full (Time, Place and Person)  Thought Content:Logical  History of Schizophrenia/Schizoaffective disorder:No  Duration of Psychotic Symptoms:Na Hallucinations:Hallucinations: None Description of Auditory Hallucinations: NA  Ideas of Reference:None  Suicidal Thoughts:Suicidal Thoughts: No SI Passive Intent and/or Plan: Without Intent; Without Plan; Without Means to Carry Out; Without Access to Means  Homicidal Thoughts:Homicidal Thoughts: No  Sensorium  Memory:Immediate Good; Recent Good; Remote Good  Judgment:Fair  Insight:Fair  Executive Functions  Concentration:Fair  Attention Span:Fair  Recall:Good  Fund of Knowledge:Good  Language:Good  Psychomotor Activity  Psychomotor Activity:Psychomotor Activity: Normal  Assets  Assets:Communication Skills; Desire for Improvement; Resilience; Social Support  Sleep  Sleep:Sleep: Good Number of Hours of Sleep: 6.75  Physical Exam: Physical Exam Vitals and nursing note reviewed.   HENT:     Head: Normocephalic.  Nose: Nose normal.     Mouth/Throat:     Pharynx: Oropharynx is clear.  Eyes:     Pupils: Pupils are equal, round, and reactive to light.  Cardiovascular:     Rate and Rhythm: Normal rate.     Pulses: Normal pulses.  Pulmonary:     Effort: Pulmonary effort is normal.  Genitourinary:    Comments: Deferred Musculoskeletal:        General: Normal range of motion.     Cervical back: Normal range of motion.  Skin:    General: Skin is warm and dry.  Neurological:     General: No focal deficit present.     Mental Status: She is alert and oriented to person, place, and time.   Review of Systems  Constitutional:  Negative for chills, diaphoresis and fever.  HENT:  Negative for congestion and sore throat.   Eyes:  Negative for blurred vision.  Respiratory:  Negative for cough, shortness of breath and wheezing.   Cardiovascular:  Negative for chest pain and palpitations.  Gastrointestinal:  Negative for abdominal pain, blood in stool, constipation, diarrhea, heartburn, melena, nausea and vomiting.  Genitourinary:  Negative for dysuria.  Musculoskeletal:  Negative for joint pain and myalgias.  Neurological:  Negative for dizziness, tingling, tremors, sensory change, speech change, focal weakness, seizures, loss of consciousness, weakness and headaches.  Endo/Heme/Allergies:  Negative for environmental allergies and polydipsia. Does not bruise/bleed easily.       Allergies: Cefaclor                  Cephalosporins           Psychiatric/Behavioral:  Positive for depression and substance abuse (Hx. alcoholism (chronic)). Negative for hallucinations, memory loss and suicidal ideas. The patient is nervous/anxious. The patient does not have insomnia.   Blood pressure 129/67, pulse (!) 55, temperature 98.1 F (36.7 C), resp. rate 16, height 5' 0.63" (1.54 m), weight 66.7 kg, SpO2 99 %. Body mass index is 28.12 kg/m.  Treatment Plan Summary: Daily contact  with patient to assess and evaluate symptoms and progress in treatment and Medication management.   Continue inpatient hospitalization.  Will continue today 02/02/2021 plan as below except where it is noted.    PLAN: Safety and Monitoring:             -- Voluntary admission to inpatient psychiatric unit for safety, stabilization and treatment             -- Daily contact with patient to assess and evaluate symptoms and progress in treatment             -- Patient's case to be discussed in multi-disciplinary team meeting             -- Observation Level : q15 minute checks             -- Vital signs:  q12 hours             -- Precautions: suicide, elopement, and assault   2. Psychiatric Diagnoses and Treatment:    Alcohol withdrawal symptoms. Discontinued the CIWA detox protocols as already in progress/as recommended.   Alcohol craving. Continue Naltrexone 50 mg po daily (starting tomorrow).   Major depressive disorder.  Initiated Pristiq 50 mg po daily starting 02-03-21). Discontinued Abilify 2 mg po daily to augment antidepressant. Sertraline 75 mg po daily (02-01-21) (completed). Discontinued Continue Sertraline 100 mg po daily (starting on 02-02-21).  Anxiety.  Continue Hydroxyzine 25 mg po  po Q 6 hrs prn. Initiated Hydroxyzine 25 mg po Q 6 hrs prn for hives/itching x 4 doses.    Insomnia.  Initiated Trazodone 100 mg po Q hs (starting tonight 02-01-21). Continue Trazodone 50 mg po Q hs prn.   Other prn medications, continue:  Acetaminophen 650 mg po Q 6 hrs prn for pain/fever. Mylanta 30 mg po Q 4 hrs prn for indigestion.  Imodium 4 mg po once today for loose stools. MOM 30 ml po daily prn for constipation.  Discontinued Zofran-ODT 4 mg po Q 6 hrs prn for N/V.   Other medical issues (resumed): Flonase 2 sprays each nare for allergies. Furosemide 40 mg po daily for HTN/edema. Losartan 50 mg po daily for HTN. Multivitamin 1 tablet po daily for low vitamin. Nicotine  patch 21 mg trans-dermally Q 24 hrs for nicotine withdrawal symptoms. Thiamine 100 mg po daily for low thiamine.    3. Discharge Planning:              -- Social work and case management to assist with discharge planning and identification of hospital follow-up needs prior to discharge             -- Estimated LOS: 5-7 days             -- Discharge Concerns: Need to establish a safety plan; Medication compliance and effectiveness             -- Discharge Goals: Interested in going to a rehab program & will need outpatient referrals for mental health follow-up including medication management/psychotherapy  Armandina Stammer, NP, PMHNP, FNP-BC 02/02/2021, 3:43 PM Patient ID: [name], female   DOB: 1956-02-23, 65 y.o.   MRN: [id number] Patient ID: ZLATA ALCAIDE, female   DOB: 1955/07/12, 65 y.o.   MRN: [id number]

## 2021-02-02 NOTE — [facility] Group Notes (Signed)
Patient attends Psycho-ed group and was engaged as evidence by reciting her goals to music. Educated Patient on self-love and self-care.

## 2021-02-03 DIAGNOSIS — F332 Major depressive disorder, recurrent severe without psychotic features: Secondary | ICD-10-CM | POA: Diagnosis not present

## 2021-02-03 NOTE — Progress Notes (Signed)
Pt stated she felt a little weird earlier in the day due to the new medication, pt stated her energy was low , pt informed to talk to the doctor. Pt stated she was feeling better this evening, pt appeared to be in no distress, pt informed to notify staff if she started feeling worse or bad again.     02/03/21 2300  Psych Admission Type (Psych Patients Only)  Admission Status Voluntary  Psychosocial Assessment  Patient Complaints Anxiety  Eye Contact Fair  Facial Expression Flat  Affect Appropriate to circumstance  Speech Logical/coherent  Interaction Assertive  Motor Activity Other (Comment) (WDL)  Appearance/Hygiene Unremarkable  Behavior Characteristics Cooperative  Mood Anxious  Aggressive Behavior  Effect No apparent injury  Thought Process  Coherency WDL  Content WDL  Delusions None reported or observed  Perception WDL  Hallucination None reported or observed  Judgment Impaired  Confusion None  Danger to Self  Current suicidal ideation? Denies  Danger to Others  Danger to Others None reported or observed

## 2021-02-03 NOTE — Progress Notes (Signed)
Adult Psychoeducational Group Note  Date:  02/03/2021 Time:  11:21 PM  Group Topic/Focus:  Wrap-Up Group:   The focus of this group is to help patients review their daily goal of treatment and discuss progress on daily workbooks.  Participation Level:  Active  Participation Quality:  Appropriate  Affect:  Appropriate  Cognitive:  Appropriate  Insight: Good  Engagement in Group:  Engaged  Modes of Intervention:  Discussion  Additional Comments:  Patient rates her day 9/10 but then it went down to 3/10 d/t the effects of a new medication she started today. Pt attended groups. Pt commented that she does not feel ready to discharge yet. She is concerned about a lack of consistency with the rules on the unit between shifts and also with different staff during the shift. Pt says she is saddened by a lack of empathy from staff.  Tiffany Mueller A Tiffany Mueller 02/03/2021, 11:21 PM

## 2021-02-03 NOTE — [facility] Group Notes (Signed)
.  Psychoeducational Group Note  Date: 02/01/2021 Time: 0900-1000    Goal Setting   Purpose of Group: This group helps to provide patients with the steps of setting a goal that is specific, measurable, attainable, realistic and time specific. A discussion on how we keep ourselves stuck with negative self talk.    Participation Level:  Active  Participation Quality:  Appropriate  Affect:  Appropriate  Cognitive:  Appropriate  Insight:  Improving  Engagement in Group:  Engaged  Additional Comments:  Pt rates her energy at a 7.5. Given 30 positives to write about herself  Dione Housekeeper

## 2021-02-03 NOTE — Group Note (Signed)
LCSW Group Therapy Note  Group Date: 02/03/2021 Start Time: 1005 End Time: 1105   Type of Therapy and Topic:  Group Therapy: Anger Cues and Responses  Participation Level:  Active   Description of Group:   In this group, patients learned how to recognize the physical, cognitive, emotional, and behavioral responses they have to anger-provoking situations.  They identified a recent time they became angry and how they reacted.  They analyzed how their reaction was possibly beneficial and how it was possibly unhelpful.  The group discussed a variety of healthier coping skills that could help with such a situation in the future.  Focus was placed on how helpful it is to recognize the underlying emotions to our anger, because working on those can lead to a more permanent solution as well as our ability to focus on the important rather than the urgent.  Therapeutic Goals: Patients will remember their last incident of anger and how they felt emotionally and physically, what their thoughts were at the time, and how they behaved. Patients will identify how their behavior at that time worked for them, as well as how it worked against them. Patients will explore possible new behaviors to use in future anger situations. Patients will learn that anger itself is normal and cannot be eliminated, and that healthier reactions can assist with resolving conflict rather than worsening situations.  Summary of Patient Progress:  Tiffany Mueller was active during the group. She shared a recent occurrence wherein feeling triggered on the unit led to anger. She stated that yesterday when this happened, she chose to walk away and remain calm.  She talked at length about blaming people in her life for things that have happened, stating that now she realizes she has been an "ass" and a "bitch" and needs to apologize.  CSW gave her the homework of practicing an apology with someone on the unit.  She demonstrated good insight into the  subject matter, was respectful of peers, and participated throughout the entire session.  Therapeutic Modalities:   Cognitive Behavioral Therapy    Lynnell Chad, LCSWA 02/03/2021  1:07 PM

## 2021-02-03 NOTE — Progress Notes (Signed)
[facility] MD Progress Note  02/03/2021 3:46 PM Tiffany Mueller  MRN:  [id number]  Subjective:  Tiffany Mueller reported " doing okay today."  Evaluation: Tiffany Mueller was seen face-to-face.  Observed sitting in dayroom interacting with peers.  She reports attending daily group sessions with active and engaged participation.  Denying suicidal or homicidal ideations.  Denies auditory or visual hallucinations.  Patient was recently started on Pristiq which she reports taking her first dose this morning.  Denied any medication side effects i.e. headaches nausea vomiting or dizziness.  Continues to report cravings for alcohol.  States she was taking Campral at home however she continued to drink while taking the medication.  States she is currently on naltrexone with very minimal effects currently. Reports her longest period of sobriety was 18 months.  Denied history of tremors, history with seizures or hallucinations.  Tiffany Mueller reports her husband continues to be supportive.  Stated ongoing ruminations which is worse at night.  States " I continue to go over in my mind of what was discussed in group and other people situations."  Patient reports that she can be slightly intrusive with offering her opinion. States that she is working on Administrator. Reported a good appetite and stated she is resting " okay."  through the night. Support, encouragement and reassurance was provided.   Principal Problem: Major depressive disorder, recurrent episode, severe (HCC) Diagnosis: Principal Problem:   Major depressive disorder, recurrent episode, severe (HCC) Active Problems:   Alcohol use disorder, severe, dependence (HCC)  Total Time spent with patient: 15 minutes  Past Psychiatric History:   Past Medical History:  Past Medical History:  Diagnosis Date   ADD (attention deficit disorder)    Anastomotic stricture of gastrojejunostomy 2002   EGD dilation   Anxiety    Arthritis    Carpal tunnel syndrome    Depression     Hypertension    Osteoporosis     Past Surgical History:  Procedure Laterality Date   ABDOMINAL HYSTERECTOMY  2007   APPENDECTOMY     CARPAL TUNNEL RELEASE Left 07/04/2015   CARPAL TUNNEL RELEASE Left 07/04/2015   Procedure: LEFT CARPAL TUNNEL RELEASE;  Surgeon: Dominica Severin, MD;  Location: [facility] OR;  Service: Orthopedics;  Laterality: Left;   ESOPHAGOGASTRODUODENOSCOPY (EGD) WITH ESOPHAGEAL DILATION  2002   Dr Randa Evens.  GJ stricture   FRACTURE SURGERY     GASTRIC BYPASS  2001   [facility]   LAPAROSCOPIC CHOLECYSTECTOMY     LAPAROSCOPIC OOPHERECTOMY     LIGAMENT REPAIR Right    "wrist"   OPEN REDUCTION INTERNAL FIXATION (ORIF) DISTAL RADIAL FRACTURE Left 07/04/2015   WITH ALLOGRAFT BONE GRAFTING    OPEN REDUCTION INTERNAL FIXATION (ORIF) DISTAL RADIAL FRACTURE Left 07/04/2015   Procedure: OPEN REDUCTION INTERNAL FIXATION (ORIF) LEFT DISTAL RADIAL FRACTURE WITH ALLOGRAFT BONE GRAFTING;  Surgeon: Dominica Severin, MD;  Location: [facility] OR;  Service: Orthopedics;  Laterality: Left;   STRABISMUS SURGERY Bilateral    "4 on the left; 1 on right"   TUBAL LIGATION     Family History:  Family History  Problem Relation Age of Onset   Alzheimer's disease Mother    Breast cancer Mother    Atrial fibrillation Father    Breast cancer Maternal Grandmother    Family Psychiatric  History:  Social History:  Social History   Substance and Sexual Activity  Alcohol Use Yes   Alcohol/week: 28.0 standard drinks   Types: 7 Standard drinks or equivalent, 21 Glasses of wine per  week   Comment: 2-3 glass wine nightly     Social History   Substance and Sexual Activity  Drug Use No    Social History   Socioeconomic History   Marital status: Married    Spouse name: Not on file   Number of children: Not on file   Years of education: Not on file   Highest education level: Not on file  Occupational History   Not on file  Tobacco Use   Smoking status: Every Day    Packs/day: 0.50    Years:  45.00    Pack years: 22.50    Types: Cigarettes   Smokeless tobacco: Never  Substance and Sexual Activity   Alcohol use: Yes    Alcohol/week: 28.0 standard drinks    Types: 7 Standard drinks or equivalent, 21 Glasses of wine per week    Comment: 2-3 glass wine nightly   Drug use: No   Sexual activity: Yes  Other Topics Concern   Not on file  Social History Narrative   Not on file   Social Determinants of Health   Financial Resource Strain: Not on file  Food Insecurity: Not on file  Transportation Needs: Not on file  Physical Activity: Not on file  Stress: Not on file  Social Connections: Not on file   Additional Social History:    Pain Medications: see MAR Prescriptions: See MAR Over the Counter: see MAR History of alcohol / drug use?: Yes Longest period of sobriety (when/how long): none reported Negative Consequences of Use: Personal relationships Withdrawal Symptoms: Other (Comment) (anxiety) Name of Substance 1: alcohol 1 - Age of First Use: unknown 1 - Amount (size/oz): 2 bottles wine 1 - Frequency: daily 1 - Duration: on-going 1 - Last Use / Amount: drank a bottle of wine and 4 mixed driks yesterday 1 - Method of Aquiring: store 1- Route of Use: oral                  Sleep: Good  Appetite:  Fair  Current Medications: Current Facility-Administered Medications  Medication Dose Route Frequency Provider Last Rate Last Admin   acetaminophen (TYLENOL) tablet 650 mg  650 mg Oral Q6H PRN Armandina Stammer I, NP   650 mg at 02/01/21 2117   alum & mag hydroxide-simeth (MAALOX/MYLANTA) 200-200-20 MG/5ML suspension 30 mL  30 mL Oral Q4H PRN Armandina Stammer I, NP       desvenlafaxine (PRISTIQ) 24 hr tablet 50 mg  50 mg Oral Daily Massengill, Nathan, MD   50 mg at 02/03/21 0818   fluticasone (FLONASE) 50 MCG/ACT nasal spray 2 spray  2 spray Each Nare Daily PRN Armandina Stammer I, NP       furosemide (LASIX) tablet 40 mg  40 mg Oral Daily Nwoko, Nicole Kindred I, NP   40 mg at 02/03/21  0818   hydrOXYzine (ATARAX/VISTARIL) tablet 25 mg  25 mg Oral TID PRN Laveda Abbe, NP   25 mg at 02/02/21 2124   loperamide (IMODIUM) capsule 4 mg  4 mg Oral PRN Massengill, Harrold Donath, MD   4 mg at 02/02/21 1250   losartan (COZAAR) tablet 50 mg  50 mg Oral Daily Nwoko, Nicole Kindred I, NP   50 mg at 02/03/21 0818   magnesium hydroxide (MILK OF MAGNESIA) suspension 30 mL  30 mL Oral Daily PRN Armandina Stammer I, NP       multivitamin with minerals tablet 1 tablet  1 tablet Oral Daily Armandina Stammer I, NP   1  tablet at 02/03/21 0818   naltrexone (DEPADE) tablet 50 mg  50 mg Oral Daily Armandina Stammer I, NP   50 mg at 02/03/21 0818   nicotine (NICODERM CQ - dosed in mg/24 hours) patch 21 mg  21 mg Transdermal Q0600 Armandina Stammer I, NP   21 mg at 02/03/21 4034   thiamine tablet 100 mg  100 mg Oral Daily Armandina Stammer I, NP   100 mg at 02/03/21 0818   traZODone (DESYREL) tablet 100 mg  100 mg Oral QHS Massengill, Harrold Donath, MD   100 mg at 02/02/21 2124   traZODone (DESYREL) tablet 50 mg  50 mg Oral QHS PRN Massengill, Harrold Donath, MD        Lab Results:  Results for orders placed or performed during the hospital encounter of 01/25/21 (from the past 48 hour(s))  Basic metabolic panel     Status: Abnormal   Collection Time: 02/02/21  6:19 PM  Result Value Ref Range   Sodium 137 135 - 145 mmol/L   Potassium 4.2 3.5 - 5.1 mmol/L   Chloride 106 98 - 111 mmol/L   CO2 20 (L) 22 - 32 mmol/L   Glucose, Bld 121 (H) 70 - 99 mg/dL    Comment: Glucose reference range applies only to samples taken after fasting for at least 8 hours.   BUN 15 8 - 23 mg/dL   Creatinine, Ser 7.42 (H) 0.44 - 1.00 mg/dL   Calcium 8.7 (L) 8.9 - 10.3 mg/dL   GFR, Estimated 51 (L) >60 mL/min    Comment: (NOTE) Calculated using the CKD-EPI Creatinine Equation (2021)    Anion gap 11 5 - 15    Comment: Performed at [facility], [address] [place], [place] [address]    Blood Alcohol level:  Lab Results  Component Value  Date   ETH <10 01/26/2021    Metabolic Disorder Labs: Lab Results  Component Value Date   HGBA1C 5.1 01/26/2021   MPG 99.67 01/26/2021   No results found for: PROLACTIN Lab Results  Component Value Date   CHOL 196 01/26/2021   TRIG 127 01/26/2021   HDL 72 01/26/2021   CHOLHDL 2.7 01/26/2021   VLDL 25 01/26/2021   LDLCALC 99 01/26/2021    Physical Findings: AIMS:  , ,  ,  ,    CIWA:  CIWA-Ar Total: 0 COWS:     Musculoskeletal: Strength & Muscle Tone: within normal limits Gait & Station: normal Patient leans: N/A  Psychiatric Specialty Exam:  Presentation  General Appearance: Appropriate for Environment; Casual; Fairly Groomed  Eye Contact:Good  Speech:Normal Rate; Clear and Coherent  Speech Volume:Normal  Handedness:Right   Mood and Affect  Mood:Anxious; Depressed  Affect:Appropriate; Congruent   Thought Process  Thought Processes:Coherent; Goal Directed; Linear  Descriptions of Associations:Intact  Orientation:Full (Time, Place and Person)  Thought Content:Logical  History of Schizophrenia/Schizoaffective disorder:No  Duration of Psychotic Symptoms:No data recorded Hallucinations:Hallucinations: None Description of Auditory Hallucinations: NA  Ideas of Reference:None  Suicidal Thoughts:Suicidal Thoughts: No SI Passive Intent and/or Plan: Without Intent; Without Plan; Without Means to Carry Out; Without Access to Means  Homicidal Thoughts:Homicidal Thoughts: No   Sensorium  Memory:Immediate Good; Recent Good; Remote Good  Judgment:Fair  Insight:Fair   Executive Functions  Concentration:Fair  Attention Span:Fair  Recall:Good  Fund of Knowledge:Good  Language:Good   Psychomotor Activity  Psychomotor Activity:Psychomotor Activity: Normal   Assets  Assets:Communication Skills; Desire for Improvement; Resilience; Social Support   Sleep  Sleep:Sleep: Good Number of  Hours of Sleep: 6.75    Physical Exam: Physical  Exam Vitals reviewed.  Cardiovascular:     Rate and Rhythm: Normal rate and regular rhythm.  Pulmonary:     Effort: Pulmonary effort is normal.  Musculoskeletal:        General: No tenderness.  Neurological:     Mental Status: She is alert and oriented to person, place, and time.  Psychiatric:        Attention and Perception: Attention normal.        Mood and Affect: Mood normal.        Speech: Speech normal.        Behavior: Behavior normal.        Thought Content: Thought content normal.        Cognition and Memory: Cognition normal.        Judgment: Judgment normal.   Review of Systems  Psychiatric/Behavioral:  Positive for depression and substance abuse. Negative for suicidal ideas.   All other systems reviewed and are negative. Blood pressure 139/69, pulse 73, temperature 98.2 F (36.8 C), temperature source Oral, resp. rate 18, height 5' 0.63" (1.54 m), weight 66.7 kg, SpO2 100 %. Body mass index is 28.12 kg/m.   Treatment Plan Summary: Daily contact with patient to assess and evaluate symptoms and progress in treatment and Medication management  Continue with current treatment plan on 02/03/2021 as listed below except where noted  Major depression disorder:  Alcohol Use Disorder:  Continue Pristiq 50 mg p.o. daily Continue naltrexone 50 mg p.o. daily Continue hydroxyzine 25 mg p.o. as needed 3 times daily Continue trazodone 100 mg p.o. nightly  CSW to continue working on discharge disposition Patient encouraged to participate within the therapeutic milieu  Oneta Rack, NP 02/03/2021, 3:46 PM

## 2021-02-03 NOTE — Progress Notes (Signed)
DAR NOTE: Patient presents with calm affect and mood.  Denies suicidal thoughts, pain, auditory and visual hallucinations.  Described energy level as low with good concentration.  Rates depression at 0, hopelessness at 0, and anxiety at 6.  Maintained on routine safety checks.  Medications given as prescribed.  Support and encouragement offered as needed.  Attended group and participated.  States goal for today is "coping."  Patient observed socializing with peers in the dayroom.  Offered no complaint.  Patient is safe on and off the unit.

## 2021-02-03 NOTE — [facility] Group Notes (Signed)
.  Psychoeducational Group Note    Date:02/03/2021 Time: 1300-1400    Purpose of Group: . The group focus' on teaching patients on how to identify their needs and how Life Skills:  A group where two lists are made. What people need and what are things that we do that are healthy. The lists are developed by the patients and it is explained that we often do the actions that are not healthy to get our list of needs met.  to develop the coping skills needed to get their needs met  Participation Level:  Active  Participation Quality:  Appropriate  Affect:  Appropriate  Cognitive:  Oriented  Insight:  Improving  Engagement in Group:  Engaged  Additional Comments:  Pt was quite engaged in the group. Rates her energy at a 8/10   Bryson Dames A

## 2021-02-03 NOTE — [facility] Group Notes (Signed)
Psychoeducational Group Note  Date: 02-03-21 Time:  1300  Group Topic/Focus:  Making Healthy Choices:   The focus of this group is to help patients identify negative/unhealthy choices they were using prior to admission and identify positive/healthier coping strategies to replace them upon discharge.In this group.   Participation Level:  Active  Participation Quality:  Appropriate  Affect:  Appropriate  Cognitive:  Oriented  Insight:  Improving  Engagement in Group:  Engaged  Additional Comments:.Attending the group and participating fuly  Sharee Pimple, Delorse Limber

## 2021-02-04 DIAGNOSIS — F332 Major depressive disorder, recurrent severe without psychotic features: Secondary | ICD-10-CM | POA: Diagnosis not present

## 2021-02-04 MED ORDER — QUETIAPINE FUMARATE 25 MG PO TABS
25.0000 mg | ORAL_TABLET | Freq: Every day | ORAL | Status: DC
  Administered 2021-02-04: 25 mg via ORAL
  Filled 2021-02-04 (×3): qty 1

## 2021-02-04 MED ORDER — CLONIDINE HCL 0.1 MG PO TABS
0.1000 mg | ORAL_TABLET | Freq: Once | ORAL | Status: AC
  Administered 2021-02-04: 0.1 mg via ORAL
  Filled 2021-02-04 (×2): qty 1

## 2021-02-04 NOTE — [facility] Group Notes (Signed)
participated and contributed to wrap up group 

## 2021-02-04 NOTE — Group Note (Signed)
Group Topic: Goal Setting  Group Date: 02/04/2021 Start Time: 0900 End Time: 1000 Facilitators: Guss Bunde  Department[facility]  Number of Participants: 6  Group Focus: goals/reality orientation Treatment Modality:  Leisure Development Interventions utilized were support Purpose: orientation of unit and goal setting  Name: Tiffany Mueller Records Date of Birth: 1955-09-30  MR: [id number]    Level of Participation: moderate Quality of Participation: engaged Interactions with others: appropriate Mood/Affect: appropriate Triggers (if applicable): N/A Cognition: fearful Progress: Moderate Response: Pt is feaful to return home due to expectations. Plan: patient will be encouraged to work on goals.  Patients Problems:  Patient Active Problem List   Diagnosis Date Noted   Major depressive disorder, recurrent episode, severe (HCC) 01/26/2021   Alcohol use disorder, severe, dependence (HCC) 01/25/2021   Spinal stenosis of lumbar region 10/13/2017   Lumbar radiculopathy 07/11/2017   Degeneration of lumbar intervertebral disc 06/16/2017   Low back pain 06/16/2017   Osteoporosis 06/16/2017   Distal radius fracture, left 07/04/2015   Ileitis 11/08/2014   History of gastric bypass 2001 11/08/2014   Abdominal pain 11/08/2014   Never has had a colonoscopy 11/08/2014   Hypertension    Essential hypertension

## 2021-02-04 NOTE — Progress Notes (Addendum)
[facility] MD Progress Note  02/04/2021 1:10 PM ROBERTINE KIPPER  MRN:  [id number]  Subjective:  Zarai reported " I am feeling little paranoid and  I am scared to return home this early in my recovery."  Evaluation:Anayely and evaluated face-to-face.  She continues to deny suicidal or homicidal ideations.  Denies auditory or visual hallucinations.  Patient was recently started on Pristiq 50 mg which she reports taking and tolerating well.  Does reports mild paranoia and ongoing ruminations.  Rates her depression 3 out of 10 with 10 being the worst.  Reports ongoing rumination and symptoms of worries. Charted CIWA;s 3-4.  Reports mild craving for alcohol.  States her mood is improving day by day.    Reports she has been attending daily group session with active and engaged participation.  Patient states " I am learning a lot about myself and I continue to analyze process everything. "  Patient has requested medication to help with sleeping issues.  Case staffed with attending psychiatrist Mason Jim.  Will recommend Seroquel 25 mg p.o. nightly. ( Consider titration)  Patient was receptive to plan.  Support ,encouragement and reassurance was provided.  Principal Problem: Major depressive disorder, recurrent episode, severe (HCC) Diagnosis: Principal Problem:   Major depressive disorder, recurrent episode, severe (HCC) Active Problems:   Alcohol use disorder, severe, dependence (HCC)  Total Time spent with patient: 15 minutes  Past Psychiatric History:   Past Medical History:  Past Medical History:  Diagnosis Date   ADD (attention deficit disorder)    Anastomotic stricture of gastrojejunostomy 2002   EGD dilation   Anxiety    Arthritis    Carpal tunnel syndrome    Depression    Hypertension    Osteoporosis     Past Surgical History:  Procedure Laterality Date   ABDOMINAL HYSTERECTOMY  2007   APPENDECTOMY     CARPAL TUNNEL RELEASE Left 07/04/2015   CARPAL TUNNEL RELEASE Left 07/04/2015    Procedure: LEFT CARPAL TUNNEL RELEASE;  Surgeon: Dominica Severin, MD;  Location: [facility] OR;  Service: Orthopedics;  Laterality: Left;   ESOPHAGOGASTRODUODENOSCOPY (EGD) WITH ESOPHAGEAL DILATION  2002   Dr Randa Evens.  GJ stricture   FRACTURE SURGERY     GASTRIC BYPASS  2001   [facility]   LAPAROSCOPIC CHOLECYSTECTOMY     LAPAROSCOPIC OOPHERECTOMY     LIGAMENT REPAIR Right    "wrist"   OPEN REDUCTION INTERNAL FIXATION (ORIF) DISTAL RADIAL FRACTURE Left 07/04/2015   WITH ALLOGRAFT BONE GRAFTING    OPEN REDUCTION INTERNAL FIXATION (ORIF) DISTAL RADIAL FRACTURE Left 07/04/2015   Procedure: OPEN REDUCTION INTERNAL FIXATION (ORIF) LEFT DISTAL RADIAL FRACTURE WITH ALLOGRAFT BONE GRAFTING;  Surgeon: Dominica Severin, MD;  Location: [facility] OR;  Service: Orthopedics;  Laterality: Left;   STRABISMUS SURGERY Bilateral    "4 on the left; 1 on right"   TUBAL LIGATION     Family History:  Family History  Problem Relation Age of Onset   Alzheimer's disease Mother    Breast cancer Mother    Atrial fibrillation Father    Breast cancer Maternal Grandmother    Family Psychiatric  History:  Social History:  Social History   Substance and Sexual Activity  Alcohol Use Yes   Alcohol/week: 28.0 standard drinks   Types: 7 Standard drinks or equivalent, 21 Glasses of wine per week   Comment: 2-3 glass wine nightly     Social History   Substance and Sexual Activity  Drug Use No    Social History  Socioeconomic History   Marital status: Married    Spouse name: Not on file   Number of children: Not on file   Years of education: Not on file   Highest education level: Not on file  Occupational History   Not on file  Tobacco Use   Smoking status: Every Day    Packs/day: 0.50    Years: 45.00    Pack years: 22.50    Types: Cigarettes   Smokeless tobacco: Never  Substance and Sexual Activity   Alcohol use: Yes    Alcohol/week: 28.0 standard drinks    Types: 7 Standard drinks or equivalent, 21 Glasses of  wine per week    Comment: 2-3 glass wine nightly   Drug use: No   Sexual activity: Yes  Other Topics Concern   Not on file  Social History Narrative   Not on file   Social Determinants of Health   Financial Resource Strain: Not on file  Food Insecurity: Not on file  Transportation Needs: Not on file  Physical Activity: Not on file  Stress: Not on file  Social Connections: Not on file   Additional Social History:    Pain Medications: see MAR Prescriptions: See MAR Over the Counter: see MAR History of alcohol / drug use?: Yes Longest period of sobriety (when/how long): none reported Negative Consequences of Use: Personal relationships Withdrawal Symptoms: Other (Comment) (anxiety) Name of Substance 1: alcohol 1 - Age of First Use: unknown 1 - Amount (size/oz): 2 bottles wine 1 - Frequency: daily 1 - Duration: on-going 1 - Last Use / Amount: drank a bottle of wine and 4 mixed driks yesterday 1 - Method of Aquiring: store 1- Route of Use: oral                  Sleep: Good  Appetite:  Fair  Current Medications: Current Facility-Administered Medications  Medication Dose Route Frequency Provider Last Rate Last Admin   acetaminophen (TYLENOL) tablet 650 mg  650 mg Oral Q6H PRN Armandina Stammer I, NP   650 mg at 02/01/21 2117   alum & mag hydroxide-simeth (MAALOX/MYLANTA) 200-200-20 MG/5ML suspension 30 mL  30 mL Oral Q4H PRN Armandina Stammer I, NP       desvenlafaxine (PRISTIQ) 24 hr tablet 50 mg  50 mg Oral Daily Massengill, Nathan, MD   50 mg at 02/04/21 0811   fluticasone (FLONASE) 50 MCG/ACT nasal spray 2 spray  2 spray Each Nare Daily PRN Armandina Stammer I, NP       furosemide (LASIX) tablet 40 mg  40 mg Oral Daily Nwoko, Nicole Kindred I, NP   40 mg at 02/04/21 7408   hydrOXYzine (ATARAX/VISTARIL) tablet 25 mg  25 mg Oral TID PRN Laveda Abbe, NP   25 mg at 02/03/21 2115   loperamide (IMODIUM) capsule 4 mg  4 mg Oral PRN Massengill, Harrold Donath, MD   4 mg at 02/02/21 1250    losartan (COZAAR) tablet 50 mg  50 mg Oral Daily Armandina Stammer I, NP   50 mg at 02/04/21 1448   magnesium hydroxide (MILK OF MAGNESIA) suspension 30 mL  30 mL Oral Daily PRN Armandina Stammer I, NP       multivitamin with minerals tablet 1 tablet  1 tablet Oral Daily Armandina Stammer I, NP   1 tablet at 02/04/21 0811   naltrexone (DEPADE) tablet 50 mg  50 mg Oral Daily Armandina Stammer I, NP   50 mg at 02/04/21 1856   nicotine (NICODERM  CQ - dosed in mg/24 hours) patch 21 mg  21 mg Transdermal Q0600 Armandina Stammer I, NP   21 mg at 02/03/21 9509   thiamine tablet 100 mg  100 mg Oral Daily Armandina Stammer I, NP   100 mg at 02/04/21 3267   traZODone (DESYREL) tablet 100 mg  100 mg Oral QHS Massengill, Harrold Donath, MD   100 mg at 02/03/21 2115   traZODone (DESYREL) tablet 50 mg  50 mg Oral QHS PRN Massengill, Harrold Donath, MD        Lab Results:  Results for orders placed or performed during the hospital encounter of 01/25/21 (from the past 48 hour(s))  Basic metabolic panel     Status: Abnormal   Collection Time: 02/02/21  6:19 PM  Result Value Ref Range   Sodium 137 135 - 145 mmol/L   Potassium 4.2 3.5 - 5.1 mmol/L   Chloride 106 98 - 111 mmol/L   CO2 20 (L) 22 - 32 mmol/L   Glucose, Bld 121 (H) 70 - 99 mg/dL    Comment: Glucose reference range applies only to samples taken after fasting for at least 8 hours.   BUN 15 8 - 23 mg/dL   Creatinine, Ser 1.24 (H) 0.44 - 1.00 mg/dL   Calcium 8.7 (L) 8.9 - 10.3 mg/dL   GFR, Estimated 51 (L) >60 mL/min    Comment: (NOTE) Calculated using the CKD-EPI Creatinine Equation (2021)    Anion gap 11 5 - 15    Comment: Performed at [facility], [address] [place], [place] [address]    Blood Alcohol level:  Lab Results  Component Value Date   ETH <10 01/26/2021    Metabolic Disorder Labs: Lab Results  Component Value Date   HGBA1C 5.1 01/26/2021   MPG 99.67 01/26/2021   No results found for: PROLACTIN Lab Results  Component Value Date   CHOL 196  01/26/2021   TRIG 127 01/26/2021   HDL 72 01/26/2021   CHOLHDL 2.7 01/26/2021   VLDL 25 01/26/2021   LDLCALC 99 01/26/2021    Physical Findings: AIMS:  , ,  ,  ,    CIWA:  CIWA-Ar Total: 0 COWS:     Musculoskeletal: Strength & Muscle Tone: within normal limits Gait & Station: normal Patient leans: N/A  Psychiatric Specialty Exam:  Presentation  General Appearance: Appropriate for Environment; Casual; Fairly Groomed  Eye Contact:Good  Speech:Normal Rate; Clear and Coherent  Speech Volume:Normal  Handedness:Right   Mood and Affect  Mood:Anxious; Depressed  Affect:Appropriate; Congruent   Thought Process  Thought Processes:Coherent; Goal Directed; Linear  Descriptions of Associations:Intact  Orientation:Full (Time, Place and Person)  Thought Content:report of mild paranoia and ruminations  History of Schizophrenia/Schizoaffective disorder:No  Duration of Psychotic Symptoms:No data recorded Hallucinations:No data recorded  Ideas of Reference:None  Suicidal Thoughts:Denied  Homicidal Thoughts:Denied   Sensorium  Memory:Immediate Good; Recent Good; Remote Good  Judgment:Fair  Insight:Fair   Executive Functions  Concentration:Fair  Attention Span:Fair  Recall:Good  Fund of Knowledge:Good  Language:Good   Psychomotor Activity  Psychomotor Activity:Normal   Assets  Assets:Communication Skills; Desire for Improvement; Resilience; Social Support   Sleep  Sleep:6.75 hours   Physical Exam Vitals reviewed.  Cardiovascular:     Rate and Rhythm: Normal rate and regular rhythm.  Pulmonary:     Effort: Pulmonary effort is normal.  Musculoskeletal:        General: No tenderness.  Neurological:     Mental Status: She is alert and oriented  to person, place, and time.  Psychiatric:        Attention and Perception: Attention normal.        Mood and Affect: Mood normal.        Speech: Speech normal.        Behavior: Behavior normal.         Thought Content: Thought content normal.        Cognition and Memory: Cognition normal.        Judgment: Judgment normal.   Review of Systems  Psychiatric/Behavioral:  Positive for depression and substance abuse. Negative for suicidal ideas.   All other systems reviewed and are negative. Blood pressure 140/83, pulse 72, temperature 97.9 F (36.6 C), temperature source Oral, resp. rate 18, height 5' 0.63" (1.54 m), weight 66.7 kg, SpO2 100 %. Body mass index is 28.12 kg/m.   Treatment Plan Summary: Daily contact with patient to assess and evaluate symptoms and progress in treatment and Medication management  Continue with current treatment plan on 02/04/2021 as listed below except where noted  Major depression disorder:  Alcohol Use Disorder:  Continue Pristiq 50 mg p.o. daily Continue naltrexone 50 mg p.o. daily Continue hydroxyzine 25 mg p.o. as needed 3 times daily Discontinue trazodone 100 mg p.o. nightly Initiated Seroquel 25 mg p.o. nightly for help with sleep, paranoia and ruminations (will check EKG for QTC monitoring with start of this medication)  CSW to continue working on discharge disposition Patient encouraged to participate within the therapeutic milieu  Oneta Rack, NP 02/04/2021, 1:10 PM  I have reviewed the patient's chart and discussed the case with the APP. I agree with the assessment and plan of care as documented in the APP's note.  Bartholomew Crews, MD, Celene Skeen

## 2021-02-04 NOTE — Group Note (Signed)
LCSW Group Therapy Note   Group Date: 02/04/2021 Start Time: 1000 End Time: 1100   Type of Therapy and Topic:  Group Therapy: Boundaries  Participation Level:  Active  Description of Group: This group will address the use of boundaries in their personal lives. Patients will explore why boundaries are important, the difference between healthy and unhealthy boundaries, and negative and postive outcomes of different boundaries and will look at how boundaries can be crossed.  Patients will be encouraged to identify current boundaries in their own lives and identify what kind of boundary is being set. Facilitators will guide patients in utilizing problem-solving interventions to address and correct types boundaries being used and to address when no boundary is being used. Understanding and applying boundaries will be explored and addressed for obtaining and maintaining a balanced life. Patients will be encouraged to explore ways to assertively make their boundaries and needs known to significant others in their lives, using other group members and facilitator for role play, support, and feedback.  Therapeutic Goals:  1.  Patient will identify areas in their life where setting clear boundaries could be  used to improve their life.  2.  Patient will identify signs/triggers that a boundary is not being respected. 3.  Patient will identify two ways to set boundaries in order to achieve balance in  their lives: 4.  Patient will demonstrate ability to communicate their needs and set boundaries  through discussion and/or role plays  Summary of Patient Progress:  Tiffany Mueller was fully present/active throughout the session and proved open to feedback from CSW and peers. Patient demonstrated growing insight into the subject matter, was respectful of peers, and was present throughout most of the session although she did leave once for a few minutes.  She asked a number of questions, specifically how to have  boundaries with her 66yo son when she is wanting to be strict with him and her husband just wants to give in to him.    Therapeutic Modalities:   Cognitive Behavioral Therapy Solution-Focused Therapy  Lynnell Chad, LCSWA 02/04/2021  3:14 PM

## 2021-02-04 NOTE — [facility] Group Notes (Signed)
[facility] Group Notes:  (Nursing/MHT/Case Management/Adjunct)  Date:  02/04/2021  Time:  5:08 PM  Type of Therapy:  Music Therapy  Participation Level:  Active  Participation Quality:  Appropriate  Affect:  Appropriate  Cognitive:  Appropriate  Insight:  Appropriate  Engagement in Group:  Engaged  Modes of Intervention:  Activity  Summary of Progress/Problems: Patient did not engaged in the sin-a-long but clapped her hands, laughed and enjoyed seeing and hearing  her peers sing.  Reymundo Poll 02/04/2021, 5:08 PM

## 2021-02-05 MED ORDER — QUETIAPINE FUMARATE 50 MG PO TABS
50.0000 mg | ORAL_TABLET | Freq: Every day | ORAL | Status: DC
  Administered 2021-02-05: 50 mg via ORAL
  Filled 2021-02-05 (×3): qty 1
  Filled 2021-02-05: qty 7

## 2021-02-05 MED ORDER — VENLAFAXINE HCL ER 75 MG PO CP24
75.0000 mg | ORAL_CAPSULE | Freq: Every day | ORAL | Status: AC
  Administered 2021-02-06: 75 mg via ORAL
  Filled 2021-02-05: qty 1

## 2021-02-05 NOTE — Group Note (Signed)
LCSW Group Therapy Note   Group Date: 02/05/2021 Start Time: 1300 End Time: 1345  LCSW Group Therapy Note  Type of Therapy and Topic:  Group Therapy:  Fear  Participation Level:  Active  Description of Group: In this process group, patients discussed things that make feel them feel nervous or scared.  Patients discussed what they think about when they feel this emotions.  Patients were given the opportunity to share openly and support each others.  The group discussed where they feel their fears in their body.  Patients were encouraged to identify new coping mechanisms to use when fears arrise.  Therapeutic Goals Patient will verbalize fears and what thoughts occur when they feel this emotion. Patients will verbalize where on their body they feel fear. Patients will explore coping mechanisms they can use in the future when fear arises.   Summary of Patient Progress: During introductions pt shared her name and stated her fear about discharge was socializing again and money. Pt participated in group appropriately and was active during discussion.    Therapeutic Modalities Cognitive Behavioral Therapy Motivational Interviewing  Chrys Racer 02/05/2021  1:52 PM

## 2021-02-05 NOTE — Group Note (Signed)
Occupational Therapy Group Note  Group Topic:Feelings Management  Group Date: 02/05/2021 Start Time: 1400 End Time: 1445 Facilitators: Zoraida Havrilla, OT/L    Group Description: Group encouraged increased participation and engagement through discussion focused on STRENGTHS. Patients were encouraged to fill out a worksheet to structure discussion, identifying ten strengths that they currently have. Patients were then instructed to utilize various symbols to identify which strengths help them in their relationships, school/profession, and leisure participation. Discussion also focused on identifying one strength they do not currently have but would like. Discussion within the group followed with patients sharing their responses and highlighting their own personal strengths.   Therapeutic Goals: Identify strengths vs weaknesses Discuss and identify ways we can highlight our strengths     Participation Level: Did not attend   Plan: Continue to engage patient in OT groups 2 - 3x/week.  02/05/2021  Jerimah Witucki, OT/L 

## 2021-02-05 NOTE — Progress Notes (Signed)
D:  She reported her day over all was a 6/10 (10 the best).  She rated her anxiety as 6/10 and depression 0/10 (10 the worst).  She stated that a peer reminds me of my son and that has increased his anxiety.  Her BP was elevated this evening, confirmed by manual cuff.  One time order obtained for clonidine with good relief.   A:  1:1 with RN for support and encouragement.  Medications given as ordered.  Q 15 minute checks maintained for safety.  Encouraged participation in group and unit activities.   R:  She is currently resting with her eyes closed and appears to be asleep.  She remains safe on the unit.  We will continue to monitor the progress towards her goals.    02/04/21 2215  Psych Admission Type (Psych Patients Only)  Admission Status Voluntary  Psychosocial Assessment  Patient Complaints Anxiety  Eye Contact Fair  Facial Expression Flat  Affect Appropriate to circumstance  Speech Logical/coherent  Interaction Assertive  Motor Activity Other (Comment) (unremarkable)  Appearance/Hygiene Unremarkable  Behavior Characteristics Cooperative;Appropriate to situation  Mood Anxious  Thought Process  Coherency WDL  Content WDL  Delusions None reported or observed  Perception WDL  Hallucination None reported or observed  Judgment Impaired  Confusion None  Danger to Self  Current suicidal ideation? Denies  Danger to Others  Danger to Others None reported or observed

## 2021-02-05 NOTE — [facility] Group Notes (Signed)
Pt did not attend group today.

## 2021-02-05 NOTE — Progress Notes (Signed)
[facility] MD Progress Note  02/05/2021 1:51 PM Tiffany Mueller  MRN:  [id number] Subjective:    65 year old female with past psychiatric history of major depressive disorder and alcohol use disorder was admitted to the psychiatric unit for safety and evaluation and treatment of worsening depression and suicidal thoughts.    Patient case was discussed in interdisciplinary team this morning.  During the interview today, the patient reports that her mood is more anxious, on edge.  She reports feeling sad and dysphoric.  There is some mild psychomotor agitation occurring.  She reports that sleep is better, more restful, with the Seroquel.  Appetite is unchanged.  Denies having suicidal thoughts.  She reports feeling more anxious and on edge.  She reports "feeling paranoid" but this seems to be more secondary to anxiety.  She reports that someone brought up during group therapy, that others "put on smiles when they are around their families for holidays "and wonders if her family does that to her as well.  She denies AVH.  Denies thought control or thought insertion.  Denies ideas of reference.  Denies other people are after her or following her.  She reports feeling safe on the unit.  Patient attributes the increased anxiety and "paranoid" feelings to the Pristiq.  She is more activated since switching from Zoloft to [employer].  Patient agreeable to increasing Seroquel and transitioning from Pristiq to venlafaxine.  Patient did have venlafaxine for many years in the past.  Zoloft caused her to feel numb and hollow.  Patient been on antidepressants for many years, without reported affective switch.  We will monitor for any hypomanic or manic switch.  The patient has had profound depression for some time, leading to current hospitalization, and therefore requires antidepressant medication for treatment of the symptoms, unless it is determined, after further evaluation, that the patient is having an affective switch  secondary to antidepressant medication.    Principal Problem: Major depressive disorder, recurrent episode, severe (HCC) Diagnosis: Principal Problem:   Major depressive disorder, recurrent episode, severe (HCC) Active Problems:   Alcohol use disorder, severe, dependence (HCC)  Total Time spent with patient: 20 minutes  Past Psychiatric History: See H&P  Past Medical History:  Past Medical History:  Diagnosis Date   ADD (attention deficit disorder)    Anastomotic stricture of gastrojejunostomy 2002   EGD dilation   Anxiety    Arthritis    Carpal tunnel syndrome    Depression    Hypertension    Osteoporosis     Past Surgical History:  Procedure Laterality Date   ABDOMINAL HYSTERECTOMY  2007   APPENDECTOMY     CARPAL TUNNEL RELEASE Left 07/04/2015   CARPAL TUNNEL RELEASE Left 07/04/2015   Procedure: LEFT CARPAL TUNNEL RELEASE;  Surgeon: Dominica Severin, MD;  Location: [facility] OR;  Service: Orthopedics;  Laterality: Left;   ESOPHAGOGASTRODUODENOSCOPY (EGD) WITH ESOPHAGEAL DILATION  2002   Dr Randa Evens.  GJ stricture   FRACTURE SURGERY     GASTRIC BYPASS  2001   [facility]   LAPAROSCOPIC CHOLECYSTECTOMY     LAPAROSCOPIC OOPHERECTOMY     LIGAMENT REPAIR Right    "wrist"   OPEN REDUCTION INTERNAL FIXATION (ORIF) DISTAL RADIAL FRACTURE Left 07/04/2015   WITH ALLOGRAFT BONE GRAFTING    OPEN REDUCTION INTERNAL FIXATION (ORIF) DISTAL RADIAL FRACTURE Left 07/04/2015   Procedure: OPEN REDUCTION INTERNAL FIXATION (ORIF) LEFT DISTAL RADIAL FRACTURE WITH ALLOGRAFT BONE GRAFTING;  Surgeon: Dominica Severin, MD;  Location: [facility] OR;  Service: Orthopedics;  Laterality: Left;   STRABISMUS SURGERY Bilateral    "4 on the left; 1 on right"   TUBAL LIGATION     Family History:  Family History  Problem Relation Age of Onset   Alzheimer's disease Mother    Breast cancer Mother    Atrial fibrillation Father    Breast cancer Maternal Grandmother    Family Psychiatric  History: See H&P Social History:  See H&P Social History   Substance and Sexual Activity  Alcohol Use Yes   Alcohol/week: 28.0 standard drinks   Types: 7 Standard drinks or equivalent, 21 Glasses of wine per week   Comment: 2-3 glass wine nightly     Social History   Substance and Sexual Activity  Drug Use No    Social History   Socioeconomic History   Marital status: Married    Spouse name: Not on file   Number of children: Not on file   Years of education: Not on file   Highest education level: Not on file  Occupational History   Not on file  Tobacco Use   Smoking status: Every Day    Packs/day: 0.50    Years: 45.00    Pack years: 22.50    Types: Cigarettes   Smokeless tobacco: Never  Substance and Sexual Activity   Alcohol use: Yes    Alcohol/week: 28.0 standard drinks    Types: 7 Standard drinks or equivalent, 21 Glasses of wine per week    Comment: 2-3 glass wine nightly   Drug use: No   Sexual activity: Yes  Other Topics Concern   Not on file  Social History Narrative   Not on file   Social Determinants of Health   Financial Resource Strain: Not on file  Food Insecurity: Not on file  Transportation Needs: Not on file  Physical Activity: Not on file  Stress: Not on file  Social Connections: Not on file   Additional Social History:    Pain Medications: see MAR Prescriptions: See MAR Over the Counter: see MAR History of alcohol / drug use?: Yes Longest period of sobriety (when/how long): none reported Negative Consequences of Use: Personal relationships Withdrawal Symptoms: Other (Comment) (anxiety) Name of Substance 1: alcohol 1 - Age of First Use: unknown 1 - Amount (size/oz): 2 bottles wine 1 - Frequency: daily 1 - Duration: on-going 1 - Last Use / Amount: drank a bottle of wine and 4 mixed driks yesterday 1 - Method of Aquiring: store 1- Route of Use: oral                  Sleep: Good  Appetite:  Fair  Current Medications: Current Facility-Administered  Medications  Medication Dose Route Frequency Provider Last Rate Last Admin   acetaminophen (TYLENOL) tablet 650 mg  650 mg Oral Q6H PRN Armandina Stammer I, NP   650 mg at 02/01/21 2117   alum & mag hydroxide-simeth (MAALOX/MYLANTA) 200-200-20 MG/5ML suspension 30 mL  30 mL Oral Q4H PRN Nwoko, Agnes I, NP       fluticasone (FLONASE) 50 MCG/ACT nasal spray 2 spray  2 spray Each Nare Daily PRN Nwoko, Agnes I, NP       furosemide (LASIX) tablet 40 mg  40 mg Oral Daily Nwoko, Nicole Kindred I, NP   40 mg at 02/05/21 0824   hydrOXYzine (ATARAX/VISTARIL) tablet 25 mg  25 mg Oral TID PRN Laveda Abbe, NP   25 mg at 02/05/21 0824   loperamide (IMODIUM) capsule 4 mg  4  mg Oral PRN Phineas Inches, MD   4 mg at 02/04/21 2130   losartan (COZAAR) tablet 50 mg  50 mg Oral Daily Armandina Stammer I, NP   50 mg at 02/05/21 3329   magnesium hydroxide (MILK OF MAGNESIA) suspension 30 mL  30 mL Oral Daily PRN Armandina Stammer I, NP       multivitamin with minerals tablet 1 tablet  1 tablet Oral Daily Armandina Stammer I, NP   1 tablet at 02/05/21 0824   naltrexone (DEPADE) tablet 50 mg  50 mg Oral Daily Armandina Stammer I, NP   50 mg at 02/05/21 5188   nicotine (NICODERM CQ - dosed in mg/24 hours) patch 21 mg  21 mg Transdermal Q0600 Armandina Stammer I, NP   21 mg at 02/05/21 4166   QUEtiapine (SEROQUEL) tablet 50 mg  50 mg Oral QHS Maude Gloor, Harrold Donath, MD       thiamine tablet 100 mg  100 mg Oral Daily Armandina Stammer I, NP   100 mg at 02/05/21 0630   traZODone (DESYREL) tablet 50 mg  50 mg Oral QHS PRN Phineas Inches, MD   50 mg at 02/04/21 2129   [START ON 02/06/2021] venlafaxine XR (EFFEXOR-XR) 24 hr capsule 75 mg  75 mg Oral Q breakfast Maurizio Geno, MD        Lab Results: No results found for this or any previous visit (from the past 48 hour(s)).  Blood Alcohol level:  Lab Results  Component Value Date   ETH <10 01/26/2021    Metabolic Disorder Labs: Lab Results  Component Value Date   HGBA1C 5.1 01/26/2021   MPG  99.67 01/26/2021   No results found for: PROLACTIN Lab Results  Component Value Date   CHOL 196 01/26/2021   TRIG 127 01/26/2021   HDL 72 01/26/2021   CHOLHDL 2.7 01/26/2021   VLDL 25 01/26/2021   LDLCALC 99 01/26/2021    Physical Findings: AIMS:  , ,  ,  ,    CIWA:  CIWA-Ar Total: 1 COWS:     Musculoskeletal: Strength & Muscle Tone: within normal limits Gait & Station: normal Patient leans: N/A  Psychiatric Specialty Exam:  Presentation  General Appearance: Appropriate for Environment; Casual; Fairly Groomed  Eye Contact:Good  Speech:Pressured  Speech Volume:Normal  Handedness:Right   Mood and Affect  Mood:Anxious; Dysphoric; Irritable; Labile  Affect:Labile   Thought Process  Thought Processes:Linear  Descriptions of Associations:Intact  Orientation:Full (Time, Place and Person)  Thought Content:Paranoid Ideation  History of Schizophrenia/Schizoaffective disorder:No  Duration of Psychotic Symptoms:No data recorded Hallucinations:Hallucinations: None  Ideas of Reference:None  Suicidal Thoughts:Suicidal Thoughts: No  Homicidal Thoughts:Homicidal Thoughts: No   Sensorium  Memory:Immediate Fair; Remote Fair; Recent Fair  Judgment:Fair  Insight:Fair   Executive Functions  Concentration:Fair  Attention Span:Fair  Recall:Good  Fund of Knowledge:Good  Language:Good   Psychomotor Activity  Psychomotor Activity:Psychomotor Activity: Increased   Assets  Assets:Communication Skills; Desire for Improvement; Resilience; Social Support   Sleep  Sleep:Sleep: Good    Physical Exam: Physical Exam Vitals reviewed.  Constitutional:      General: She is not in acute distress.    Appearance: She is normal weight. She is not ill-appearing or toxic-appearing.  HENT:     Nose: No congestion or rhinorrhea.  Pulmonary:     Effort: Pulmonary effort is normal. No respiratory distress.  Neurological:     Mental Status: She is alert.      Motor: No weakness.   Review of Systems  Psychiatric/Behavioral:  Positive for depression. Negative for hallucinations and suicidal ideas. The patient is nervous/anxious. The patient does not have insomnia.   All other systems reviewed and are negative. Blood pressure 112/68, pulse 83, temperature 98.7 F (37.1 C), temperature source Oral, resp. rate 18, height 5' 0.63" (1.54 m), weight 66.7 kg, SpO2 100 %. Body mass index is 28.12 kg/m.   Treatment Plan Summary:  Daily contact with patient to assess and evaluate symptoms and progress in treatment and Medication management   Continue with current treatment plan on 02/05/2021 as listed below except where noted   Assessment: Major depression disorder: Alcohol Use Disorder:  Plan: Discontinue Pristiq 50 mg p.o. daily Start venlafaxine 75 mg once daily tomorrow Continue naltrexone 50 mg p.o. daily Continue hydroxyzine 25 mg p.o. as needed 3 times daily Increase Seroquel to 50 mg p.o. nightly for help with sleep, paranoia and ruminations    CSW to continue working on discharge disposition Patient encouraged to participate within the therapeutic milieu  Total Time Spent in Direct Patient Care:  I personally spent 30 minutes on the unit in direct patient care. The direct patient care time included face-to-face time with the patient, reviewing the patient's chart, communicating with other professionals, and coordinating care. Greater than 50% of this time was spent in counseling or coordinating care with the patient regarding goals of hospitalization, psycho-education, and discharge planning needs.   Phineas Inches, MD Psychiatrist    Ardeth Perfect Payson Evrard, MD 02/05/2021, 1:51 PM

## 2021-02-05 NOTE — Plan of Care (Signed)
Nurse discussed anxiety, depression and coping skills with patient.  

## 2021-02-05 NOTE — Progress Notes (Signed)
D:  Patient denied SI and HI, contracts for safety.  Denied A/V hallucinations.  Denied pain. A:  Medications administered per MD orders.  Emotional support and encouragement given patient. R:  Safety maintained with 15 minute checks.  Patient stated she slept good last night and had good dreams.  "I feel fine and happy.  Pristiq makes me feel that I am losing my words."

## 2021-02-05 NOTE — Progress Notes (Signed)
Adult Psychoeducational Group Note  Date:  02/05/2021 Time:  10:50 AM  Group Topic/Focus:  Goals Group:   The focus of this group is to help patients establish daily goals to achieve during treatment and discuss how the patient can incorporate goal setting into their daily lives to aide in recovery.  Participation Level:  Active  Participation Quality:  Appropriate  Affect:  Appropriate  Cognitive:  Appropriate  Insight: Appropriate  Engagement in Group:  Engaged  Modes of Intervention:  Discussion  Additional Comments:  Pt stated her goal for the day is to find coping skills for anxiety.  Wynema Birch D 02/05/2021, 10:50 AM

## 2021-02-06 MED ORDER — NALTREXONE HCL 50 MG PO TABS: 50.0000 mg | ORAL_TABLET | Freq: Every day | ORAL | 0 refills | Status: DC

## 2021-02-06 MED ORDER — QUETIAPINE FUMARATE 25 MG PO TABS: 50.0000 mg | ORAL_TABLET | Freq: Every day | ORAL | 0 refills | Status: DC

## 2021-02-06 MED ORDER — TRAZODONE HCL 50 MG PO TABS: 50.0000 mg | ORAL_TABLET | Freq: Every evening | ORAL | 0 refills | Status: DC | PRN

## 2021-02-06 MED ORDER — VENLAFAXINE HCL ER 75 MG PO CP24: 75.0000 mg | ORAL_CAPSULE | Freq: Every day | ORAL | 0 refills | Status: DC

## 2021-02-06 NOTE — Progress Notes (Signed)
   02/06/21 0639  Vital Signs  Temp 98.2 F (36.8 C)  Temp Source Oral  Pulse Rate 61  Pulse Rate Source Monitor  Resp 18  BP 135/70  BP Location Left Arm  BP Method Automatic  Patient Position (if appropriate) Sitting  Oxygen Therapy  SpO2 100 %  O2 Device Room Air  CIWA-Ar  Nausea and Vomiting 0  Tactile Disturbances 0  Tremor 0  Auditory Disturbances 0  Paroxysmal Sweats 0  Visual Disturbances 0  Anxiety 0  Headache, Fullness in Head 0  Agitation 0  Orientation and Clouding of Sensorium 0  CIWA-Ar Total 0   D: Patient denies SI/HI/AVH. Rates anxiety 7/10 and denies depression. A:  Patient took scheduled medicine.  Support and encouragement provided Routine safety checks conducted every 15 minutes. Patient  Informed to notify staff with any concerns.   R: Safety maintained.

## 2021-02-06 NOTE — [facility] Group Notes (Signed)
Pt did not attend goals group. 

## 2021-02-06 NOTE — Progress Notes (Signed)
  [facility] Adult Case Management Discharge Plan :  Will you be returning to the same living situation after discharge:  Yes,  Home At discharge, do you have transportation home?: Yes,  Own Car Do you have the ability to pay for your medications: Yes,  Medicare   Release of information consent forms completed and in the chart;  Patient's signature needed at discharge.  Patient to Follow up at:  Follow-up Information     BEHAVIORAL HEALTH INTENSIVE CHEMICAL DEPENDENCY. Go on 02/14/2021.   Specialty: Behavioral Health Why: You have an appointment on 02/14/21 at 9:30 am to begin chemical/substance use intensive therapy and medication management services. This appointment will be held in person.  If you have any questions please call [phone] Contact information: [address] [place] [address] [id number] mc [place] [address] [phone]        [facility] Call.   Specialty: Professional Counselor Why: Please call this agency to find out more about EMDR and to schedule an appointment. Contact information: [address] [place] [address] [phone]                 Next level of care provider has access to [facility] Link:yes  Safety Planning and Suicide Prevention discussed: Yes,  with patient and husband      Has patient been referred to the Quitline?: Patient refused referral  Patient has been referred for addiction treatment: Yes  Aram Beecham, LCSWA 02/06/2021, 11:52 AM

## 2021-02-06 NOTE — Progress Notes (Signed)
   02/05/21 2300  Psych Admission Type (Psych Patients Only)  Admission Status Voluntary  Psychosocial Assessment  Patient Complaints Insomnia (Diarrhea and was medicated with PRN Immodium with good effect)  Eye Contact Fair  Facial Expression Flat  Affect Appropriate to circumstance  Speech Logical/coherent  Interaction Assertive  Motor Activity Other (Comment) (WDL)  Appearance/Hygiene Unremarkable  Behavior Characteristics Anxious;Cooperative  Mood Anxious  Aggressive Behavior  Effect No apparent injury  Thought Process  Coherency WDL  Content WDL  Delusions None reported or observed  Perception WDL  Hallucination None reported or observed  Judgment Impaired  Confusion None  Danger to Self  Current suicidal ideation? Denies  Danger to Others  Danger to Others None reported or observed

## 2021-02-06 NOTE — Discharge Summary (Addendum)
Physician Discharge Summary Note  Patient:  Tiffany Mueller is an 65 y.o., female MRN:  [id number] DOB:  28-Jul-1955 Patient phone:  [phone] (home)  Patient address:   [address] [place] [address],  Total Time spent with patient: 15 minutes  Date of Admission:  01/25/2021 Date of Discharge: 01/06/2021  Reason for Admission: Per admission assessment note: "Tiffany Mueller is a 65 y.o. Caucasian female with reported hx of major depressive disorder, ADHD, s/p bypass surgery for weight reduction in 2001, chronic alcoholism & other chronic medical issues. She walked-in to the [facility] voluntarily today seeking mental health evaluation. Patient presented to [facility] seeking inpatient admission. Patient states that she and her husband have been married for forty years and they also worked together. Patient states that she and her husband are having marital issues & are currently enrolled in an online counseling services. She states that they had a rough session yesterday & that led to her drinking immediately after the session at 11 am. " Patient was admitted to the psychiatric unit for safety and evaluation and treatment of worsening depression and suicidal thoughts.    Principal Problem: Major depressive disorder, recurrent episode, severe (HCC) Discharge Diagnoses: Principal Problem:   Major depressive disorder, recurrent episode, severe (HCC) Active Problems:   Alcohol use disorder, severe, dependence (HCC)   Past Psychiatric History:Alcohol use disorder, major depressive disorder, recurrent, ADD  Past Medical History:  Past Medical History:  Diagnosis Date   ADD (attention deficit disorder)    Anastomotic stricture of gastrojejunostomy 2002   EGD dilation   Anxiety    Arthritis    Carpal tunnel syndrome    Depression    Hypertension    Osteoporosis     Past Surgical History:  Procedure Laterality Date   ABDOMINAL HYSTERECTOMY  2007   APPENDECTOMY     CARPAL TUNNEL  RELEASE Left 07/04/2015   CARPAL TUNNEL RELEASE Left 07/04/2015   Procedure: LEFT CARPAL TUNNEL RELEASE;  Surgeon: Dominica Severin, MD;  Location: [facility] OR;  Service: Orthopedics;  Laterality: Left;   ESOPHAGOGASTRODUODENOSCOPY (EGD) WITH ESOPHAGEAL DILATION  2002   Dr Randa Evens.  GJ stricture   FRACTURE SURGERY     GASTRIC BYPASS  2001   [facility]   LAPAROSCOPIC CHOLECYSTECTOMY     LAPAROSCOPIC OOPHERECTOMY     LIGAMENT REPAIR Right    "wrist"   OPEN REDUCTION INTERNAL FIXATION (ORIF) DISTAL RADIAL FRACTURE Left 07/04/2015   WITH ALLOGRAFT BONE GRAFTING    OPEN REDUCTION INTERNAL FIXATION (ORIF) DISTAL RADIAL FRACTURE Left 07/04/2015   Procedure: OPEN REDUCTION INTERNAL FIXATION (ORIF) LEFT DISTAL RADIAL FRACTURE WITH ALLOGRAFT BONE GRAFTING;  Surgeon: Dominica Severin, MD;  Location: [facility] OR;  Service: Orthopedics;  Laterality: Left;   STRABISMUS SURGERY Bilateral    "4 on the left; 1 on right"   TUBAL LIGATION     Family History:  Family History  Problem Relation Age of Onset   Alzheimer's disease Mother    Breast cancer Mother    Atrial fibrillation Father    Breast cancer Maternal Grandmother    Family Psychiatric  History: See H&P Social History: See H&P Social History   Substance and Sexual Activity  Alcohol Use Yes   Alcohol/week: 28.0 standard drinks   Types: 7 Standard drinks or equivalent, 21 Glasses of wine per week   Comment: 2-3 glass wine nightly     Social History   Substance and Sexual Activity  Drug Use No  Social History   Socioeconomic History   Marital status: Married    Spouse name: Not on file   Number of children: Not on file   Years of education: Not on file   Highest education level: Not on file  Occupational History   Not on file  Tobacco Use   Smoking status: Every Day    Packs/day: 0.50    Years: 45.00    Pack years: 22.50    Types: Cigarettes   Smokeless tobacco: Never  Substance and Sexual Activity   Alcohol use: Yes    Alcohol/week:  28.0 standard drinks    Types: 7 Standard drinks or equivalent, 21 Glasses of wine per week    Comment: 2-3 glass wine nightly   Drug use: No   Sexual activity: Yes  Other Topics Concern   Not on file  Social History Narrative   Not on file   Social Determinants of Health   Financial Resource Strain: Not on file  Food Insecurity: Not on file  Transportation Needs: Not on file  Physical Activity: Not on file  Stress: Not on file  Social Connections: Not on file    Hospital Course:  MAHITHA HICKLING was admitted for Major depressive disorder, recurrent episode, severe (HCC) and crisis management.  Pt was treated discharged with the medications listed below under Medication List.  Medical problems were identified and treated as needed.  Home medications were restarted as appropriate.  Improvement was monitored by observation and [name] 's daily report of symptom reduction.  Emotional and mental status was monitored by daily self-inventory reports completed by [name] and clinical staff.         [name] was evaluated by the treatment team for stability and plans for continued recovery upon discharge. [name] 's motivation was an integral factor for scheduling further treatment. Employment, transportation, bed availability, health status, family support, and any pending legal issues were also considered during hospital stay. Pt was offered further treatment options upon discharge including but not limited to Residential, Intensive Outpatient, and Outpatient treatment.  [name] will follow up with the services as listed below under Follow Up Information.     Patient's psychiatric medications were adjusted on admission:  -Discontinue Wellbutrin, due to ineffectiveness, and patient's eating disorder symptoms and patient's increased risk of seizures during alcohol withdrawal. -Start Zoloft 25 mg once daily today.  Titrate during hospitalization.  For mood,  anxiety, and PTSD.  We will use liquid Zoloft, as the patient's rouzeny and reversal, make that breakdown and switching of tablets unpredictable, which makes enough medication that can be absorbed and predictable. -Continue trazodone 50 mg as needed  -Restoril was dc.  During the hospitalization, other adjustments were made to the patient's psychiatric medication regimen:  -During hospitalization, Zoloft was increased, the patient denied having good effect from this medication, and it was stopped -Abilify was started, but caused allergic in reaction and rash, and was stopped.  Allergic reaction and rash have fully gone away. -Pristiq was started, but caused the patient to be too activated and was stopped due to this. -Venlafaxine was started for mood and anxiety.  Patient did have venlafaxine in the past, for many years, with good effect, until a few months prior to this hospitalization. -Seroquel was started for sleep.  Patient reports this medication has been helpful for insomnia.  She reports that the content of the vivid dreams, sometimes pleasant, and sometimes caused her to  feel anxious.  Upon completion of this admission the patient was both mentally and medically stable for discharge denying suicidal/homicidal ideation, auditory/visual/tactile hallucinations, delusional thoughts and paranoia.    Pt demonstrated improvement without reported or observed adverse effects to the point of stability appropriate for outpatient management. Pertinent labs include: BMP, Folate and CBC  for which outpatient follow-up is necessary for lab recheck as mentioned below. Reviewed CBC, CMP, BAL, and UDS; all unremarkable aside from noted exceptions.    Physical Findings: AIMS: Scored 0 on 02/06/2021 CIWA:  CIWA-Ar Total: 0 COWS:     Musculoskeletal: Strength & Muscle Tone: within normal limits Gait & Station: normal Patient leans: N/A   Psychiatric Specialty Exam:  Presentation  General Appearance:  Appropriate for Environment; Casual; Fairly Groomed  Eye Contact:Good  Speech:Clear and Coherent; Normal Rate  Speech Volume:Normal  Handedness:Right   Mood and Affect  Mood:Anxious; Euthymic  Affect:Appropriate; Congruent; Full Range   Thought Process  Thought Processes:Coherent; Linear  Descriptions of Associations:Intact  Orientation:Full (Time, Place and Person)  Thought Content:Logical  History of Schizophrenia/Schizoaffective disorder:No  Duration of Psychotic Symptoms:N/A Hallucinations:Hallucinations: None  Ideas of Reference:None  Suicidal Thoughts:Suicidal Thoughts: No  Homicidal Thoughts:Homicidal Thoughts: No   Sensorium  Memory:Immediate Good; Recent Good; Remote Good  Judgment:Good  Insight:Good   Executive Functions  Concentration:Fair  Attention Span:Fair  Recall:Good  Fund of Knowledge:Good  Language:Good   Psychomotor Activity  Psychomotor Activity:Psychomotor Activity: Normal   Assets  Assets:Communication Skills; Desire for Improvement; Resilience; Social Support   Sleep  Sleep:Sleep: Good    Physical Exam: Physical Exam Vitals and nursing note reviewed.  Cardiovascular:     Rate and Rhythm: Normal rate and regular rhythm.  Pulmonary:     Effort: Pulmonary effort is normal.  Neurological:     Mental Status: She is oriented to person, place, and time.  Psychiatric:        Mood and Affect: Mood normal.        Thought Content: Thought content normal.   Review of Systems  Psychiatric/Behavioral:  Positive for substance abuse. Negative for depression. The patient is nervous/anxious.   All other systems reviewed and are negative. Blood pressure 115/89, pulse 72, temperature 98.2 F (36.8 C), temperature source Oral, resp. rate 18, height 5' 0.63" (1.54 m), weight 66.7 kg, SpO2 100 %. Body mass index is 28.12 kg/m.   Social History   Tobacco Use  Smoking Status Every Day   Packs/day: 0.50   Years: 45.00    Pack years: 22.50   Types: Cigarettes  Smokeless Tobacco Never   Tobacco Cessation:  N/A, patient does not currently use tobacco products   Blood Alcohol level:  Lab Results  Component Value Date   ETH <10 01/26/2021    Metabolic Disorder Labs:  Lab Results  Component Value Date   HGBA1C 5.1 01/26/2021   MPG 99.67 01/26/2021   No results found for: PROLACTIN Lab Results  Component Value Date   CHOL 196 01/26/2021   TRIG 127 01/26/2021   HDL 72 01/26/2021   CHOLHDL 2.7 01/26/2021   VLDL 25 01/26/2021   LDLCALC 99 01/26/2021    See Psychiatric Specialty Exam and Suicide Risk Assessment completed by Attending Physician prior to discharge.  Discharge destination:  Home  Is patient on multiple antipsychotic therapies at discharge:  No   Has Patient had three or more failed trials of antipsychotic monotherapy by history:  No  Recommended Plan for Multiple Antipsychotic Therapies: NA  Discharge Instructions  Diet - low sodium heart healthy   Complete by: As directed    Discharge instructions   Complete by: As directed    Take all medications as prescribed. Keep all follow-up appointments as scheduled.  Do not consume alcohol or use illegal drugs while on prescription medications. Report any adverse effects from your medications to your primary care provider promptly.  In the event of recurrent symptoms or worsening symptoms, call 911, a crisis hotline, or go to the nearest emergency department for evaluation.   Increase activity slowly   Complete by: As directed       Allergies as of 02/06/2021       Reactions   Cefaclor Rash   Cephalosporins Itching        Medication List     STOP taking these medications    amphetamine-dextroamphetamine 30 MG tablet Commonly known as: ADDERALL   buPROPion 150 MG 24 hr tablet Commonly known as: WELLBUTRIN XL   temazepam 15 MG capsule Commonly known as: RESTORIL       TAKE these medications       Indication  fluticasone 50 MCG/ACT nasal spray Commonly known as: FLONASE Place 2 sprays into both nostrils daily as needed for allergies.  Indication: Allergic Rhinitis   furosemide 40 MG tablet Commonly known as: LASIX Take 40 mg by mouth daily.  Indication: High Blood Pressure Disorder   losartan 50 MG tablet Commonly known as: COZAAR Take 50 mg by mouth daily.  Indication: High Blood Pressure Disorder   MULTIVITAMIN GUMMIES ADULT PO Take 1 tablet by mouth daily.  Indication: Vitamin D Deficiency   naltrexone 50 MG tablet Commonly known as: DEPADE Take 1 tablet (50 mg total) by mouth daily. Start taking on: February 07, 2021 What changed: when to take this  Indication: Abuse or Misuse of Alcohol   QUEtiapine 25 MG tablet Commonly known as: SEROQUEL Take 2 tablets (50 mg total) by mouth at bedtime.  Indication: Agitation, Generalized Anxiety Disorder, Major Depressive Disorder   traZODone 50 MG tablet Commonly known as: DESYREL Take 1 tablet (50 mg total) by mouth at bedtime as needed for sleep.  Indication: Abuse or Misuse of Alcohol, Major Depressive Disorder   venlafaxine XR 75 MG 24 hr capsule Commonly known as: EFFEXOR-XR Take 1 capsule (75 mg total) by mouth daily with breakfast. Start taking on: February 07, 2021  Indication: Major Depressive Disorder        Follow-up Information     BEHAVIORAL HEALTH INTENSIVE CHEMICAL DEPENDENCY. Go on 02/14/2021.   Specialty: Behavioral Health Why: You have an appointment on 02/14/21 at 9:30 am to begin chemical/substance use intensive therapy and medication management services. This appointment will be held in person.  If you have any questions please call [phone] Contact information: [address] [place] [address] [id number] mc [place] [address] [phone]        [facility] Call.   Specialty: Professional Counselor Why: Please call this agency to find out more about EMDR and to schedule  an appointment. Contact information: [address] [place] [address] [phone]                 Follow-up recommendations:  Activity:  as tolerated Diet:  heart healthy  Other:  CDIOP   Comments:  Take all medications as prescribed. Keep all follow-up appointments as scheduled.  Do not consume alcohol or use illegal drugs while on prescription medications. Report any adverse effects from your medications to your primary care  provider promptly.  In the event of recurrent symptoms or worsening symptoms, call 911, a crisis hotline, or go to the nearest emergency department for evaluation.     Total Time Spent in Direct Patient Care:  I personally spent 45 minutes on the unit in direct patient care. The direct patient care time included face-to-face time with the patient, reviewing the patient's chart, communicating with other professionals, and coordinating care. Greater than 50% of this time was spent in counseling or coordinating care with the patient regarding goals of hospitalization, psycho-education, and discharge planning needs.  On my assessment the patient denied SI, HI, AVH, paranoia, ideas of reference, or first rank symptoms on day of discharge. Patient denied drug cravings or active signs of withdrawal. Patient denied medication side-effects. Patient was not deemed to be a danger to self or others on day of discharge and was in agreement with discharge plans.   I have independently evaluated the patient during a face-to-face assessment on 02/06/21. I reviewed the patient's chart, and I participated in key portions of the service. I discussed the case with the APP, and I agree with the assessment and plan of care as documented in the APP's note, as addended by me or notated below:  I directly edited in the discharge summary and plan, as above.  Phineas Inches, MD Psychiatrist

## 2021-02-06 NOTE — Final Progress Note (Signed)
Discharge Note:  Patient denies SI/HI AVH at this time. Discharge instructions, AVS, prescriptions and transition record gone over with patient. Patient agrees to comply with medication management, follow-up visit, and outpatient therapy. Patient belongings returned to patient. Patient questions and concerns addressed and answered.  Patient ambulatory off unit.  Patient discharged to home.   
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
## 2021-02-07 ENCOUNTER — Ambulatory Visit (HOSPITAL_COMMUNITY): Payer: Medicare Other | Admitting: Licensed Clinical Social Worker

## 2021-02-13 ENCOUNTER — Other Ambulatory Visit (HOSPITAL_COMMUNITY): Payer: Self-pay | Admitting: Family

## 2021-02-14 ENCOUNTER — Encounter (HOSPITAL_COMMUNITY): Payer: Self-pay

## 2021-02-14 ENCOUNTER — Other Ambulatory Visit: Payer: Self-pay

## 2021-02-14 ENCOUNTER — Ambulatory Visit (INDEPENDENT_AMBULATORY_CARE_PROVIDER_SITE_OTHER): Payer: Medicare Other | Admitting: Licensed Clinical Social Worker

## 2021-02-14 DIAGNOSIS — F1020 Alcohol dependence, uncomplicated: Secondary | ICD-10-CM

## 2021-02-14 DIAGNOSIS — F1994 Other psychoactive substance use, unspecified with psychoactive substance-induced mood disorder: Secondary | ICD-10-CM
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
## 2021-02-19 ENCOUNTER — Other Ambulatory Visit (HOSPITAL_COMMUNITY): Payer: Medicare Other | Attending: Medical | Admitting: Medical

## 2021-02-19 ENCOUNTER — Encounter (HOSPITAL_COMMUNITY): Payer: Self-pay | Admitting: Medical

## 2021-02-19 ENCOUNTER — Other Ambulatory Visit: Payer: Self-pay

## 2021-02-19 VITALS — BP 145/67 | HR 62 | Ht 63.0 in | Wt 148.8 lb

## 2021-02-19 DIAGNOSIS — F332 Major depressive disorder, recurrent severe without psychotic features: Secondary | ICD-10-CM | POA: Diagnosis not present

## 2021-02-19 DIAGNOSIS — F4312 Post-traumatic stress disorder, chronic: Secondary | ICD-10-CM | POA: Diagnosis not present

## 2021-02-19 DIAGNOSIS — F17210 Nicotine dependence, cigarettes, uncomplicated: Secondary | ICD-10-CM | POA: Diagnosis not present

## 2021-02-19 DIAGNOSIS — F5081 Binge eating disorder: Secondary | ICD-10-CM

## 2021-02-19 DIAGNOSIS — Z8739 Personal history of other diseases of the musculoskeletal system and connective tissue: Secondary | ICD-10-CM

## 2021-02-19 DIAGNOSIS — Z90710 Acquired absence of both cervix and uterus: Secondary | ICD-10-CM | POA: Diagnosis not present

## 2021-02-19 DIAGNOSIS — F1020 Alcohol dependence, uncomplicated: Secondary | ICD-10-CM

## 2021-02-19 DIAGNOSIS — F331 Major depressive disorder, recurrent, moderate: Secondary | ICD-10-CM

## 2021-02-19 DIAGNOSIS — F50819 Binge eating disorder, unspecified: Secondary | ICD-10-CM

## 2021-02-19 DIAGNOSIS — Z9884 Bariatric surgery status: Secondary | ICD-10-CM

## 2021-02-19 DIAGNOSIS — Z8719 Personal history of other diseases of the digestive system: Secondary | ICD-10-CM | POA: Diagnosis not present

## 2021-02-19 DIAGNOSIS — I10 Essential (primary) hypertension: Secondary | ICD-10-CM | POA: Diagnosis not present

## 2021-02-19 DIAGNOSIS — F10982 Alcohol use, unspecified with alcohol-induced sleep disorder: Secondary | ICD-10-CM

## 2021-02-19 DIAGNOSIS — T7492XS Unspecified child maltreatment, confirmed, sequela: Secondary | ICD-10-CM

## 2021-02-19 DIAGNOSIS — F1994 Other psychoactive substance use, unspecified with psychoactive substance-induced mood disorder: Secondary | ICD-10-CM

## 2021-02-19 DIAGNOSIS — Z8659 Personal history of other mental and behavioral disorders: Secondary | ICD-10-CM
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
## 2021-02-21 ENCOUNTER — Other Ambulatory Visit: Payer: Self-pay

## 2021-02-21 ENCOUNTER — Other Ambulatory Visit (HOSPITAL_COMMUNITY): Payer: Self-pay | Admitting: Family

## 2021-02-21 ENCOUNTER — Other Ambulatory Visit (HOSPITAL_COMMUNITY): Payer: Medicare Other

## 2021-02-22 ENCOUNTER — Other Ambulatory Visit (HOSPITAL_COMMUNITY): Payer: Medicare Other | Admitting: Licensed Clinical Social Worker

## 2021-02-22 DIAGNOSIS — F332 Major depressive disorder, recurrent severe without psychotic features: Secondary | ICD-10-CM | POA: Diagnosis not present

## 2021-02-22 DIAGNOSIS — Z9884 Bariatric surgery status: Secondary | ICD-10-CM | POA: Diagnosis not present

## 2021-02-22 DIAGNOSIS — I10 Essential (primary) hypertension: Secondary | ICD-10-CM | POA: Diagnosis not present

## 2021-02-22 DIAGNOSIS — Z90710 Acquired absence of both cervix and uterus: Secondary | ICD-10-CM | POA: Diagnosis not present

## 2021-02-22 DIAGNOSIS — F1020 Alcohol dependence, uncomplicated: Secondary | ICD-10-CM | POA: Diagnosis not present

## 2021-02-22 DIAGNOSIS — F17210 Nicotine dependence, cigarettes, uncomplicated: Secondary | ICD-10-CM | POA: Diagnosis not present

## 2021-02-22 DIAGNOSIS — Z8739 Personal history of other diseases of the musculoskeletal system and connective tissue: Secondary | ICD-10-CM | POA: Diagnosis not present

## 2021-02-22 DIAGNOSIS — F1994 Other psychoactive substance use, unspecified with psychoactive substance-induced mood disorder: Secondary | ICD-10-CM | POA: Diagnosis not present

## 2021-02-22 DIAGNOSIS — Z8719 Personal history of other diseases of the digestive system: Secondary | ICD-10-CM | POA: Diagnosis not present

## 2021-02-22 DIAGNOSIS — F10982 Alcohol use, unspecified with alcohol-induced sleep disorder: Secondary | ICD-10-CM | POA: Diagnosis not present

## 2021-02-22 NOTE — Progress Notes (Signed)
Daily Group Progress Note   Program: CD-IOP     Group Time: 1pm-2:00pm Participation Level: Active Behavioral Response: Appropriate and sharing Type of Therapy: Process Group Topic: Clinician checked in with group members, assessing for SI/HI/psychosis and overall level of functioning. Clinician and group members processed 'highs' and 'lows' and any challenges related to recovery. Clinician and group members read and reviewed NA's 'Just for Today' and AA's 'Daily Reflection' with focus on being patient with mood fluctuations experienced during early recovery and staying connected to a positive support network composed of sober peers.  Clinician inquired about how clients have attempted to cope with mood changes, and any changes that they need to make to their support network to reduce relapse risk.     Group Time: 2:00pm-3:00pm Participation Level: Active Behavioral Response: Appropriate and sharing Type of Therapy: Psycho-education Group Topic:  Psycho-educational portion of group was co-facilitated by wellness director (Frederich Balding, MS, MPH, CHES) focused on self-care in daily life. Facilitator and group members discussed presented materials regarding importance of sleep, diet, and exercise during recovery. Group members discussed any changes they are willing to make to improve an area of self-care in their lives (physical, psychological, emotional, spiritual, relationship, professional) to improve overall mental health and reinforce abstinence efforts as they continue with substance abuse treatment.   Group Time: 3:00pm-4:00pm Participation Level: Active Behavioral Response: Appropriate and sharing Type of Therapy: Psycho-education Group Topic: Clinician provided handout to clients with a poem titled "A letter from my disease".  This poem was written from the perspective of one's addiction, and explained how it can manifest in several different ways (i.e. drugs, alcohol, gambling, overeating,  etc); justifications people rely on for continued use (i.e. numbing oneself from pain or unwanted emotions, escaping from problems, instant gratification) and strategies for strengthening relapse prevention plan such as attending 12 step meetings, acquiring a sponsor, and sober support network. Clinician inquired about clients' perception of the poem, what they found relatable, and what steps they can begin taking today to reinforce focus on healthier, sober lifestyle.     Summary: Client presented to this group session on time and was alert, oriented x5, with no evidence or self-report of active SI/HI or A/V H.  Client reported compliance with medications and denied using any alcohol or drugs since previous check-in with primary clinician.  Client reported that she attended a recovery meeting this morning and is in contact with her sponsor.  Client denied any recent successes.  Client reported that her struggle at this time is speaking up to her husband about the need to abstain from certain social activities which could expose her to alcohol use.  Interventions were effective, as evidenced by client participating in discussion on both topics today, reporting that she will make it a goal to become more assertive in order to establish healthier boundaries ahead of the cruise trip she has planned with her husband in a few weeks.  Client also reported that she would attempt to improve self-care routine based upon speaker's advice on including more exercise during the day, with goal of walking her dog again throughout the week so she can achieve 3 mile average per event, in addition to implementing sleep hygiene techniques to improve restfulness during the night.    Family Program: Family present? No              Name of family member(s): NA   UDS collected: No Results: N/A   AA/NA attended?: Yes  Sponsor?: Yes  Noralee Stain, [place], LCAS 02/22/21

## 2021-02-26 ENCOUNTER — Encounter (HOSPITAL_COMMUNITY): Payer: Medicare Other

## 2021-02-28 ENCOUNTER — Other Ambulatory Visit: Payer: Self-pay

## 2021-02-28 ENCOUNTER — Other Ambulatory Visit (HOSPITAL_COMMUNITY): Payer: Self-pay | Admitting: Medical

## 2021-02-28 ENCOUNTER — Encounter (HOSPITAL_COMMUNITY): Payer: Self-pay | Admitting: Medical

## 2021-02-28 ENCOUNTER — Other Ambulatory Visit (HOSPITAL_COMMUNITY): Payer: Medicare Other | Admitting: Medical

## 2021-02-28 DIAGNOSIS — F17210 Nicotine dependence, cigarettes, uncomplicated: Secondary | ICD-10-CM

## 2021-02-28 DIAGNOSIS — F1020 Alcohol dependence, uncomplicated: Secondary | ICD-10-CM | POA: Diagnosis not present

## 2021-02-28 DIAGNOSIS — F1994 Other psychoactive substance use, unspecified with psychoactive substance-induced mood disorder: Secondary | ICD-10-CM

## 2021-02-28 DIAGNOSIS — F5081 Binge eating disorder: Secondary | ICD-10-CM

## 2021-02-28 DIAGNOSIS — F10982 Alcohol use, unspecified with alcohol-induced sleep disorder: Secondary | ICD-10-CM

## 2021-02-28 DIAGNOSIS — Z8659 Personal history of other mental and behavioral disorders: Secondary | ICD-10-CM

## 2021-02-28 DIAGNOSIS — F4312 Post-traumatic stress disorder, chronic: Secondary | ICD-10-CM

## 2021-02-28 MED ORDER — QUETIAPINE FUMARATE 50 MG PO TABS: 50.0000 mg | ORAL_TABLET | Freq: Two times a day (BID) | ORAL | 1 refills | Status: DC

## 2021-02-28 MED ORDER — NALTREXONE HCL 50 MG PO TABS: 50.0000 mg | ORAL_TABLET | Freq: Every day | ORAL | 0 refills | Status: DC

## 2021-02-28 MED ORDER — VENLAFAXINE HCL ER 75 MG PO CP24: 75.0000 mg | ORAL_CAPSULE | Freq: Every day | ORAL | 0 refills | Status: DC

## 2021-02-28 NOTE — Progress Notes (Signed)
Patient ID: Tiffany Mueller, female   DOB: 09-10-1955, 65 y.o.   MRN: [id number] Medication review completed.

## 2021-02-28 NOTE — Progress Notes (Signed)
Orders only visit.

## 2021-03-05 ENCOUNTER — Other Ambulatory Visit (HOSPITAL_COMMUNITY): Payer: Medicare Other | Admitting: Licensed Clinical Social Worker

## 2021-03-05 ENCOUNTER — Other Ambulatory Visit: Payer: Self-pay

## 2021-03-05 DIAGNOSIS — F4312 Post-traumatic stress disorder, chronic: Secondary | ICD-10-CM

## 2021-03-05 DIAGNOSIS — F1020 Alcohol dependence, uncomplicated: Secondary | ICD-10-CM

## 2021-03-05 DIAGNOSIS — F5081 Binge eating disorder: Secondary | ICD-10-CM

## 2021-03-06 MED ORDER — ATOMOXETINE HCL 80 MG PO CAPS: 80.0000 mg | ORAL_CAPSULE | Freq: Every morning | ORAL | 0 refills | Status: DC

## 2021-03-07 ENCOUNTER — Other Ambulatory Visit (HOSPITAL_COMMUNITY): Payer: Medicare Other | Admitting: Licensed Clinical Social Worker

## 2021-03-07 ENCOUNTER — Other Ambulatory Visit: Payer: Self-pay

## 2021-03-07 DIAGNOSIS — F5081 Binge eating disorder: Secondary | ICD-10-CM

## 2021-03-07 DIAGNOSIS — F4312 Post-traumatic stress disorder, chronic: Secondary | ICD-10-CM

## 2021-03-07 DIAGNOSIS — F1020 Alcohol dependence, uncomplicated: Secondary | ICD-10-CM | POA: Diagnosis not present

## 2021-03-08 ENCOUNTER — Other Ambulatory Visit (HOSPITAL_COMMUNITY): Payer: Medicare Other | Attending: Psychiatry | Admitting: Licensed Clinical Social Worker

## 2021-03-08 DIAGNOSIS — F909 Attention-deficit hyperactivity disorder, unspecified type: Secondary | ICD-10-CM | POA: Insufficient documentation

## 2021-03-08 DIAGNOSIS — F1994 Other psychoactive substance use, unspecified with psychoactive substance-induced mood disorder: Secondary | ICD-10-CM | POA: Insufficient documentation

## 2021-03-08 DIAGNOSIS — F4312 Post-traumatic stress disorder, chronic: Secondary | ICD-10-CM | POA: Insufficient documentation

## 2021-03-08 DIAGNOSIS — F5081 Binge eating disorder: Secondary | ICD-10-CM | POA: Insufficient documentation

## 2021-03-08 DIAGNOSIS — F1020 Alcohol dependence, uncomplicated: Secondary | ICD-10-CM | POA: Diagnosis present

## 2021-03-08 DIAGNOSIS — F10982 Alcohol use, unspecified with alcohol-induced sleep disorder: Secondary | ICD-10-CM | POA: Insufficient documentation

## 2021-03-08 DIAGNOSIS — I10 Essential (primary) hypertension: Secondary | ICD-10-CM | POA: Diagnosis not present

## 2021-03-08 DIAGNOSIS — Z62819 Personal history of unspecified abuse in childhood: Secondary | ICD-10-CM | POA: Insufficient documentation

## 2021-03-08 DIAGNOSIS — F17210 Nicotine dependence, cigarettes, uncomplicated: Secondary | ICD-10-CM | POA: Diagnosis not present

## 2021-03-08 DIAGNOSIS — F331 Major depressive disorder, recurrent, moderate: Secondary | ICD-10-CM | POA: Diagnosis not present

## 2021-03-08 NOTE — Progress Notes (Signed)
    Daily Group Progress Note  Program: CD-IOP   Group Time: 1-2:30pm Participation Level: Active Behavioral Response: Appropriate Type of Therapy: Process Group Topic: Process Group Topic: Clinician checked in with group members, assessing for SI/HI/psychosis and overall level of functioning including relapse and barriers to recovery. Clinician and group members discussed highlights and challenges since last group related to maintaining sobriety including identified triggers and responses. Clinician and group members processed Daily Reflection and personal meaning to current work in recovery with a focus on "Sharing the Real Me." Clinician utilized motivational interviewing OARS for deeper processing.  Group Time: 2:30pm-4pm Participation Level: Active Behavioral Response: Appropriate Type of Therapy: Process Group Topic:  Clinician utilized review of CBT triangle and reframing unhealthy thoughts (Catch, Challenge, Change) when group members experience negative self-talk and practiced reframing in session. Clinician provided psycho-educational information on Dysfunctional Family Roles and the Family and family 'Culture' related to substance abuse. Clinician and group members discussed reasons for roles including ability to rational, denial, avoidance of pain, and excuses to continue behaviors. Clinician reviewed roles and discussed with clients roles in current family as the chemically dependent person and any roles in family of origin. Clinician discussed the importance of identifying strengths of identified role and behaviors to be addressed to avoid unhealthy behaviors such as not getting needs met and resulting resentment as discussed in previous group. Clinician inquired about self care activity to support recovery to be completed before next group.   Summary: Client presented fully oriented, did not endorse SI/HI, did not appear psychotic or intoxicated at the time of session. Client  showed progress toward goal of achieved/maintained sobriety 7/7 days weekly AEB sobriety date of 01/25/21. Client showed progress toward goal of building sober support system AEB regular attendance of AA meetings daily. Client provided activity which could have minor challenges related to dinner atmosphere however was planning to present with friend who supported recovery. Client was receptive to psycho-education however struggled to separate roles in original and nuclear family. Client will continue to identify personal behavior patterns and set boundaries with others to protect own recovery.   Family Program: Family present? No     UDS collected: No Results: pending  AA/NA attended?: Yes  Sponsor?: Yes   Micheline Chapman, LCSW, LCAS

## 2021-03-12 ENCOUNTER — Other Ambulatory Visit: Payer: Self-pay

## 2021-03-12 ENCOUNTER — Other Ambulatory Visit (HOSPITAL_COMMUNITY): Payer: Medicare Other

## 2021-03-12 NOTE — Progress Notes (Signed)
    Daily Group Progress Note  Program: CD-IOP   Group Time: 1pm-2:30pm Participation Level: Active Behavioral Response: Appropriate and Sharing Type of Therapy: Process Group Topic: Clinician checked in with group members, assessed for SI/HI/psychosis and overall level of functioning. Clinician inquired about sobriety date and number of community support meetings attended since last session. Clinician and group members processed events that went well and any challenges to recovery. Clinician and group members explored coping skills used to address challenges and problem solved additional options for next time a similar challenge arises. Clinician and group members read AA Daily Reflection and NA Just For Today and reflected on meaning related to current place in recovery.  Group Time: 2:30pm-4pm Participation Level: Active Behavioral Response: Appropriate and Sharing Type of Therapy: Psychoeducational Group Topic Clinician provided psycho-education for Co-occurring Disorders Treatment Workbook on 'Identifying High Risk Situations for Substance Abuse and Mental Health Relapse.' Clinician utilized Motivational interviewing and supplemental material from [employer] Manual to identify internal and external triggers. Clinician reviewed cognitive triangle and connection of thoughts, feelings, and behaviors. Clinician encouraged group members to utilize outside support system to help identify personal warning signs possibly related to relapse. Clinician facilitated Laughter Yoga exercise in session, encouraging clients to fully participate, non-judgmentally.  Clinician and group members celebrated successful graduation of 2 group members.    Summary: Client presented fully oriented, did not endorse SI/HI, did not appear psychotic or intoxicated at the time of assessment. Client showed some progress toward goal of achieving and maintaining sobriety 7/7 days weekly AEB sobriety date of 01/25/21. Client  showed progress toward goal of building sober support system AEB attending daily morning meetings. Client provided follow up on recent possible high risk situations going well and started creating plan for upcoming trip. Client was receptive toward psycho-education and able to identify multiple behaviors and thought patterns which could be high risk for relapse. Client participated in session activity.   Family Program: Family present? No   Name of family member(s): NA  UDS collected: No Results: NA  AA/NA attended?: Yes  Sponsor?: Yes   Fulton Reek, LCSW, LCAS

## 2021-03-13 NOTE — Progress Notes (Signed)
    Daily Group Progress Note  Program: CD-IOP   GGroup Time: 1pm-2:30pm Participation Level: Active Behavioral Response: Appropriate and Sharing Type of Therapy: Process Group Topic: Clinician checked in with clients, assessing for SI/HI/psychosis and overall level of functioning. Clinician and group members discussed 'highs' and 'lows' since last session. Clinician processed with group members ongoing triggers and challenges. Clients reflected on utilization of skills and education from group outside of session. Clinician and group members read and responded to AA Daily and NA reflection related humility in recovery.     Group Time: 2:30pm-4pm Participation Level: Active Behavioral Response: Appropriate and Sharing Type of Therapy: Psycho-educational Group Topic: Clinician presented psycho-educational material on Distorted thinking styles. Clinician and group members reviewed examples of each distortion. Clinician reviewed CBT triangle and the connection of thoughts, feelings, and behaviors. Clinician and group members explored ways to challenge distorted thoughts to improve healthy behaviors to support sobriety. Clinician and group members identified recent cognitive distortions and practiced in session creating alternate thought patterns.  Clinician inquired about self-care activity planned before the next group to support recovery.    Summary: Client checked in making progress toward goal of maintaining sobriety 7/7 days weekly AEB maintained sobriety date of 01/25/21. Client showed progress toward goal of building sober support system AEB attending 3 AA meetings. Client was receptive to psycho-education and able to identify personal use of distorted thinking in relation to addiction and recovery. Client was able to create healthy thoughts to challenge distorted thinking with support of group members. Client continues to plan for upcoming high risk situation and provided examples of  successfully coping with peers drinking over the holidays   Family Program: Family present? No   Name of family member(s): NA  UDS collected: No   AA/NA attended?: Yes  Sponsor?: Yes   Fulton Reek, LCSW, LCAS

## 2021-03-14 ENCOUNTER — Encounter (HOSPITAL_COMMUNITY): Payer: Medicare Other

## 2021-03-15 ENCOUNTER — Telehealth (HOSPITAL_COMMUNITY): Payer: Self-pay | Admitting: Licensed Clinical Social Worker

## 2021-03-15 ENCOUNTER — Encounter (HOSPITAL_COMMUNITY): Payer: Medicare Other

## 2021-03-19 ENCOUNTER — Other Ambulatory Visit (HOSPITAL_COMMUNITY): Payer: Medicare Other | Admitting: Licensed Clinical Social Worker

## 2021-03-19 ENCOUNTER — Other Ambulatory Visit: Payer: Self-pay

## 2021-03-19 ENCOUNTER — Encounter (HOSPITAL_COMMUNITY): Payer: Self-pay | Admitting: Medical

## 2021-03-19 DIAGNOSIS — F10982 Alcohol use, unspecified with alcohol-induced sleep disorder: Secondary | ICD-10-CM

## 2021-03-19 DIAGNOSIS — Z8659 Personal history of other mental and behavioral disorders: Secondary | ICD-10-CM

## 2021-03-19 DIAGNOSIS — Z62819 Personal history of unspecified abuse in childhood: Secondary | ICD-10-CM | POA: Diagnosis not present

## 2021-03-19 DIAGNOSIS — F1020 Alcohol dependence, uncomplicated: Secondary | ICD-10-CM

## 2021-03-19 DIAGNOSIS — I10 Essential (primary) hypertension: Secondary | ICD-10-CM

## 2021-03-19 DIAGNOSIS — Z8719 Personal history of other diseases of the digestive system: Secondary | ICD-10-CM

## 2021-03-19 DIAGNOSIS — F909 Attention-deficit hyperactivity disorder, unspecified type: Secondary | ICD-10-CM | POA: Diagnosis not present

## 2021-03-19 DIAGNOSIS — F4312 Post-traumatic stress disorder, chronic: Secondary | ICD-10-CM | POA: Diagnosis not present

## 2021-03-19 DIAGNOSIS — Z8739 Personal history of other diseases of the musculoskeletal system and connective tissue: Secondary | ICD-10-CM

## 2021-03-19 DIAGNOSIS — Z90710 Acquired absence of both cervix and uterus: Secondary | ICD-10-CM

## 2021-03-19 DIAGNOSIS — T7492XS Unspecified child maltreatment, confirmed, sequela: Secondary | ICD-10-CM

## 2021-03-19 DIAGNOSIS — F331 Major depressive disorder, recurrent, moderate: Secondary | ICD-10-CM

## 2021-03-19 DIAGNOSIS — F17210 Nicotine dependence, cigarettes, uncomplicated: Secondary | ICD-10-CM

## 2021-03-19 DIAGNOSIS — F1994 Other psychoactive substance use, unspecified with psychoactive substance-induced mood disorder: Secondary | ICD-10-CM

## 2021-03-19 DIAGNOSIS — F5081 Binge eating disorder: Secondary | ICD-10-CM

## 2021-03-19 DIAGNOSIS — Z9884 Bariatric surgery status: Secondary | ICD-10-CM

## 2021-03-19 DIAGNOSIS — F50819 Binge eating disorder, unspecified: Secondary | ICD-10-CM
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
## 2021-03-20 NOTE — Progress Notes (Signed)
    Daily Group Progress Note  Program: CD-IOP   Group Time: 1pm-2:30pm Participation Level: Active Behavioral Response: Appropriate Type of Therapy: Clinician checked in with clients, assessing for SI/HI/psychosis and overall level of functioning, including sobriety dates and number of support groups attended since last group. Clinician and group members processed highlights and challenges since last group session. Clients processed any barriers to sobriety. Clinician facilitated group member discussion on problem solving supporting recovery while on vacations. Clinician and clients read AA Daily reflection focused on a common solution and NA reflection around fear of change and having faith in the program and higher power.   Group Time: 2:30pm-4pm Participation Level: Active Behavioral Response: Appropriate Type of Therapy: Psycho-education Group Topic: Clinician presented the topic of 'Feelings, Thoughts, and Mind Traps' from [place]. Clinician processed with group members different mind traps and role played how to challenge each type of thought. Clinician challenged group members to identify distorted thoughts, the feeling which came with the thought, and create an alternative thoughts. Clinician and group members reviewed steps for challenging mind traps, starting with identifying uncomfortable feeling and related thought and disrupting thoughts with positive self-talk and alternative, helpful thoughts. Clinician presented 'Roadblocks to Healthy Thinking: Ways of Thinking That Keep You Stuck." Clinician challenged group members to identify a time where they were caught in these, or others were caught in them during an interaction. Clinician and group members reviewed 'Developing New Thinking Habits' in response to distorted thinking. Clinician challenged clients to use a skill to make the moment manageable, not perfect and focus on asking 'how' instead of 'why' questions to problem solve rather  than 'get stuck' in emotional mind. Clinician and group members practiced STOP skill and reviewed Tips for Breaking a Cycle.   Summary: Client checked in with reported sobriety date of 01/25/21 reporting 53 days sober. Client explained absence previous week related to depression and motivation. Client discussed with group distorted thinking related to us/them severity of alcohol use as well as possible ability to drink about which she was able to address. Client continues to attend meetings but still goes to bed early to avoid difficult times of evening rather than attending extra meeting as suggested. Client showed progress toward goal of maintained sobriety AEB no use since previous session despite craving thoughts. Client was both receptive and defensive relating psycho-education to personal thought/behavior patterns. Client will be going on vacation and provided plans for risk reduction and was receptive to feedback from group members.   Family Program: Family present? No   Name of family member(s): NA  UDS collected: Yes Results: pending  AA/NA attended?: Yes5   Sponsor?: No   Harlon Ditty, LCSW

## 2021-03-21 ENCOUNTER — Encounter (HOSPITAL_COMMUNITY): Payer: Medicare Other

## 2021-03-22 ENCOUNTER — Encounter (HOSPITAL_COMMUNITY): Payer: Medicare Other

## 2021-03-26 ENCOUNTER — Ambulatory Visit (HOSPITAL_COMMUNITY): Payer: Medicare Other | Admitting: Medical

## 2021-03-26 NOTE — Progress Notes (Signed)
Patient ID: Tiffany Mueller, female   DOB: April 04, 1956, 65 y.o.   MRN: [id number] No show for CD IOP

## 2021-03-28 ENCOUNTER — Encounter (HOSPITAL_COMMUNITY): Payer: Medicare Other

## 2021-03-29 ENCOUNTER — Encounter (HOSPITAL_COMMUNITY): Payer: Medicare Other

## 2021-04-04 ENCOUNTER — Encounter (HOSPITAL_COMMUNITY): Payer: Self-pay | Admitting: Medical

## 2021-04-04 ENCOUNTER — Ambulatory Visit (HOSPITAL_COMMUNITY): Payer: Medicare Other | Admitting: Medical

## 2021-04-04 NOTE — Progress Notes (Signed)
Patient ID: Tiffany Mueller, female   DOB: 1955/04/12, 65 y.o.   MRN: [id number] Patient failed to return to group as scheduled .Failed to take UDS as ar esult. Pt will be discharged to higher level of care when Counselor can contact.

## 2021-04-05 ENCOUNTER — Encounter (HOSPITAL_COMMUNITY): Payer: Medicare Other

## 2021-04-10 ENCOUNTER — Telehealth (HOSPITAL_COMMUNITY): Payer: Self-pay | Admitting: Licensed Clinical Social Worker

## 2021-04-10 NOTE — Telephone Encounter (Signed)
Completed.

## 2021-04-11 ENCOUNTER — Other Ambulatory Visit (HOSPITAL_COMMUNITY): Payer: Medicare Other | Attending: Psychiatry | Admitting: Medical

## 2021-04-11 ENCOUNTER — Other Ambulatory Visit: Payer: Self-pay

## 2021-04-11 ENCOUNTER — Other Ambulatory Visit (HOSPITAL_COMMUNITY): Payer: Self-pay | Admitting: Medical

## 2021-04-11 DIAGNOSIS — Z9884 Bariatric surgery status: Secondary | ICD-10-CM

## 2021-04-11 DIAGNOSIS — M810 Age-related osteoporosis without current pathological fracture: Secondary | ICD-10-CM | POA: Insufficient documentation

## 2021-04-11 DIAGNOSIS — F331 Major depressive disorder, recurrent, moderate: Secondary | ICD-10-CM

## 2021-04-11 DIAGNOSIS — F5081 Binge eating disorder: Secondary | ICD-10-CM

## 2021-04-11 DIAGNOSIS — I10 Essential (primary) hypertension: Secondary | ICD-10-CM

## 2021-04-11 DIAGNOSIS — Z90710 Acquired absence of both cervix and uterus: Secondary | ICD-10-CM | POA: Insufficient documentation

## 2021-04-11 DIAGNOSIS — Z8659 Personal history of other mental and behavioral disorders: Secondary | ICD-10-CM

## 2021-04-11 DIAGNOSIS — J40 Bronchitis, not specified as acute or chronic: Secondary | ICD-10-CM | POA: Insufficient documentation

## 2021-04-11 DIAGNOSIS — Z8739 Personal history of other diseases of the musculoskeletal system and connective tissue: Secondary | ICD-10-CM

## 2021-04-11 DIAGNOSIS — Z62810 Personal history of physical and sexual abuse in childhood: Secondary | ICD-10-CM | POA: Insufficient documentation

## 2021-04-11 DIAGNOSIS — F17210 Nicotine dependence, cigarettes, uncomplicated: Secondary | ICD-10-CM

## 2021-04-11 DIAGNOSIS — T7492XS Unspecified child maltreatment, confirmed, sequela: Secondary | ICD-10-CM

## 2021-04-11 DIAGNOSIS — Z8719 Personal history of other diseases of the digestive system: Secondary | ICD-10-CM

## 2021-04-11 DIAGNOSIS — F909 Attention-deficit hyperactivity disorder, unspecified type: Secondary | ICD-10-CM | POA: Insufficient documentation

## 2021-04-11 DIAGNOSIS — F1994 Other psychoactive substance use, unspecified with psychoactive substance-induced mood disorder: Secondary | ICD-10-CM | POA: Insufficient documentation

## 2021-04-11 DIAGNOSIS — F1020 Alcohol dependence, uncomplicated: Secondary | ICD-10-CM

## 2021-04-11 DIAGNOSIS — K56609 Unspecified intestinal obstruction, unspecified as to partial versus complete obstruction: Secondary | ICD-10-CM | POA: Insufficient documentation

## 2021-04-11 DIAGNOSIS — F10982 Alcohol use, unspecified with alcohol-induced sleep disorder: Secondary | ICD-10-CM

## 2021-04-11 DIAGNOSIS — F4312 Post-traumatic stress disorder, chronic: Secondary | ICD-10-CM | POA: Insufficient documentation

## 2021-04-11 DIAGNOSIS — Z79899 Other long term (current) drug therapy: Secondary | ICD-10-CM | POA: Insufficient documentation

## 2021-04-11 DIAGNOSIS — F10282 Alcohol dependence with alcohol-induced sleep disorder: Secondary | ICD-10-CM | POA: Insufficient documentation

## 2021-04-11 NOTE — Progress Notes (Addendum)
Patient ID: Tiffany Mueller, female   DOB: 08/22/1955, 66 y.o.   MRN: [id number] Pt returned from "vacation" 1 week after scheduled return.Counselor spoke with her about her absences and obtained a UDS which was + again for Benzos. It was anticipated she would be discharged to a higher level of care but Counselor would like to try a Behavioral contract first before doing this as she is a positive contributor to group. She will not be afforded any further abscences or + UDS. Will FU with her tomorrow for Provider visit a she meets with Counselor after group today to discuss Behavioral Contract if she agrees.If not then will proceed with discharge plan.

## 2021-04-12 ENCOUNTER — Encounter (HOSPITAL_COMMUNITY): Payer: Self-pay | Admitting: Medical

## 2021-04-12 ENCOUNTER — Other Ambulatory Visit (HOSPITAL_COMMUNITY): Payer: Medicare Other | Admitting: Medical

## 2021-04-12 DIAGNOSIS — Z8719 Personal history of other diseases of the digestive system: Secondary | ICD-10-CM

## 2021-04-12 DIAGNOSIS — F10282 Alcohol dependence with alcohol-induced sleep disorder: Secondary | ICD-10-CM | POA: Diagnosis not present

## 2021-04-12 DIAGNOSIS — F1020 Alcohol dependence, uncomplicated: Secondary | ICD-10-CM

## 2021-04-12 DIAGNOSIS — F17210 Nicotine dependence, cigarettes, uncomplicated: Secondary | ICD-10-CM

## 2021-04-12 DIAGNOSIS — F909 Attention-deficit hyperactivity disorder, unspecified type: Secondary | ICD-10-CM | POA: Diagnosis not present

## 2021-04-12 DIAGNOSIS — F4312 Post-traumatic stress disorder, chronic: Secondary | ICD-10-CM

## 2021-04-12 DIAGNOSIS — I10 Essential (primary) hypertension: Secondary | ICD-10-CM

## 2021-04-12 DIAGNOSIS — Z9884 Bariatric surgery status: Secondary | ICD-10-CM | POA: Diagnosis not present

## 2021-04-12 DIAGNOSIS — F331 Major depressive disorder, recurrent, moderate: Secondary | ICD-10-CM | POA: Diagnosis not present

## 2021-04-12 DIAGNOSIS — F5081 Binge eating disorder: Secondary | ICD-10-CM | POA: Diagnosis not present

## 2021-04-12 DIAGNOSIS — Z8739 Personal history of other diseases of the musculoskeletal system and connective tissue: Secondary | ICD-10-CM

## 2021-04-12 DIAGNOSIS — T7492XS Unspecified child maltreatment, confirmed, sequela: Secondary | ICD-10-CM

## 2021-04-12 DIAGNOSIS — K56609 Unspecified intestinal obstruction, unspecified as to partial versus complete obstruction: Secondary | ICD-10-CM | POA: Diagnosis not present

## 2021-04-12 DIAGNOSIS — Z8659 Personal history of other mental and behavioral disorders: Secondary | ICD-10-CM

## 2021-04-12 DIAGNOSIS — Z90710 Acquired absence of both cervix and uterus: Secondary | ICD-10-CM

## 2021-04-12 DIAGNOSIS — J40 Bronchitis, not specified as acute or chronic: Secondary | ICD-10-CM | POA: Diagnosis not present

## 2021-04-12 DIAGNOSIS — F1994 Other psychoactive substance use, unspecified with psychoactive substance-induced mood disorder: Secondary | ICD-10-CM

## 2021-04-12 DIAGNOSIS — M810 Age-related osteoporosis without current pathological fracture: Secondary | ICD-10-CM | POA: Diagnosis not present

## 2021-04-12 DIAGNOSIS — Z62810 Personal history of physical and sexual abuse in childhood: Secondary | ICD-10-CM | POA: Diagnosis not present

## 2021-04-12 DIAGNOSIS — F10982 Alcohol use, unspecified with alcohol-induced sleep disorder: Secondary | ICD-10-CM

## 2021-04-12 DIAGNOSIS — Z79899 Other long term (current) drug therapy: Secondary | ICD-10-CM | POA: Diagnosis not present
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
## 2021-04-16 ENCOUNTER — Other Ambulatory Visit: Payer: Self-pay

## 2021-04-16 ENCOUNTER — Other Ambulatory Visit (HOSPITAL_COMMUNITY): Payer: Medicare Other

## 2021-04-18 ENCOUNTER — Other Ambulatory Visit: Payer: Self-pay

## 2021-04-18 ENCOUNTER — Other Ambulatory Visit (HOSPITAL_COMMUNITY): Payer: Medicare Other

## 2021-04-19 ENCOUNTER — Other Ambulatory Visit: Payer: Self-pay

## 2021-04-19 ENCOUNTER — Other Ambulatory Visit (HOSPITAL_COMMUNITY): Payer: Medicare Other

## 2021-04-23 ENCOUNTER — Other Ambulatory Visit: Payer: Self-pay

## 2021-04-23 ENCOUNTER — Other Ambulatory Visit (HOSPITAL_COMMUNITY): Payer: Medicare Other

## 2021-04-25 ENCOUNTER — Other Ambulatory Visit (HOSPITAL_COMMUNITY): Payer: Medicare Other | Admitting: Licensed Clinical Social Worker

## 2021-04-25 ENCOUNTER — Other Ambulatory Visit: Payer: Self-pay

## 2021-04-25 DIAGNOSIS — F1020 Alcohol dependence, uncomplicated: Secondary | ICD-10-CM

## 2021-04-25 DIAGNOSIS — F1994 Other psychoactive substance use, unspecified with psychoactive substance-induced mood disorder: Secondary | ICD-10-CM

## 2021-04-26 ENCOUNTER — Other Ambulatory Visit (HOSPITAL_COMMUNITY): Payer: Medicare Other | Admitting: Licensed Clinical Social Worker

## 2021-04-26 ENCOUNTER — Other Ambulatory Visit: Payer: Self-pay

## 2021-04-26 DIAGNOSIS — F1020 Alcohol dependence, uncomplicated: Secondary | ICD-10-CM | POA: Diagnosis present

## 2021-04-26 DIAGNOSIS — I10 Essential (primary) hypertension: Secondary | ICD-10-CM | POA: Diagnosis not present

## 2021-04-26 DIAGNOSIS — F10282 Alcohol dependence with alcohol-induced sleep disorder: Secondary | ICD-10-CM | POA: Diagnosis not present

## 2021-04-26 DIAGNOSIS — J40 Bronchitis, not specified as acute or chronic: Secondary | ICD-10-CM | POA: Diagnosis not present

## 2021-04-26 DIAGNOSIS — F909 Attention-deficit hyperactivity disorder, unspecified type: Secondary | ICD-10-CM | POA: Diagnosis not present

## 2021-04-26 DIAGNOSIS — F17210 Nicotine dependence, cigarettes, uncomplicated: Secondary | ICD-10-CM | POA: Diagnosis not present

## 2021-04-26 DIAGNOSIS — Z79899 Other long term (current) drug therapy: Secondary | ICD-10-CM | POA: Diagnosis not present

## 2021-04-26 DIAGNOSIS — Z62810 Personal history of physical and sexual abuse in childhood: Secondary | ICD-10-CM | POA: Diagnosis not present

## 2021-04-26 DIAGNOSIS — F331 Major depressive disorder, recurrent, moderate: Secondary | ICD-10-CM | POA: Diagnosis not present

## 2021-04-26 DIAGNOSIS — M810 Age-related osteoporosis without current pathological fracture: Secondary | ICD-10-CM | POA: Diagnosis not present

## 2021-04-26 DIAGNOSIS — F1994 Other psychoactive substance use, unspecified with psychoactive substance-induced mood disorder: Secondary | ICD-10-CM

## 2021-04-26 DIAGNOSIS — Z90710 Acquired absence of both cervix and uterus: Secondary | ICD-10-CM | POA: Diagnosis not present

## 2021-04-26 DIAGNOSIS — Z9884 Bariatric surgery status: Secondary | ICD-10-CM | POA: Diagnosis not present

## 2021-04-26 DIAGNOSIS — F4312 Post-traumatic stress disorder, chronic: Secondary | ICD-10-CM | POA: Diagnosis not present

## 2021-04-26 DIAGNOSIS — F5081 Binge eating disorder: Secondary | ICD-10-CM | POA: Diagnosis not present

## 2021-04-26 DIAGNOSIS — K56609 Unspecified intestinal obstruction, unspecified as to partial versus complete obstruction: Secondary | ICD-10-CM | POA: Diagnosis not present

## 2021-04-30 ENCOUNTER — Other Ambulatory Visit: Payer: Self-pay

## 2021-04-30 ENCOUNTER — Other Ambulatory Visit (HOSPITAL_COMMUNITY): Payer: Medicare Other

## 2021-04-30 NOTE — Progress Notes (Signed)
° °  THERAPIST PROGRESS NOTE  Session Time: 1-3pm; individual session linked with CDIOP program.  Participation Level: Active  Behavioral Response: NeatAlertEuthymic  Type of Therapy: Individual Therapy  Treatment Goals addressed: Coping and Diagnosis: achieve/maintain sobriety from all mood/mind altering substances 7/7 days weekly. Increase use of distress tolerance skills to at least 1 time daily at least 4 days weekly.  Interventions: CBT and Supportive  Summary: Tiffany Mueller is a 66 y.o. female who presents with substance use disorder and substance induced mood disorder. Client processed frustration with communication with husband and lack of autonomy with decision making. Client discussed patterns of isolation and need for socialization. Client acknowledged others cannot meet needs if she does not express them and possibility of resentments and their effect on recovery. Client practiced I-statements in session to identify and express needs. Client showed progress toward goal AEB reported sobriety date of 01/25/21.  Suicidal/Homicidal: No  Therapist Response: Clinician checked in with client, assessed for SI/HI/psychosis and overall level of functioning. Clinician processed with client use of I-statements and expectations turning into resentments. Clinician presented I-statement options and practiced in session.  Plan: Return again in tomorrow for group.  Diagnosis: Axis I: Substance Abuse and Substance Induced Mood Disorder    Micheline Chapman, LCSW, LCAS 04/25/21

## 2021-04-30 NOTE — Progress Notes (Signed)
° ° °  Daily Group Progress Note  Program: CD-IOP   Group Time: 1pm-2:30pm Participation Level: Active Behavioral Response: Appropriate Type of Therapy: Process Group Topic: Clinician checked in with group members, assessing for SI/HI/psychosis and overall level of functioning, including cravings or triggers and coping skills used to deal with uncomfortable feelings. Clinician inquired about attendance at community support meetings and connection with sober support system. Clinician and group members discussed what went well and challenges to sobriety. Clinician facilitated group processing of triggers and relapses since previous session and common struggles in early sobriety, such as wanting the instant gratification, acceptance of physical/mental/emotional states sober in comparison to expectations and related disappointments or resentments.  Clinician and group members read and processed AA daily reflection  and NA JFT.   Group Time: 2:30pm-4pm Participation Level: Active Behavioral Response: Appropriate Type of Therapy: Psycho-education Group Topic: Clinician presented the psycho-educational topic of Adult Children of Alcoholics (ACOA). Clinician reviewed with clients the 'Laundry List' and common thought and behavior patterns of families with substance use in the home. Clinician and group members discussed how this affected believes/behaviors in work, intimate and interpersonal relationships. Clinician and group members reviewed how to address distorted thinking in relationships with family members. Clinician provided clients with '10 Things Adult Children of Addicts Wants You To Know. Clinician inquired about self care activity planned before next session.   Summary: Client checked in with reported sobriety date of 01/25/21 showing progress toward goal of achieving and maintaining sobriety from all mind/mood altering substances 7/7 days weekly. Client attended 1 AA meeting since last session  showing progress toward goal of building sober support system AEB attending at least 4 AA/NA meetings weekly. Client was receptive to psycho-education, identifying patterns in personal behavior and processing concerns about effect of her behavior on children. Client self care to support recovery included attending at least 1 meeting before group.   Family Program: Family present? No   Name of family member(s): NA  UDS collected: No Results: prescribed medications only from 04/23/21 UDS  AA/NA attended?: Yes  Sponsor?: Yes   Micheline Chapman, LCSW, LCAS

## 2021-05-02 ENCOUNTER — Other Ambulatory Visit: Payer: Self-pay

## 2021-05-02 ENCOUNTER — Encounter (HOSPITAL_COMMUNITY): Payer: Self-pay | Admitting: Medical

## 2021-05-02 ENCOUNTER — Other Ambulatory Visit (HOSPITAL_COMMUNITY): Payer: Medicare Other | Admitting: Licensed Clinical Social Worker

## 2021-05-02 VITALS — Ht 62.0 in | Wt 150.4 lb

## 2021-05-02 DIAGNOSIS — Z8659 Personal history of other mental and behavioral disorders: Secondary | ICD-10-CM

## 2021-05-02 DIAGNOSIS — Z9884 Bariatric surgery status: Secondary | ICD-10-CM

## 2021-05-02 DIAGNOSIS — F10982 Alcohol use, unspecified with alcohol-induced sleep disorder: Secondary | ICD-10-CM

## 2021-05-02 DIAGNOSIS — F10282 Alcohol dependence with alcohol-induced sleep disorder: Secondary | ICD-10-CM | POA: Diagnosis not present

## 2021-05-02 DIAGNOSIS — T7492XS Unspecified child maltreatment, confirmed, sequela: Secondary | ICD-10-CM

## 2021-05-02 DIAGNOSIS — Z8719 Personal history of other diseases of the digestive system: Secondary | ICD-10-CM

## 2021-05-02 DIAGNOSIS — F1020 Alcohol dependence, uncomplicated: Secondary | ICD-10-CM

## 2021-05-02 DIAGNOSIS — I10 Essential (primary) hypertension: Secondary | ICD-10-CM

## 2021-05-02 DIAGNOSIS — F4312 Post-traumatic stress disorder, chronic: Secondary | ICD-10-CM

## 2021-05-02 DIAGNOSIS — F17210 Nicotine dependence, cigarettes, uncomplicated: Secondary | ICD-10-CM

## 2021-05-02 DIAGNOSIS — Z90710 Acquired absence of both cervix and uterus: Secondary | ICD-10-CM

## 2021-05-02 DIAGNOSIS — F1994 Other psychoactive substance use, unspecified with psychoactive substance-induced mood disorder: Secondary | ICD-10-CM

## 2021-05-02 DIAGNOSIS — F5081 Binge eating disorder: Secondary | ICD-10-CM

## 2021-05-02 DIAGNOSIS — F331 Major depressive disorder, recurrent, moderate: Secondary | ICD-10-CM

## 2021-05-02 DIAGNOSIS — Z8739 Personal history of other diseases of the musculoskeletal system and connective tissue: Secondary | ICD-10-CM

## 2021-05-02 DIAGNOSIS — F50819 Binge eating disorder, unspecified: Secondary | ICD-10-CM

## 2021-05-02 MED ORDER — NALTREXONE HCL 50 MG PO TABS: 50.0000 mg | ORAL_TABLET | Freq: Every day | ORAL | 1 refills | Status: DC
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
## 2021-05-03 ENCOUNTER — Encounter (HOSPITAL_COMMUNITY): Payer: Medicare Other

## 2021-05-03 ENCOUNTER — Telehealth (HOSPITAL_COMMUNITY): Payer: Self-pay | Admitting: Licensed Clinical Social Worker

## 2021-05-03 NOTE — Progress Notes (Signed)
° ° °  Daily Group Progress Note  Program: CD-IOP   Group Time: 1pm-2:30pm Participation Level: Active Behavioral Response: Appropriate and Sharing Type of Therapy: Process Group Topic: Clinician checked in with group members, assessing for SI/HI/psychosis and overall level of functioning including difficulties with cravings or relapse. Clinician and group members processed 'highs and lows' since last group. Group members were provided time to process challenges to recovery and receive feedback from group members. Clinician and group members read AA Daily Reflection and Just for Today. Clinician and group members completed mindfulness activity.   Group Time: 2:30pm-4pm Participation Level: Active Behavioral Response: Appropriate and Sharing Type of Therapy: Psychoeducational Group Topic  Clinician reviewed with clients the CBT triangle and connection of thoughts, feelings, and behaviors. Clinician and group members practiced in session identifying differences in thoughts, feelings, and behaviors in session and practiced creating alternative thoughts. Clinician presented the topic of resentment and its possible effect on relapse in recovery. Clinician and group members discussed situations leading to resentment, additional feelings, thoughts, and behaviors related to resentment. Clinician and group members identified core believes and processed how a violation of these by self or others can lead to guilt or resentment. Clinician and group members processed violating boundaries is more likely when actively using substances. Clinician provided clients with homework to review forgiveness section.   Summary: Client checked in, did not endorse SI/HI, did not appear psychotic or intoxicated at the time of session. Client processed with group difficulties with boundaries and lack of control related to her son's addiction and responses to her. Client was able to identify pattern of behaviors from others in  her life supporting resentment as protective factor and core beliefs supporting negative self talk. Client was able to acknowledge distorted thinking around resentment and family's behaviors based on previously reviewed material. Client showed progress toward goal of maintained sobriety 7/7 days weekly AEB maintained sobriety date of 01/25/21 and building sober support system AEB attendance at daily AA meetings doing the week.   Family Program: Family present? No   Name of family member(s):   UDS collected: No Results: pending  AA/NA attended?: Yes  Sponsor?: Yes   Olegario Messier, LCSW

## 2021-05-07 ENCOUNTER — Other Ambulatory Visit (HOSPITAL_COMMUNITY): Payer: Medicare Other | Admitting: Licensed Clinical Social Worker

## 2021-05-07 ENCOUNTER — Other Ambulatory Visit: Payer: Self-pay

## 2021-05-07 DIAGNOSIS — F1994 Other psychoactive substance use, unspecified with psychoactive substance-induced mood disorder: Secondary | ICD-10-CM

## 2021-05-07 DIAGNOSIS — F1020 Alcohol dependence, uncomplicated: Secondary | ICD-10-CM

## 2021-05-07 DIAGNOSIS — F10282 Alcohol dependence with alcohol-induced sleep disorder: Secondary | ICD-10-CM | POA: Diagnosis not present

## 2021-05-08 NOTE — Progress Notes (Signed)
° ° °  Daily Group Progress Note  Program: CD-IOP   Group Time: 1-230 Participation Level: Active Behavioral Response: Appropriate and Sharing Type of Therapy: Process Group Topic: Clinician checked in with clients, assessing for SI/HI/psychosis and overall level of functioning, including sobriety dates and number of support groups attended since last group. Clinician and group members processed highlights and challenges since last group session. Clients processed barriers to sobriety. Clinician and group members read and processed AA Daily Reflection and NA Just for Today in relation to current space in recovery. Clinician and group members worked with identifying specific events paired with specific emotions. Clinician and group members processed the difficulty identifying events to link with "positive" emotions but ease of "difficult" emotions. Clinician encouraged clients to be mindful of labeling emotions.   Group Time: 230-4 Participation Level: Active Behavioral Response: Appropriate Type of Therapy: Psycho-education Group Topic: Clinician provided mindfulness activity. Clinician presented [employer] and group members reviewed cognitive triangle and connection of thoughts, feelings, and behaviors. Clinician facilitated discussion on where feelings are physically felt in the body and group completed body mapping activity. Group discussed needing different skills to address different emotions or body sensations. Clinician reviewed butterfly tapping and pretzel skills for grounding as well as the importance of sitting with an emotion for several minutes rather than automatic distraction, similar to addictive behavior patterns. Clinician inquired an activity to be completed to support recovery prior to next session.     Summary: Client checked in showing progress toward goal of maintained sobriety date 7/7 days weekly AEB reported sobriety date maintained of 01/25/21. Client  verbalized difficulty separating and identifying emotions overall, ongoing since childhood. Client identified not being comfortable expressing most emotions in public which was modeled in her home growing up. Client was receptive to psycho-education and participated in mindfulness activity.   Family Program: Family present? No   Name of family member(s): NA  UDS collected: Yes Results: pending  AA/NA attended?: Yes  Sponsor?: Yes   Micheline Chapman, LCSW, LCAS

## 2021-05-09 ENCOUNTER — Other Ambulatory Visit (HOSPITAL_COMMUNITY): Payer: Medicare Other | Attending: Psychiatry | Admitting: Licensed Clinical Social Worker

## 2021-05-09 DIAGNOSIS — F5081 Binge eating disorder: Secondary | ICD-10-CM | POA: Insufficient documentation

## 2021-05-09 DIAGNOSIS — F1021 Alcohol dependence, in remission: Secondary | ICD-10-CM | POA: Diagnosis present

## 2021-05-09 DIAGNOSIS — F1994 Other psychoactive substance use, unspecified with psychoactive substance-induced mood disorder: Secondary | ICD-10-CM | POA: Insufficient documentation

## 2021-05-09 DIAGNOSIS — Z79899 Other long term (current) drug therapy: Secondary | ICD-10-CM | POA: Insufficient documentation

## 2021-05-09 DIAGNOSIS — Z8739 Personal history of other diseases of the musculoskeletal system and connective tissue: Secondary | ICD-10-CM | POA: Diagnosis not present

## 2021-05-09 DIAGNOSIS — F10282 Alcohol dependence with alcohol-induced sleep disorder: Secondary | ICD-10-CM | POA: Insufficient documentation

## 2021-05-09 DIAGNOSIS — F331 Major depressive disorder, recurrent, moderate: Secondary | ICD-10-CM | POA: Diagnosis not present

## 2021-05-09 DIAGNOSIS — I10 Essential (primary) hypertension: Secondary | ICD-10-CM | POA: Diagnosis not present

## 2021-05-09 DIAGNOSIS — F17210 Nicotine dependence, cigarettes, uncomplicated: Secondary | ICD-10-CM | POA: Diagnosis not present

## 2021-05-09 DIAGNOSIS — F1020 Alcohol dependence, uncomplicated: Secondary | ICD-10-CM

## 2021-05-09 DIAGNOSIS — F4312 Post-traumatic stress disorder, chronic: Secondary | ICD-10-CM | POA: Insufficient documentation

## 2021-05-09 DIAGNOSIS — Z90710 Acquired absence of both cervix and uterus: Secondary | ICD-10-CM | POA: Diagnosis not present

## 2021-05-09 DIAGNOSIS — Z8719 Personal history of other diseases of the digestive system: Secondary | ICD-10-CM | POA: Diagnosis not present

## 2021-05-09 DIAGNOSIS — F1321 Sedative, hypnotic or anxiolytic dependence, in remission: Secondary | ICD-10-CM | POA: Insufficient documentation

## 2021-05-09 DIAGNOSIS — Z813 Family history of other psychoactive substance abuse and dependence: Secondary | ICD-10-CM | POA: Diagnosis not present

## 2021-05-09 DIAGNOSIS — Z9884 Bariatric surgery status: Secondary | ICD-10-CM | POA: Diagnosis not present

## 2021-05-09 DIAGNOSIS — F341 Dysthymic disorder: Secondary | ICD-10-CM | POA: Insufficient documentation

## 2021-05-09 DIAGNOSIS — F909 Attention-deficit hyperactivity disorder, unspecified type: Secondary | ICD-10-CM | POA: Diagnosis not present

## 2021-05-10 ENCOUNTER — Other Ambulatory Visit: Payer: Self-pay

## 2021-05-10 ENCOUNTER — Other Ambulatory Visit (HOSPITAL_COMMUNITY): Payer: Medicare Other | Admitting: Licensed Clinical Social Worker

## 2021-05-10 DIAGNOSIS — F1020 Alcohol dependence, uncomplicated: Secondary | ICD-10-CM

## 2021-05-10 DIAGNOSIS — F1994 Other psychoactive substance use, unspecified with psychoactive substance-induced mood disorder: Secondary | ICD-10-CM

## 2021-05-10 DIAGNOSIS — F1021 Alcohol dependence, in remission: Secondary | ICD-10-CM | POA: Diagnosis not present

## 2021-05-10 NOTE — Progress Notes (Signed)
° ° °  Daily Group Progress Note  Program: CD-IOP   Group Time: 1pm-2pm Participation Level: Active Behavioral Response: Appropriate Type of Therapy: Psycho-education Group Topic: Psychoeducational presentation co-facilitated by Cleta Alberts. from [place]. STI and HIV psychoeducation was provided to clients. Co-facilitator provided local resources for testing and treatment. Clients were provided with time to ask questions and discuss local resources.   Group Time: 2-4 Participation Level: Active Behavioral Response: Appropriate and Sharing Type of Therapy: Process Group Topic: Clinician checked in with clients, assessing for SI/HI/psychosis and overall level of functioning including cravings, barriers to sobriety, and highlights when skills were successfully used. Clinician and group processed Daily Reflection and Just For Today with a focus on sanity and hardships. Clinician processed with group transition in group leaders. Clinician provided guided visualization meditation. Clinician and clients reviewed and processed 6 Common Fears in early recovery.   Summary: Client checked in showing progress toward goal of maintained sobriety AEB sobriety date of 01/25/21 and building sober support system AEB attending 1 AA meeting since previous session. Client processed changes desired in boundaries with family members and what is needed to support sustained changes. Client was receptive to psycho-education and discussed not seeing addiction as a "disease" or "illness" but poor habit choices.   Family Program: Family present? No   Name of family member(s): NA  UDS collected: No Results: pending  AA/NA attended?: Yes  Sponsor?: Yes   Micheline Chapman, LCSW, LCAS

## 2021-05-11 ENCOUNTER — Other Ambulatory Visit: Payer: Self-pay

## 2021-05-14 ENCOUNTER — Other Ambulatory Visit (HOSPITAL_COMMUNITY): Payer: Medicare Other | Admitting: Licensed Clinical Social Worker

## 2021-05-14 ENCOUNTER — Encounter (HOSPITAL_COMMUNITY): Payer: Self-pay | Admitting: Medical

## 2021-05-14 ENCOUNTER — Other Ambulatory Visit: Payer: Self-pay

## 2021-05-14 DIAGNOSIS — T7492XS Unspecified child maltreatment, confirmed, sequela: Secondary | ICD-10-CM

## 2021-05-14 DIAGNOSIS — F10982 Alcohol use, unspecified with alcohol-induced sleep disorder: Secondary | ICD-10-CM

## 2021-05-14 DIAGNOSIS — Z8719 Personal history of other diseases of the digestive system: Secondary | ICD-10-CM

## 2021-05-14 DIAGNOSIS — F331 Major depressive disorder, recurrent, moderate: Secondary | ICD-10-CM

## 2021-05-14 DIAGNOSIS — F1994 Other psychoactive substance use, unspecified with psychoactive substance-induced mood disorder: Secondary | ICD-10-CM

## 2021-05-14 DIAGNOSIS — F5081 Binge eating disorder: Secondary | ICD-10-CM

## 2021-05-14 DIAGNOSIS — F1021 Alcohol dependence, in remission: Secondary | ICD-10-CM | POA: Diagnosis not present

## 2021-05-14 DIAGNOSIS — F4312 Post-traumatic stress disorder, chronic: Secondary | ICD-10-CM

## 2021-05-14 DIAGNOSIS — F17210 Nicotine dependence, cigarettes, uncomplicated: Secondary | ICD-10-CM

## 2021-05-14 DIAGNOSIS — Z9884 Bariatric surgery status: Secondary | ICD-10-CM

## 2021-05-14 DIAGNOSIS — Z90710 Acquired absence of both cervix and uterus: Secondary | ICD-10-CM

## 2021-05-14 DIAGNOSIS — F1020 Alcohol dependence, uncomplicated: Secondary | ICD-10-CM

## 2021-05-14 DIAGNOSIS — Z8739 Personal history of other diseases of the musculoskeletal system and connective tissue: Secondary | ICD-10-CM

## 2021-05-14 DIAGNOSIS — Z8659 Personal history of other mental and behavioral disorders: Secondary | ICD-10-CM

## 2021-05-14 DIAGNOSIS — I10 Essential (primary) hypertension: Secondary | ICD-10-CM

## 2021-05-14 NOTE — Progress Notes (Signed)
pT REQUESTING STATUS OF nALTREXONE RX-6 MO RX SENT 05/01/2021

## 2021-05-15 NOTE — Progress Notes (Signed)
° ° °  Daily Group Progress Note  Program: CD-IOP   Group Time: 1pm-2:30pm Participation Level: Active Behavioral Response: Appropriate Type of Therapy: Process Group Topic: Clinician met with clients, assessing for SI/HI/psychosis and overall level of functioning including attendance of recovery meetings and relapse or challenges to sobriety. Clinician and group members discussed highs and lows from previous days and reflection on topic from previous day. Clinician and group members read Daily Reflection and clinician facilitated discussion on fear and denial in recovery vs acceptance and willingness for change.     Group Time: 2:30pm-4pm Participation Level: Active Behavioral Response: Appropriate Type of Therapy: Psycho-education Group Topic: Clinician provided psycho-educational group on PAWS. Clinician provided supplemental video with discussion on common PAWS symptoms and skills to help with management. Clinician utilized Post Acute Withdrawal (PAW) Self-Evaluation developed by Patrina Levering. Clinician and group members discussed the importance of being aware and tracking symptoms during recovery. Clinician provided supplemental video of information related to PAWS, common triggers, and relaxation techniques for management of symptoms. Clinician provided exercises for mindfulness as an alternative to meditations.   Summary: Client checked in showing progress toward goal of maintained sobriety 7/7 days weekly AEB self reported sobriety date of 01/25/21. Client showed progress toward building a sober support system AEB attendance at daily AA meetings however has not engaged with group members much outside of sponsor and remains somewhat isolated. Client processed with group ongoing stressors with family and skills used to maintain personal recovery. Client processed feelings related to supporting others by supporting her own recovery. Client was receptive to psycho-education.   Family  Program: Family present? No   Name of family member(s): NA  UDS collected: No Results: UDS from 2/30/23 positive for prescribed medications only  AA/NA attended?: Yes  Sponsor?: Yes   Micheline Chapman, LCSW, LCAS

## 2021-05-15 NOTE — Progress Notes (Signed)
° ° °  Daily Group Progress Note  Program: CD-IOP   Group Time: 1pm-2:30pm Participation Level: Active Behavioral Response: Appropriate Type of Therapy: Process Group Topic: Clinician checked in with group members, assessing for SI/HI/psychosis and overall level of functioning including relapse and barriers to recovery. Clinician and group members discussed highlights and struggles since last group related to maintaining sobriety including identified triggers and responses. Clinician and group members processed options for dealing with difficult situations. Clinician and group members reviewed recent stressful encounters and planned for upcoming stressors by addressing possible changes in thinking, changes in situations, acceptance, or continued suffering. Clinician and group members read and processed AA Daily Reflection and NA Just for today in relation to current space in recovery.    Group Time: 2:30pm-4pm Participation Level: Active Behavioral Response: Appropriate Type of Therapy: Psycho-education Group Topic: Psycho-educational group co-facilitated by wellness director focused on managing stress and self-care in recovery. Facilitator and group members discussed presented materials around mental and emotional benefits of proper sleep hygiene, diet and exercise. Group members discussed a change willing to make to improve an area of self-care (physical, psychological, emotional, spiritual, relationship, professional) and overall balance for supporting recovery. Clinician inquired about self care activity to be completed prior to next session.   Summary: Client checked in showing progress toward goal of maintained sobriety 7/7 days weekly AEB reported sobriety date of 01/25/21. Client required some support on utilizing sober support community AEB maintained contact with sponsor but decrease in meeting attendance and planned attendance based on mood. Client acknowledged importance of meetings to  relapse prevention plan however processed stressors leading to her missing group. Clinician challenged client to avoid isolating behaviors previously identified as problematic. Client identified stressor with son continues as well as husband enabling son's behaviors. Client was receptive to psycho-education however commented on the effect of her ED on some behaviors.   Family Program: Family present? No   Name of family member(s): NA  UDS collected: Yes Results: pending  AA/NA attended?: No  Sponsor?: Yes   Olegario Messier, LCSW

## 2021-05-16 ENCOUNTER — Other Ambulatory Visit: Payer: Self-pay

## 2021-05-16 ENCOUNTER — Other Ambulatory Visit (HOSPITAL_COMMUNITY): Payer: Medicare Other | Admitting: Licensed Clinical Social Worker

## 2021-05-16 DIAGNOSIS — F1020 Alcohol dependence, uncomplicated: Secondary | ICD-10-CM

## 2021-05-16 DIAGNOSIS — F1021 Alcohol dependence, in remission: Secondary | ICD-10-CM | POA: Diagnosis not present

## 2021-05-16 DIAGNOSIS — F1994 Other psychoactive substance use, unspecified with psychoactive substance-induced mood disorder: Secondary | ICD-10-CM

## 2021-05-17 ENCOUNTER — Other Ambulatory Visit (HOSPITAL_COMMUNITY): Payer: Medicare Other | Admitting: Licensed Clinical Social Worker

## 2021-05-17 DIAGNOSIS — F1021 Alcohol dependence, in remission: Secondary | ICD-10-CM | POA: Diagnosis not present

## 2021-05-17 DIAGNOSIS — F1020 Alcohol dependence, uncomplicated: Secondary | ICD-10-CM

## 2021-05-17 NOTE — Progress Notes (Incomplete)
° ° °  Daily Group Progress Note  Program: {CHL AMB BH IOP/CDIOP Program Type:21022744}   Group Time: ***  Participation Level: {CHL AMB BH Group Participation:21022742}  Behavioral Response: {CHL AMB BH Group Behavior:21022743}  Type of Therapy: {CHL AMB BH Type of Therapy:21022741}  Topic: ***     Group Time: ***  Participation Level: {CHL AMB BH Group Participation:21022742}  Behavioral Response: {CHL AMB BH Group Behavior:21022743}  Type of Therapy: {CHL AMB BH Type of Therapy:21022741}  Topic: ***   Summary: ***   Family Program: Family present? {BHH YES OR NO:22294}   Name of family member(s): ***  UDS collected: {BHH YES OR NO:22294} Results: {Findings; urine drug screen:60936}  AA/NA attended?: {BHH YES OR NO:22294}{DAYS OF WEEK:22385}  Sponsor?: {BHH YES OR NO:22294}   [facility] CHEM        

## 2021-05-17 NOTE — Progress Notes (Signed)
° ° °  Daily Group Progress Note  Program: CD-IOP   Group Time: 1-230  Participation Level: Active  Behavioral Response: Appropriate  Type of Therapy: Process Group  Topic: Clinician checked in assessing for SI/HI/psychosis and overall level of functioning. Clients checked in with name, sobriety date, and number of community support meetings attended since last session. Clinician and group members processed what went well since last session and any challenges to the recovery process. Clinician utilized OARS to facilitate group discussions on identifying and coping with or avoiding known triggers. Clinician and group members reviewed distorted thinking which could lead to relapse. Clinician and group members read AA's Daily Reflection and NA's Just For Today with focus on openness to change, humility, and progress not perfection.      Group Time: 2:30p-4pm Participation Level: Active Behavioral Response: Appropriate and Sharing Type of Therapy: Psycho-education Group Topic: Clinician presented the topic of assertive communication. Clinician and group members reviewed characteristics of aggressive/passive/assertive communication styles. Clinician and group members discussed common communication roadblocks and previous examples of interference with communication, specifically related to others' interpretation of individual communication style while trying to get needs met. Clinician and group members reviewed Communication and Addiction with a focus on obstacles to positive communication. Clinician reviewed and practiced I-statements as skill as a strategy for effective communication. Clinician inquired about a self care activity to be completed prior to next group session.  Summary: Client checked in reporting progress toward goal of maintained sobriety 7/7 days weekly AEB reported sobriety date of 01/25/21. Client maintained progress toward goal of building sober support system AEB remaining  engaged in virtual [place] groups and keeping in contact with her sponsor. Client processed different communication styles with friends, sponsor, and family members. Client was receptive to feedback from group members around healthy communication  and suggestions for other ways to engage. Client self care plan is to attend a virtual meeting prior to next session.   Family Program: Family present? No   Name of family member(s): NA  UDS collected: No Results: pending  AA/NA attended?: Yes  Sponsor?: Yes   Micheline Chapman, LCSW, LCAS

## 2021-05-18 NOTE — Progress Notes (Signed)
° ° °  Daily Group Progress Note  Program: CD-IOP   Group Time: 1 pm to 2:30 pm  Participation Level: Active  Behavioral Response: Appropriate and sharing  Type of Therapy: Process Group  Topic: Clinician checked in with group members regarding things that have gone well in their recovery in addition to challenges to recovery. Clinician assesses how group members are doing in regards to depression using a SUDS scale from 1-10 with 1 being not depressed and 10 being severely depressed. Clinician has group members sign his Professional Disclosure Statement and invites questions. Clinician also discussed the reason that urine drug testing is used in SA treatment explaining that it is used to evaluate as to whether or not treatment is working as opposed to trying to shame the person in recovery.   Group Time: 2:30 pm to 4 pm  Participation Level: Active  Behavioral Response: Appropriate and sharing  Type of Therapy: Psycho-education Group  Topic: Clinician presents a module on Personal Boundaries explaining the common traits associated with rigid, porous, and health boundaries. Clinician covers the six types of boundaries eliciting feedback from members regarding what type of boundary they are able to maintain most successfully and the one that is most challenging for them. Clinician covers five examples of how to set boundaries as well as the common boundary myths that must be challenged so as to be able to accomplish this.   Summary: Tiffany Mueller presents for group noting that her depression at present is likely a 1 or 2; however, she is sad about the previous IOP group leader's leaving as well as her approaching end to coming to IOP next Monday. She denies any drug or alcohol use noting that once she ends IOP that she wants to get back on her Adderall. She says that she has not seen her son but that he wants to come over and talk. She says that she is planning on going out to her firepit when her  son does come over to avoid having to listen to her husband and son argue. She can identify that this situation causes her to feel angst and is praised for being able to not only name her feeling but practice self-care in withdrawing from a situation over which she is powerless to change. She concludes that her sobriety is more important than trying to control her husband's behavior. She attended a virtual [employer] meeting which made her realize that she does need in person meetings, so she plans on attending an in person meeting tomorrow. In the psychoeducation portion of group, she notes that the type of boundary with which she has the most difficulty is emotional boundaries; however, does bet regarding time boundaries. She recognizes that her fear of hurting others causes her to say no worries thus invalidating her own feelings and preventing her from always setting emotional boundaries when she should.    Family Program: Family present? No   Name of family member(s): NA  UDS collected: No Results: N/A  AA/NA attended?: Yes  Sponsor?: Yes   Tiffany Matas, MA, [place], [place], [facility]

## 2021-05-21 ENCOUNTER — Other Ambulatory Visit (HOSPITAL_COMMUNITY): Payer: Medicare Other | Admitting: Licensed Clinical Social Worker

## 2021-05-21 ENCOUNTER — Other Ambulatory Visit: Payer: Self-pay

## 2021-05-21 ENCOUNTER — Encounter (HOSPITAL_COMMUNITY): Payer: Self-pay | Admitting: Medical

## 2021-05-21 DIAGNOSIS — I10 Essential (primary) hypertension: Secondary | ICD-10-CM

## 2021-05-21 DIAGNOSIS — F1021 Alcohol dependence, in remission: Secondary | ICD-10-CM

## 2021-05-21 DIAGNOSIS — F1994 Other psychoactive substance use, unspecified with psychoactive substance-induced mood disorder: Secondary | ICD-10-CM

## 2021-05-21 DIAGNOSIS — F17210 Nicotine dependence, cigarettes, uncomplicated: Secondary | ICD-10-CM

## 2021-05-21 DIAGNOSIS — F4312 Post-traumatic stress disorder, chronic: Secondary | ICD-10-CM

## 2021-05-21 DIAGNOSIS — Z8719 Personal history of other diseases of the digestive system: Secondary | ICD-10-CM

## 2021-05-21 DIAGNOSIS — Z8659 Personal history of other mental and behavioral disorders: Secondary | ICD-10-CM

## 2021-05-21 DIAGNOSIS — Z90710 Acquired absence of both cervix and uterus: Secondary | ICD-10-CM

## 2021-05-21 DIAGNOSIS — F1321 Sedative, hypnotic or anxiolytic dependence, in remission: Secondary | ICD-10-CM

## 2021-05-21 DIAGNOSIS — T7492XS Unspecified child maltreatment, confirmed, sequela: Secondary | ICD-10-CM

## 2021-05-21 DIAGNOSIS — F5081 Binge eating disorder: Secondary | ICD-10-CM

## 2021-05-21 DIAGNOSIS — F10982 Alcohol use, unspecified with alcohol-induced sleep disorder: Secondary | ICD-10-CM

## 2021-05-21 DIAGNOSIS — Z8739 Personal history of other diseases of the musculoskeletal system and connective tissue: Secondary | ICD-10-CM

## 2021-05-21 DIAGNOSIS — F331 Major depressive disorder, recurrent, moderate: Secondary | ICD-10-CM

## 2021-05-21 DIAGNOSIS — Z9884 Bariatric surgery status: Secondary | ICD-10-CM
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
## 2021-05-21 NOTE — Progress Notes (Signed)
Daily Group Progress Note   Program: CD IOP     Group Time: 1 pm. to 2:30 pm   Participation Level: Active   Behavioral Response: Appropriate   Type of Therapy: Process   Topic: The therapist has members discuss what has gone well in their recovery including challenges to recovery. The therapist talks to members about the prospect of group moving from afternoons to mornings and being held on Mondays, Wednesdays, and Fridays as opposed to Monday, Wednesdays, and Thursdays. The therapist introduces a new group member to group and says goodbye to a member whose last day is today         Group Time: 2:30 pm to 4 pm   Participation Level: Active   Behavioral Response: Appropriate   Type of Therapy: Psychoeducational   Topic: The therapist presented Session 1: Stop the Cycle from the MATRIX model discussing what are triggers; educating clients on the trigger-thought-craving-use cycle and how to use thought stopping techniques to interrupt this cycle; and explaining to clients the importance of scheduling.      Summary: Bambi shares her experience with how she entered treatment with a new group member. She spends a lot of time talking about how non-alcoholic wine is one of her biggest challenges. She attended a [employer] party in which people were drinking. She drank two bottles of non-alcoholic wine versus her tea saying that she perhaps feels the need to drink the non-alcoholic wine to "fit in." She admits that she is afraid she will having cravings if she were to drink tea instead in such situations. She tested positive for a stimulant due to restarting her ADHD medication before ending group. She meets with her PCP tomorrow and has on-line therapy this week as part of her stepdown plan and is encouraged to resume AA meetings. Jacki Cones says that her biggest trigger is her son. She did not go to her fire pit as planned noting that there is a woman where she lives who drinks who she is trying  to avoid who is a potential trigger. Laurie's husband drinks so this is also a possible trigger. Jacki Cones is encouraged to schedule something to do during the times that she is isolating.      Family Program: Family present? No             Name of family member(s): N/A   UDS collected: No Results: positive for methamphetamines   AA/NA attended?: No Sponsor?: No     Myrna Blazer, MA, LCSW, LCMHC, LCAS

## 2021-05-23 ENCOUNTER — Encounter (HOSPITAL_COMMUNITY): Payer: Medicare Other

## 2021-05-23 ENCOUNTER — Other Ambulatory Visit: Payer: Self-pay | Admitting: Internal Medicine

## 2021-05-23 ENCOUNTER — Other Ambulatory Visit (HOSPITAL_COMMUNITY): Payer: Self-pay | Admitting: Medical

## 2021-05-23 DIAGNOSIS — M810 Age-related osteoporosis without current pathological fracture: Secondary | ICD-10-CM

## 2021-05-23 MED ORDER — NALTREXONE HCL 50 MG PO TABS: ORAL_TABLET | ORAL | 0 refills | Status: DC

## 2021-05-23 NOTE — Progress Notes (Signed)
Pt called requesting refill ofNaltrexone. Taking extra. Review of Up to Date reports 100 mg daily used to suppress alcohol cravings if pt feels need for higher dose. Rx for 90 days no refill sent.

## 2021-05-24 ENCOUNTER — Encounter (HOSPITAL_COMMUNITY): Payer: Medicare Other

## 2021-05-28 ENCOUNTER — Encounter (HOSPITAL_COMMUNITY): Payer: Medicare Other

## 2021-05-29 ENCOUNTER — Other Ambulatory Visit: Payer: Self-pay | Admitting: Internal Medicine

## 2021-05-29 DIAGNOSIS — Z1231 Encounter for screening mammogram for malignant neoplasm of breast: Secondary | ICD-10-CM

## 2021-05-30 ENCOUNTER — Encounter (HOSPITAL_COMMUNITY): Payer: Medicare Other

## 2021-05-31 ENCOUNTER — Ambulatory Visit: Payer: Medicare Other

## 2021-06-04 ENCOUNTER — Encounter (HOSPITAL_COMMUNITY): Payer: Medicare Other

## 2021-06-06 ENCOUNTER — Encounter (HOSPITAL_COMMUNITY): Payer: Medicare Other

## 2021-06-07 ENCOUNTER — Encounter (HOSPITAL_COMMUNITY): Payer: Medicare Other

## 2021-06-11 ENCOUNTER — Other Ambulatory Visit (HOSPITAL_COMMUNITY): Payer: Self-pay | Admitting: Medical

## 2021-06-14 NOTE — Telephone Encounter (Signed)
CD IOP pt ?

## 2021-06-28 ENCOUNTER — Ambulatory Visit: Payer: Medicare Other

## 2021-06-28 ENCOUNTER — Other Ambulatory Visit: Payer: Self-pay | Admitting: Internal Medicine

## 2021-06-28 DIAGNOSIS — Z122 Encounter for screening for malignant neoplasm of respiratory organs: Secondary | ICD-10-CM

## 2021-06-29 ENCOUNTER — Ambulatory Visit
Admission: RE | Admit: 2021-06-29 | Discharge: 2021-06-29 | Disposition: A | Discharge status: Home / Self Care | Payer: Medicare Other | Source: Ambulatory Visit | Attending: Internal Medicine | Admitting: Internal Medicine

## 2021-06-29 DIAGNOSIS — Z1231 Encounter for screening mammogram for malignant neoplasm of breast: Secondary | ICD-10-CM

## 2021-07-18 ENCOUNTER — Ambulatory Visit
Admission: RE | Admit: 2021-07-18 | Discharge: 2021-07-18 | Disposition: A | Discharge status: Home / Self Care | Payer: Medicare Other | Source: Ambulatory Visit | Attending: Internal Medicine | Admitting: Internal Medicine

## 2021-07-18 DIAGNOSIS — Z122 Encounter for screening for malignant neoplasm of respiratory organs: Secondary | ICD-10-CM

## 2021-07-23 ENCOUNTER — Other Ambulatory Visit (HOSPITAL_COMMUNITY): Payer: Self-pay | Admitting: Medical

## 2021-07-26 NOTE — Telephone Encounter (Signed)
Pt no longerunder care here 

## 2021-08-02 ENCOUNTER — Ambulatory Visit: Payer: Medicare Other | Admitting: Podiatry

## 2021-08-17 ENCOUNTER — Ambulatory Visit (INDEPENDENT_AMBULATORY_CARE_PROVIDER_SITE_OTHER): Payer: Medicare Other | Admitting: Podiatry

## 2021-08-17 ENCOUNTER — Encounter: Payer: Self-pay | Admitting: Podiatry

## 2021-08-17 ENCOUNTER — Ambulatory Visit (INDEPENDENT_AMBULATORY_CARE_PROVIDER_SITE_OTHER): Payer: Medicare Other

## 2021-08-17 DIAGNOSIS — M76822 Posterior tibial tendinitis, left leg: Secondary | ICD-10-CM

## 2021-08-17 DIAGNOSIS — M722 Plantar fascial fibromatosis: Secondary | ICD-10-CM

## 2021-08-17 MED ORDER — TRIAMCINOLONE ACETONIDE 10 MG/ML IJ SUSP
10.0000 mg | Freq: Once | INTRAMUSCULAR | Status: AC
  Administered 2021-08-17: 10 mg

## 2021-08-20 NOTE — Progress Notes (Signed)
Subjective:  ? ?Patient ID: Tiffany Mueller, female   DOB: 66 y.o.   MRN: [id number]  ? ?HPI ?Patient continues to experience a lot of discomfort in the plantar heel left and also extending out into the inside of the left ankle where she is walking differently.  Pain is still worse when she gets up in the morning and after periods of sitting ? ? ?ROS ? ? ?   ?Objective:  ?Physical Exam  ?Acute discomfort plantar aspect left heel insertional point and into the arch and also discomfort posterior tibial insertion left with fluid buildup around the insertional point but no indications currently of muscle dysfunction or indications of abnormal from this perspective ? ?   ?Assessment:  ?Acute Planter fasciitis compensatory posterior tibial tendinitis left ? ?   ?Plan:  ?H&P reviewed condition and I did go ahead today and I did sterile prep and I reinjected the fascia and slightly in the posterior tib and into the arch 3 mg dexamethasone Kenalog 5 mg Xylocaine I instructed on supportive shoe gear usage and I dispensed a night splint that I want her to start wearing at night in order to stretch the arch properly.  We gave all instructions on its usage and I did dispense ice packs that I want her to use twice to 3 times a day.  Reappoint to recheck as needed ?   ? ? ?

## 2021-09-15 ENCOUNTER — Other Ambulatory Visit (HOSPITAL_COMMUNITY): Payer: Self-pay | Admitting: Medical

## 2021-09-20 NOTE — Telephone Encounter (Signed)
Pt no longer under care May make appt if desired to [facility] care

## 2021-09-27 ENCOUNTER — Telehealth (HOSPITAL_COMMUNITY): Payer: Self-pay | Admitting: *Deleted

## 2021-09-27 ENCOUNTER — Telehealth (HOSPITAL_COMMUNITY): Payer: Medicare Other | Admitting: Medical

## 2021-09-27 NOTE — Telephone Encounter (Signed)
Writer called pt to advise that she will need an appointment with provider prior to receiving refills. Pt now has an appointment for today, 09/27/21 @ 1600. FYI.

## 2021-10-11 ENCOUNTER — Encounter (HOSPITAL_COMMUNITY): Payer: Self-pay | Admitting: Medical

## 2021-10-11 ENCOUNTER — Telehealth (HOSPITAL_BASED_OUTPATIENT_CLINIC_OR_DEPARTMENT_OTHER): Payer: Medicare Other | Admitting: Medical

## 2021-10-11 DIAGNOSIS — F331 Major depressive disorder, recurrent, moderate: Secondary | ICD-10-CM

## 2021-10-11 DIAGNOSIS — F4312 Post-traumatic stress disorder, chronic: Secondary | ICD-10-CM

## 2021-10-11 DIAGNOSIS — Z8719 Personal history of other diseases of the digestive system: Secondary | ICD-10-CM

## 2021-10-11 DIAGNOSIS — F1021 Alcohol dependence, in remission: Secondary | ICD-10-CM | POA: Diagnosis not present

## 2021-10-11 DIAGNOSIS — I10 Essential (primary) hypertension: Secondary | ICD-10-CM

## 2021-10-11 DIAGNOSIS — F10982 Alcohol use, unspecified with alcohol-induced sleep disorder: Secondary | ICD-10-CM

## 2021-10-11 DIAGNOSIS — F5081 Binge eating disorder: Secondary | ICD-10-CM

## 2021-10-11 DIAGNOSIS — Z8739 Personal history of other diseases of the musculoskeletal system and connective tissue: Secondary | ICD-10-CM

## 2021-10-11 DIAGNOSIS — Z9884 Bariatric surgery status: Secondary | ICD-10-CM

## 2021-10-11 DIAGNOSIS — F1994 Other psychoactive substance use, unspecified with psychoactive substance-induced mood disorder: Secondary | ICD-10-CM

## 2021-10-11 DIAGNOSIS — Z90710 Acquired absence of both cervix and uterus: Secondary | ICD-10-CM

## 2021-10-11 DIAGNOSIS — T7492XS Unspecified child maltreatment, confirmed, sequela: Secondary | ICD-10-CM

## 2021-10-11 DIAGNOSIS — Z8659 Personal history of other mental and behavioral disorders: Secondary | ICD-10-CM

## 2021-10-11 DIAGNOSIS — F1321 Sedative, hypnotic or anxiolytic dependence, in remission: Secondary | ICD-10-CM

## 2021-10-11 DIAGNOSIS — F17210 Nicotine dependence, cigarettes, uncomplicated: Secondary | ICD-10-CM

## 2021-10-11 MED ORDER — NALTREXONE HCL 50 MG PO TABS: 50.0000 mg | ORAL_TABLET | Freq: Every day | ORAL | 1 refills | Status: AC

## 2021-10-11 MED ORDER — QUETIAPINE FUMARATE 25 MG PO TABS: 25.0000 mg | ORAL_TABLET | Freq: Every day | ORAL | 1 refills | Status: DC

## 2021-10-11 MED ORDER — QUETIAPINE FUMARATE 50 MG PO TABS: 50.0000 mg | ORAL_TABLET | Freq: Every day | ORAL | 1 refills | Status: DC
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
## 2021-10-24 ENCOUNTER — Ambulatory Visit
Admission: RE | Admit: 2021-10-24 | Discharge: 2021-10-24 | Disposition: A | Discharge status: Home / Self Care | Payer: Medicare Other | Source: Ambulatory Visit | Attending: Internal Medicine | Admitting: Internal Medicine

## 2021-10-24 DIAGNOSIS — M810 Age-related osteoporosis without current pathological fracture: Secondary | ICD-10-CM

## 2022-04-03 ENCOUNTER — Other Ambulatory Visit (HOSPITAL_COMMUNITY): Payer: Self-pay | Admitting: Medical

## 2022-04-04 NOTE — Telephone Encounter (Signed)
Needs appt

## 2022-04-17 ENCOUNTER — Telehealth (HOSPITAL_BASED_OUTPATIENT_CLINIC_OR_DEPARTMENT_OTHER): Payer: Medicare Other | Admitting: Medical

## 2022-04-17 DIAGNOSIS — F1321 Sedative, hypnotic or anxiolytic dependence, in remission: Secondary | ICD-10-CM | POA: Diagnosis not present

## 2022-04-17 DIAGNOSIS — T7492XS Unspecified child maltreatment, confirmed, sequela: Secondary | ICD-10-CM

## 2022-04-17 DIAGNOSIS — Z8659 Personal history of other mental and behavioral disorders: Secondary | ICD-10-CM

## 2022-04-17 DIAGNOSIS — I10 Essential (primary) hypertension: Secondary | ICD-10-CM

## 2022-04-17 DIAGNOSIS — F1994 Other psychoactive substance use, unspecified with psychoactive substance-induced mood disorder: Secondary | ICD-10-CM | POA: Diagnosis not present

## 2022-04-17 DIAGNOSIS — F5081 Binge eating disorder: Secondary | ICD-10-CM | POA: Diagnosis not present

## 2022-04-17 DIAGNOSIS — F4312 Post-traumatic stress disorder, chronic: Secondary | ICD-10-CM | POA: Diagnosis not present

## 2022-04-17 DIAGNOSIS — F17210 Nicotine dependence, cigarettes, uncomplicated: Secondary | ICD-10-CM

## 2022-04-17 DIAGNOSIS — F331 Major depressive disorder, recurrent, moderate: Secondary | ICD-10-CM

## 2022-04-17 DIAGNOSIS — Z8719 Personal history of other diseases of the digestive system: Secondary | ICD-10-CM

## 2022-04-17 DIAGNOSIS — F10982 Alcohol use, unspecified with alcohol-induced sleep disorder: Secondary | ICD-10-CM

## 2022-04-17 DIAGNOSIS — Z90710 Acquired absence of both cervix and uterus: Secondary | ICD-10-CM

## 2022-04-17 DIAGNOSIS — F1021 Alcohol dependence, in remission: Secondary | ICD-10-CM

## 2022-04-17 DIAGNOSIS — Z8739 Personal history of other diseases of the musculoskeletal system and connective tissue: Secondary | ICD-10-CM

## 2022-04-17 DIAGNOSIS — Z9884 Bariatric surgery status: Secondary | ICD-10-CM

## 2022-04-18 ENCOUNTER — Telehealth (HOSPITAL_COMMUNITY): Payer: Medicare Other | Admitting: Medical

## 2022-04-20 ENCOUNTER — Other Ambulatory Visit (HOSPITAL_COMMUNITY): Payer: Self-pay | Admitting: Medical

## 2022-04-22 ENCOUNTER — Other Ambulatory Visit (HOSPITAL_COMMUNITY): Payer: Self-pay | Admitting: Medical

## 2022-04-22 ENCOUNTER — Encounter (HOSPITAL_COMMUNITY): Payer: Self-pay | Admitting: Medical

## 2022-04-22 ENCOUNTER — Telehealth (HOSPITAL_COMMUNITY): Payer: Self-pay | Admitting: *Deleted

## 2022-04-22 MED ORDER — QUETIAPINE FUMARATE 50 MG PO TABS: 50.0000 mg | ORAL_TABLET | Freq: Every day | ORAL | 1 refills | Status: DC

## 2022-04-22 MED ORDER — VENLAFAXINE HCL ER 37.5 MG PO CP24: 37.5000 mg | ORAL_CAPSULE | Freq: Two times a day (BID) | ORAL | 1 refills | Status: DC

## 2022-04-22 MED ORDER — NALTREXONE HCL 50 MG PO TABS
50.0000 mg | ORAL_TABLET | Freq: Every day | ORAL | 1 refills | Status: DC
Start: 2022-04-22 — End: 2022-06-20

## 2022-04-22 MED ORDER — QUETIAPINE FUMARATE 25 MG PO TABS: 25.0000 mg | ORAL_TABLET | Freq: Every day | ORAL | 1 refills | Status: DC

## 2022-04-22 NOTE — Telephone Encounter (Signed)
Pt called requesting refills of both Seroquel doses and also the Naltrxone 50 mg as scripts have expired and pt is going out of town on the 19th. Pt next scheduled appointment is for 10/17/22. Please review.
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
## 2022-04-25 NOTE — Telephone Encounter (Signed)
Unable to find original visit rx in EPIC so reordered Pt obviously does not need 2 prescriptions

## 2022-06-13 ENCOUNTER — Other Ambulatory Visit (HOSPITAL_COMMUNITY): Payer: Self-pay | Admitting: Medical

## 2022-06-20 NOTE — Telephone Encounter (Signed)
Pt just seen

## 2022-08-29 ENCOUNTER — Other Ambulatory Visit: Payer: Self-pay | Admitting: Internal Medicine

## 2022-08-29 DIAGNOSIS — Z122 Encounter for screening for malignant neoplasm of respiratory organs: Secondary | ICD-10-CM

## 2022-08-30 ENCOUNTER — Other Ambulatory Visit: Payer: Self-pay | Admitting: Internal Medicine

## 2022-08-30 DIAGNOSIS — Z1231 Encounter for screening mammogram for malignant neoplasm of breast: Secondary | ICD-10-CM

## 2022-09-12 ENCOUNTER — Other Ambulatory Visit (HOSPITAL_COMMUNITY): Payer: Self-pay | Admitting: Medical

## 2022-09-12 NOTE — Telephone Encounter (Signed)
Pt picked up 90 day refill 07/15/2022

## 2022-09-18 ENCOUNTER — Ambulatory Visit
Admission: RE | Admit: 2022-09-18 | Discharge: 2022-09-18 | Disposition: A | Discharge status: Home / Self Care | Payer: Medicare Other | Source: Ambulatory Visit | Attending: Internal Medicine | Admitting: Internal Medicine

## 2022-09-18 DIAGNOSIS — Z122 Encounter for screening for malignant neoplasm of respiratory organs: Secondary | ICD-10-CM

## 2022-10-01 ENCOUNTER — Other Ambulatory Visit (HOSPITAL_COMMUNITY): Payer: Self-pay | Admitting: Medical

## 2022-10-03 NOTE — Telephone Encounter (Signed)
Pt has FU for RF 7/11

## 2022-10-04 ENCOUNTER — Other Ambulatory Visit (HOSPITAL_COMMUNITY): Payer: Self-pay | Admitting: *Deleted

## 2022-10-04 MED ORDER — QUETIAPINE FUMARATE 50 MG PO TABS: ORAL_TABLET | ORAL | 0 refills | Status: DC

## 2022-10-04 MED ORDER — QUETIAPINE FUMARATE 25 MG PO TABS: 25.0000 mg | ORAL_TABLET | Freq: Every day | ORAL | 0 refills | Status: DC

## 2022-10-16 ENCOUNTER — Ambulatory Visit: Payer: Medicare Other

## 2022-10-16 DIAGNOSIS — Z1231 Encounter for screening mammogram for malignant neoplasm of breast: Secondary | ICD-10-CM

## 2022-10-17 ENCOUNTER — Telehealth (HOSPITAL_BASED_OUTPATIENT_CLINIC_OR_DEPARTMENT_OTHER): Payer: Medicare Other | Admitting: Medical

## 2022-10-17 ENCOUNTER — Encounter (HOSPITAL_COMMUNITY): Payer: Self-pay | Admitting: Medical

## 2022-10-17 DIAGNOSIS — F1021 Alcohol dependence, in remission: Secondary | ICD-10-CM

## 2022-10-17 DIAGNOSIS — Z8739 Personal history of other diseases of the musculoskeletal system and connective tissue: Secondary | ICD-10-CM

## 2022-10-17 DIAGNOSIS — Z90710 Acquired absence of both cervix and uterus: Secondary | ICD-10-CM

## 2022-10-17 DIAGNOSIS — F4312 Post-traumatic stress disorder, chronic: Secondary | ICD-10-CM

## 2022-10-17 DIAGNOSIS — Z8659 Personal history of other mental and behavioral disorders: Secondary | ICD-10-CM

## 2022-10-17 DIAGNOSIS — F1994 Other psychoactive substance use, unspecified with psychoactive substance-induced mood disorder: Secondary | ICD-10-CM

## 2022-10-17 DIAGNOSIS — Z62819 Personal history of unspecified abuse in childhood: Secondary | ICD-10-CM

## 2022-10-17 DIAGNOSIS — F1321 Sedative, hypnotic or anxiolytic dependence, in remission: Secondary | ICD-10-CM | POA: Diagnosis not present

## 2022-10-17 DIAGNOSIS — F10182 Alcohol abuse with alcohol-induced sleep disorder: Secondary | ICD-10-CM | POA: Diagnosis not present

## 2022-10-17 DIAGNOSIS — F331 Major depressive disorder, recurrent, moderate: Secondary | ICD-10-CM

## 2022-10-17 DIAGNOSIS — Z8719 Personal history of other diseases of the digestive system: Secondary | ICD-10-CM

## 2022-10-17 DIAGNOSIS — T7492XS Unspecified child maltreatment, confirmed, sequela: Secondary | ICD-10-CM

## 2022-10-17 DIAGNOSIS — F10982 Alcohol use, unspecified with alcohol-induced sleep disorder: Secondary | ICD-10-CM

## 2022-10-17 DIAGNOSIS — F17210 Nicotine dependence, cigarettes, uncomplicated: Secondary | ICD-10-CM

## 2022-10-17 DIAGNOSIS — I10 Essential (primary) hypertension: Secondary | ICD-10-CM

## 2022-10-17 DIAGNOSIS — F5081 Binge eating disorder: Secondary | ICD-10-CM

## 2022-10-17 DIAGNOSIS — Z9884 Bariatric surgery status: Secondary | ICD-10-CM

## 2022-10-17 MED ORDER — VARENICLINE TARTRATE (STARTER) 0.5 MG X 11 & 1 MG X 42 PO TBPK: ORAL_TABLET | ORAL | Status: DC

## 2022-10-17 MED ORDER — VARENICLINE TARTRATE (STARTER) 0.5 MG X 11 & 1 MG X 42 PO TBPK: ORAL_TABLET | ORAL | 0 refills | Status: DC

## 2022-10-17 MED ORDER — QUETIAPINE FUMARATE 25 MG PO TABS: 25.0000 mg | ORAL_TABLET | Freq: Every day | ORAL | 4 refills | Status: DC

## 2022-10-17 MED ORDER — QUETIAPINE FUMARATE 50 MG PO TABS: ORAL_TABLET | ORAL | 4 refills | Status: DC

## 2022-10-17 MED ORDER — NALTREXONE HCL 50 MG PO TABS: 50.0000 mg | ORAL_TABLET | Freq: Every day | ORAL | 1 refills | Status: DC

## 2022-10-17 MED ORDER — PREGABALIN 150 MG PO CAPS: 150.0000 mg | ORAL_CAPSULE | Freq: Three times a day (TID) | ORAL | 0 refills | Status: DC

## 2022-10-17 MED ORDER — BUSPIRONE HCL 15 MG PO TABS: 15.0000 mg | ORAL_TABLET | Freq: Three times a day (TID) | ORAL | 0 refills | Status: AC
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
## 2022-10-31 ENCOUNTER — Ambulatory Visit (HOSPITAL_COMMUNITY): Payer: Medicare Other | Admitting: Medical

## 2022-11-09 ENCOUNTER — Other Ambulatory Visit (HOSPITAL_COMMUNITY): Payer: Self-pay | Admitting: Medical

## 2022-11-13 ENCOUNTER — Other Ambulatory Visit (HOSPITAL_COMMUNITY): Payer: Self-pay | Admitting: Medical

## 2022-11-14 NOTE — Telephone Encounter (Signed)
Ordered at visit.

## 2022-11-14 NOTE — Telephone Encounter (Signed)
Filled at FU

## 2022-11-19 ENCOUNTER — Telehealth (HOSPITAL_COMMUNITY): Payer: Self-pay

## 2022-11-19 NOTE — Telephone Encounter (Signed)
Patient called to let you know that the Lyrica made her feel drunk and she never got up to 3x a day and she stopped taking it this past Saturday. The Wellbutrin she is not taking 3x a day as it gives her the runs, but she is taking it once a day and says she is feeling better. Please review and advise, thank you

## 2022-11-26 ENCOUNTER — Other Ambulatory Visit (HOSPITAL_COMMUNITY): Payer: Self-pay | Admitting: Medical

## 2022-11-28 NOTE — Telephone Encounter (Signed)
Pharmacy request for 90 day rx

## 2022-12-11 ENCOUNTER — Other Ambulatory Visit (HOSPITAL_COMMUNITY): Payer: Self-pay | Admitting: Medical

## 2023-01-16 ENCOUNTER — Ambulatory Visit (INDEPENDENT_AMBULATORY_CARE_PROVIDER_SITE_OTHER): Payer: Medicare Other | Admitting: Podiatry

## 2023-01-16 ENCOUNTER — Ambulatory Visit: Payer: Medicare Other

## 2023-01-16 ENCOUNTER — Encounter: Payer: Self-pay | Admitting: Podiatry

## 2023-01-16 ENCOUNTER — Ambulatory Visit: Payer: BLUE CROSS/BLUE SHIELD

## 2023-01-16 DIAGNOSIS — M722 Plantar fascial fibromatosis: Secondary | ICD-10-CM

## 2023-01-16 MED ORDER — TRIAMCINOLONE ACETONIDE 10 MG/ML IJ SUSP
10.0000 mg | Freq: Once | INTRAMUSCULAR | Status: AC
Start: 2023-01-16 — End: 2023-01-16
  Administered 2023-01-16: 10 mg via INTRA_ARTICULAR

## 2023-01-17 NOTE — Progress Notes (Signed)
Subjective:   Patient ID: Tiffany Mueller, female   DOB: 67 y.o.   MRN: [id number]   HPI Patient presents with very painful heel pain both feet plantar heel at the insertion of the tendon into the calcaneus with fluid buildup noted   ROS      Objective:  Physical Exam  Acute flareup plantar fasciitis bilateral that she stated she had approximate 4 to 6 months of relief at last visit     Assessment:  Chronic Planter fasciitis bilateral with pain at the insertional point     Plan:  H&P reviewed condition and I want to see how much relief we truly get with sterile prep done today and injected the plantar fascia at insertion 3 mg Kenalog 5 mg Xylocaine and instructed on supportive shoes at this time.  Reappoint as symptoms indicate  X-rays indicate spur formation moderate in intensity bilateral moderate depression of the arch no indication stress fracture arthritis bilateral

## 2023-01-20 ENCOUNTER — Encounter: Payer: Self-pay | Admitting: Podiatry

## 2023-07-14 IMAGING — MG MM DIGITAL SCREENING BILAT W/ TOMO AND CAD
8 series · 8 of 24 positions shown · non-contrast
Comparison: Previous exam(s).

CLINICAL DATA: Screening.

EXAM:
DIGITAL SCREENING BILATERAL MAMMOGRAM WITH TOMOSYNTHESIS AND CAD
TECHNIQUE: Bilateral screening digital craniocaudal and mediolateral oblique
mammograms were obtained. Bilateral screening digital breast
tomosynthesis was performed. The images were evaluated with
computer-aided detection.

[R MLO synth-2D]
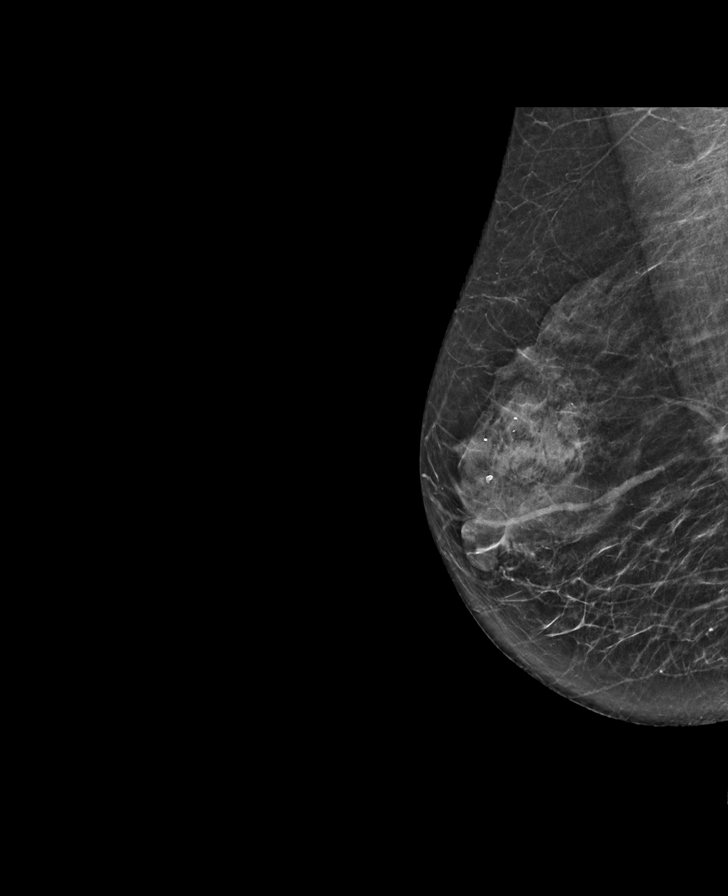

[L MLO synth-2D]
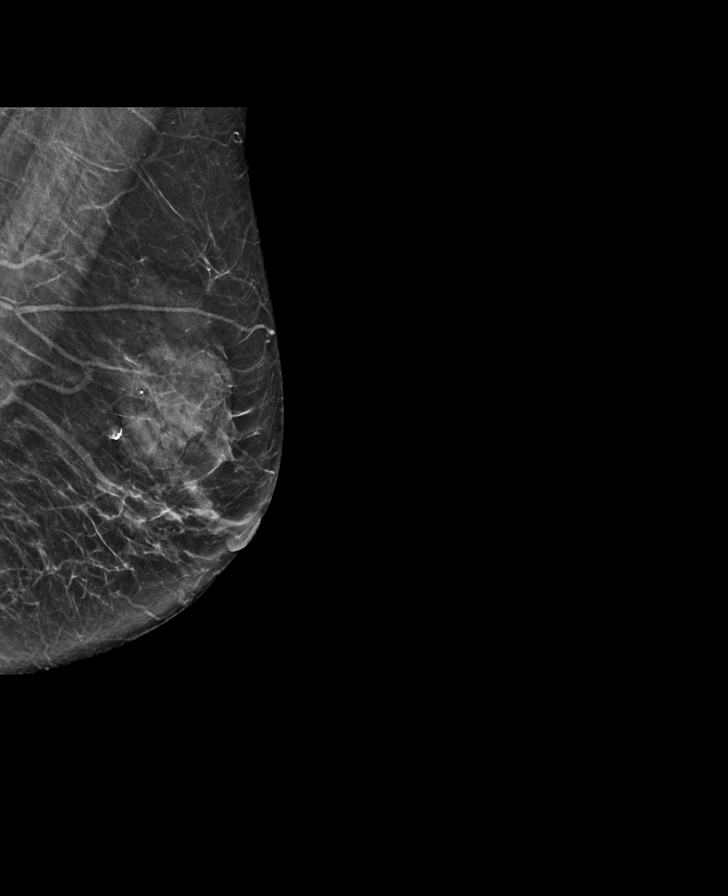

[L CC synth-2D]
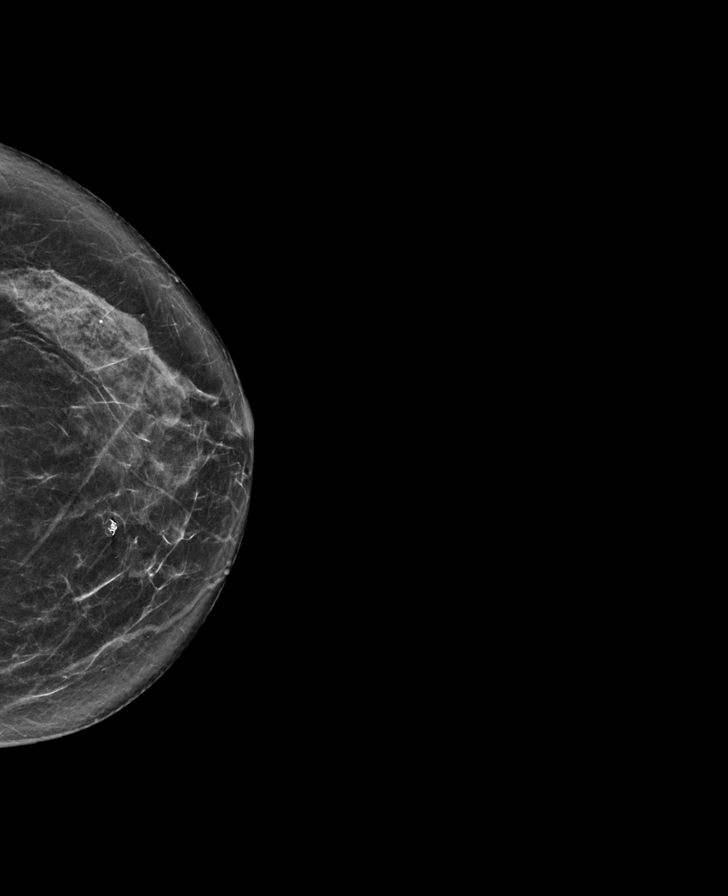

[R CC synth-2D]
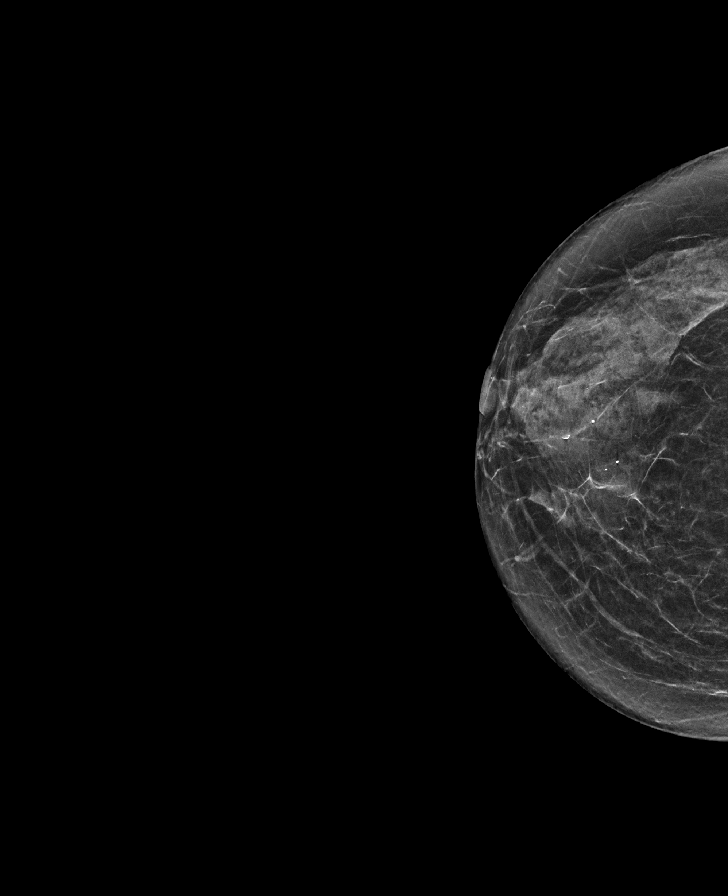

[L MLO tomo · tomo slice 31/61.0]
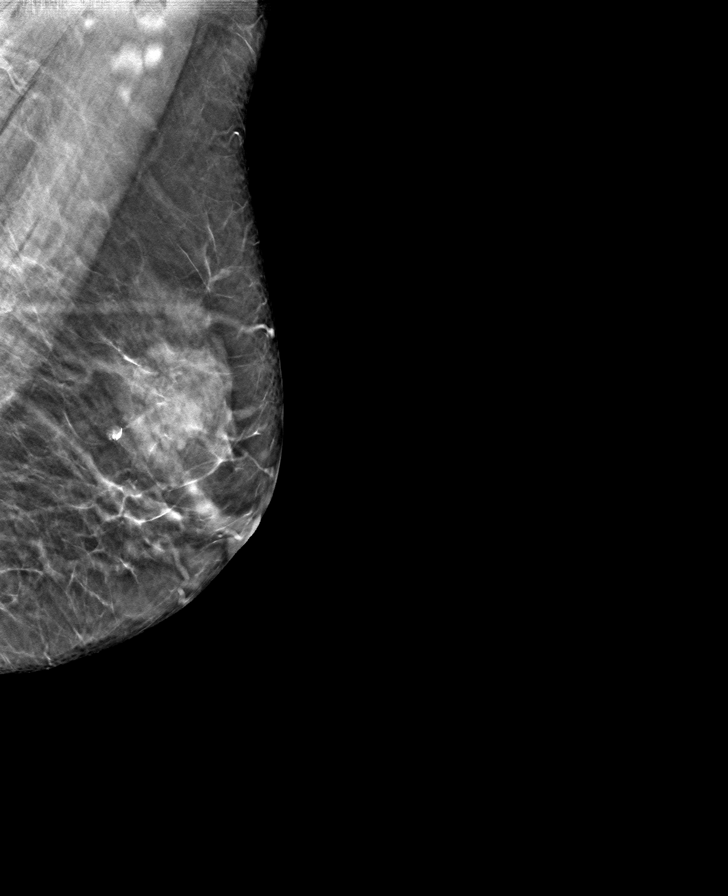

[R MLO tomo · tomo slice 29/56.0]
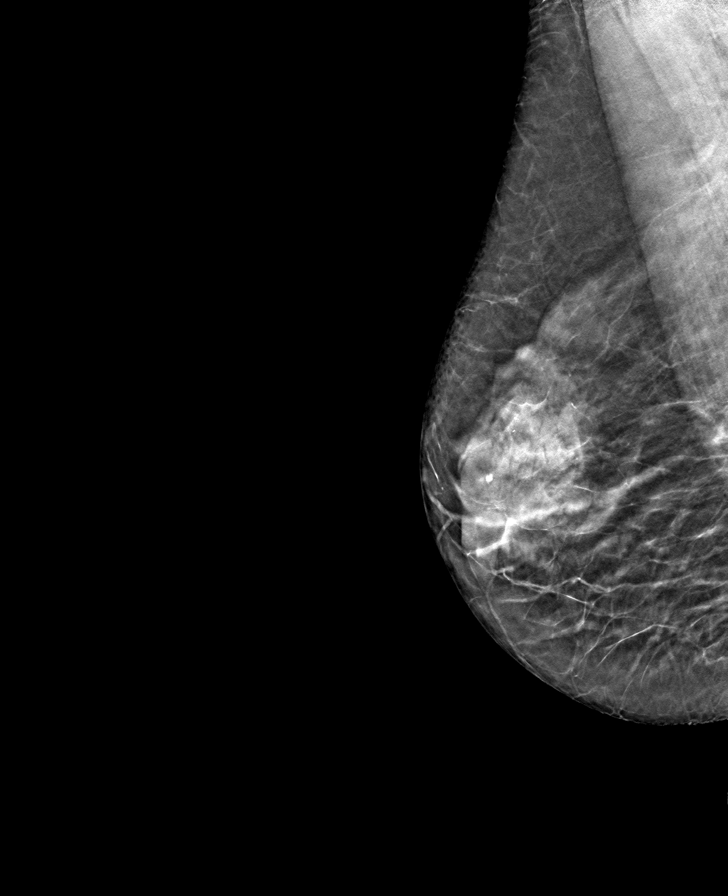

[R CC tomo · tomo slice 29/56.0]
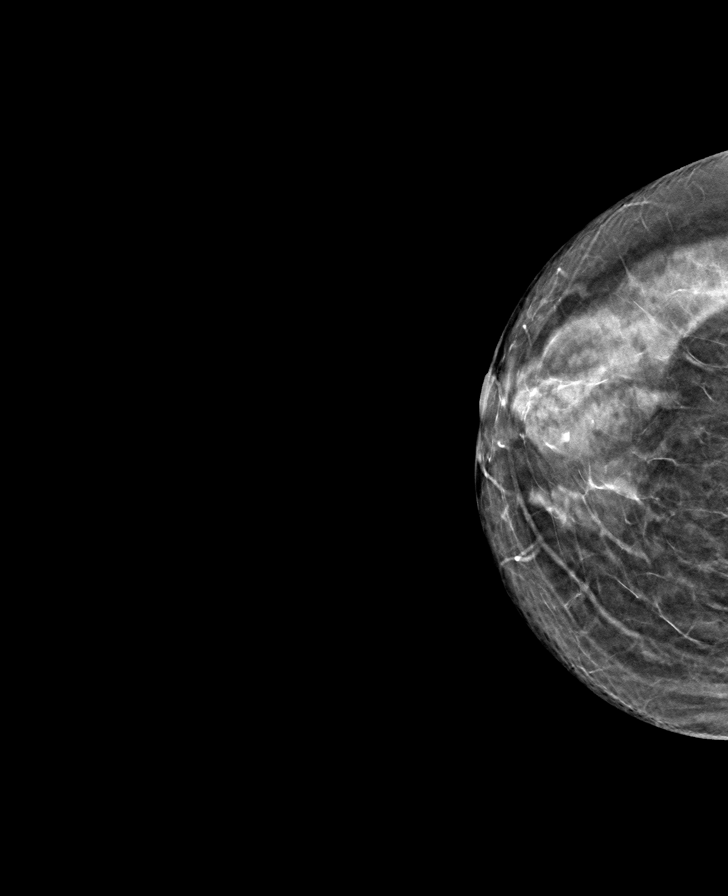

[L CC tomo · tomo slice 33/64.0]
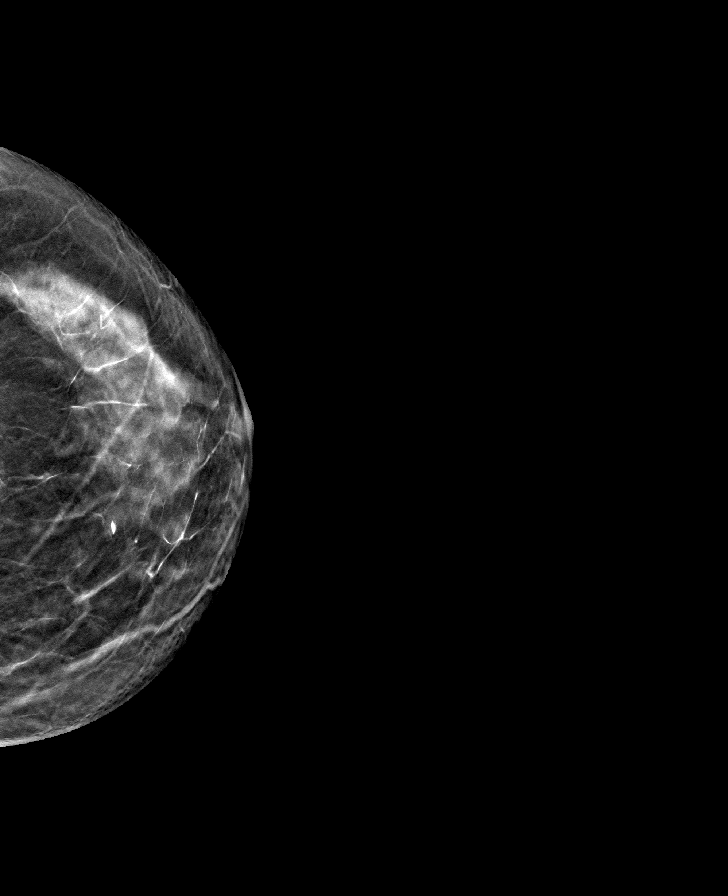

[8 of 24 positions shown; findings below may reference images not displayed]

ACR Breast Density Category c: The breast tissue is heterogeneously
dense, which may obscure small masses.
FINDINGS: There are no findings suspicious for malignancy.
IMPRESSION: No mammographic evidence of malignancy. A result letter of this
screening mammogram will be mailed directly to the patient.

RECOMMENDATION:
Screening mammogram in one year. (Code:Q3-[id number])

BI-RADS CATEGORY  1: Negative.

## 2023-08-27 ENCOUNTER — Other Ambulatory Visit (HOSPITAL_COMMUNITY): Payer: Self-pay | Admitting: Medical

## 2023-09-03 NOTE — Telephone Encounter (Signed)
 Pt needs an appointment

## 2023-09-04 ENCOUNTER — Other Ambulatory Visit (HOSPITAL_COMMUNITY): Payer: Self-pay | Admitting: *Deleted

## 2023-09-04 MED ORDER — QUETIAPINE FUMARATE 25 MG PO TABS: 25.0000 mg | ORAL_TABLET | Freq: Every day | ORAL | 0 refills | Status: DC

## 2023-09-04 MED ORDER — QUETIAPINE FUMARATE 50 MG PO TABS: ORAL_TABLET | ORAL | 0 refills | Status: DC

## 2023-09-08 ENCOUNTER — Telehealth (HOSPITAL_BASED_OUTPATIENT_CLINIC_OR_DEPARTMENT_OTHER): Admitting: Medical

## 2023-09-08 ENCOUNTER — Encounter (HOSPITAL_COMMUNITY): Payer: Self-pay | Admitting: Medical

## 2023-09-08 DIAGNOSIS — Z62819 Personal history of unspecified abuse in childhood: Secondary | ICD-10-CM

## 2023-09-08 DIAGNOSIS — Z8659 Personal history of other mental and behavioral disorders: Secondary | ICD-10-CM

## 2023-09-08 DIAGNOSIS — F331 Major depressive disorder, recurrent, moderate: Secondary | ICD-10-CM

## 2023-09-08 DIAGNOSIS — F1021 Alcohol dependence, in remission: Secondary | ICD-10-CM

## 2023-09-08 DIAGNOSIS — F4312 Post-traumatic stress disorder, chronic: Secondary | ICD-10-CM

## 2023-09-08 DIAGNOSIS — F1321 Sedative, hypnotic or anxiolytic dependence, in remission: Secondary | ICD-10-CM | POA: Diagnosis not present

## 2023-09-08 DIAGNOSIS — F10982 Alcohol use, unspecified with alcohol-induced sleep disorder: Secondary | ICD-10-CM

## 2023-09-08 DIAGNOSIS — F10282 Alcohol dependence with alcohol-induced sleep disorder: Secondary | ICD-10-CM | POA: Diagnosis not present

## 2023-09-08 DIAGNOSIS — F50811 Binge eating disorder, moderate: Secondary | ICD-10-CM

## 2023-09-08 DIAGNOSIS — F1994 Other psychoactive substance use, unspecified with psychoactive substance-induced mood disorder: Secondary | ICD-10-CM | POA: Diagnosis not present

## 2023-09-08 DIAGNOSIS — T7492XS Unspecified child maltreatment, confirmed, sequela: Secondary | ICD-10-CM

## 2023-09-08 DIAGNOSIS — Z90710 Acquired absence of both cervix and uterus: Secondary | ICD-10-CM

## 2023-09-08 MED ORDER — VENLAFAXINE HCL ER 37.5 MG PO CP24: 37.5000 mg | ORAL_CAPSULE | Freq: Every day | ORAL | 1 refills | Status: DC

## 2023-09-08 MED ORDER — BACLOFEN 10 MG PO TABS: 10.0000 mg | ORAL_TABLET | Freq: Three times a day (TID) | ORAL | 1 refills | Status: AC

## 2023-09-08 MED ORDER — QUETIAPINE FUMARATE 25 MG PO TABS: 25.0000 mg | ORAL_TABLET | Freq: Every day | ORAL | 1 refills | Status: DC

## 2023-09-08 MED ORDER — QUETIAPINE FUMARATE 50 MG PO TABS: ORAL_TABLET | ORAL | 1 refills | Status: DC
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
## 2023-09-19 ENCOUNTER — Other Ambulatory Visit (HOSPITAL_COMMUNITY): Payer: Self-pay | Admitting: Medical

## 2023-10-01 ENCOUNTER — Other Ambulatory Visit: Payer: Self-pay | Admitting: Internal Medicine

## 2023-10-01 DIAGNOSIS — Z122 Encounter for screening for malignant neoplasm of respiratory organs: Secondary | ICD-10-CM

## 2023-10-02 ENCOUNTER — Telehealth (HOSPITAL_COMMUNITY): Admitting: Medical

## 2023-10-02 ENCOUNTER — Ambulatory Visit (INDEPENDENT_AMBULATORY_CARE_PROVIDER_SITE_OTHER): Admitting: Podiatry

## 2023-10-02 ENCOUNTER — Encounter: Payer: Self-pay | Admitting: Podiatry

## 2023-10-02 DIAGNOSIS — M722 Plantar fascial fibromatosis: Secondary | ICD-10-CM | POA: Diagnosis not present

## 2023-10-02 MED ORDER — TRIAMCINOLONE ACETONIDE 10 MG/ML IJ SUSP
10.0000 mg | Freq: Once | INTRAMUSCULAR | Status: AC
  Administered 2023-10-02: 10 mg via INTRA_ARTICULAR

## 2023-10-02 NOTE — Progress Notes (Signed)
 Subjective:   Patient ID: Tiffany Mueller, female   DOB: 68 y.o.   MRN: [id number]   HPI Patient states she has developed a lot of pain in my heels bilateral and states they did well for about 6 months   ROS      Objective:  Physical Exam  Neurovascular status intact acute discomfort in the plantar fascia at insertion bilateral with fluid buildup     Assessment:  Acute plantar fasciitis bilateral     Plan:  H&P reviewed and went ahead today sterile prep injected the plantar fascia at insertion 3 mg Kenalog  5 g of Applied sterile dressing instructed on continued support reappoint as needed

## 2023-10-06 ENCOUNTER — Encounter: Payer: Self-pay | Admitting: Podiatry

## 2023-10-06 NOTE — Telephone Encounter (Signed)
I can see her at 11:15 or 11:30.

## 2023-10-08 ENCOUNTER — Telehealth (HOSPITAL_COMMUNITY): Admitting: Medical

## 2023-10-21 ENCOUNTER — Inpatient Hospital Stay
Admission: RE | Admit: 2023-10-21 | Discharge: 2023-10-21 | Disposition: A | Discharge status: Home / Self Care | Source: Ambulatory Visit | Attending: Internal Medicine

## 2023-10-21 ENCOUNTER — Encounter (INDEPENDENT_AMBULATORY_CARE_PROVIDER_SITE_OTHER): Payer: Self-pay

## 2023-10-21 DIAGNOSIS — Z122 Encounter for screening for malignant neoplasm of respiratory organs: Secondary | ICD-10-CM

## 2023-12-12 ENCOUNTER — Other Ambulatory Visit (HOSPITAL_COMMUNITY): Payer: Self-pay | Admitting: Medical

## 2023-12-12 NOTE — Telephone Encounter (Signed)
 Pt needs appointment for further refills

## 2023-12-15 ENCOUNTER — Encounter (INDEPENDENT_AMBULATORY_CARE_PROVIDER_SITE_OTHER): Payer: Self-pay

## 2023-12-22 ENCOUNTER — Other Ambulatory Visit (HOSPITAL_COMMUNITY): Payer: Self-pay | Admitting: Sports Medicine

## 2023-12-22 DIAGNOSIS — M25561 Pain in right knee: Secondary | ICD-10-CM

## 2023-12-24 ENCOUNTER — Ambulatory Visit (HOSPITAL_COMMUNITY)
Admission: RE | Admit: 2023-12-24 | Discharge: 2023-12-24 | Disposition: A | Discharge status: Home / Self Care | Source: Ambulatory Visit | Attending: Sports Medicine | Admitting: Sports Medicine

## 2023-12-24 DIAGNOSIS — M25561 Pain in right knee: Secondary | ICD-10-CM | POA: Diagnosis present

## 2024-03-07 ENCOUNTER — Other Ambulatory Visit (HOSPITAL_COMMUNITY): Payer: Self-pay | Admitting: Medical

## 2024-03-11 ENCOUNTER — Encounter (HOSPITAL_COMMUNITY): Payer: Self-pay | Admitting: Medical

## 2024-03-11 ENCOUNTER — Other Ambulatory Visit: Payer: Self-pay

## 2024-03-11 ENCOUNTER — Ambulatory Visit (HOSPITAL_COMMUNITY): Admitting: Medical

## 2024-03-11 VITALS — BP 160/67 | HR 94 | Ht 62.0 in | Wt 151.0 lb

## 2024-03-11 DIAGNOSIS — F331 Major depressive disorder, recurrent, moderate: Secondary | ICD-10-CM

## 2024-03-11 DIAGNOSIS — F1321 Sedative, hypnotic or anxiolytic dependence, in remission: Secondary | ICD-10-CM | POA: Diagnosis not present

## 2024-03-11 DIAGNOSIS — F10982 Alcohol use, unspecified with alcohol-induced sleep disorder: Secondary | ICD-10-CM

## 2024-03-11 DIAGNOSIS — F4312 Post-traumatic stress disorder, chronic: Secondary | ICD-10-CM | POA: Diagnosis not present

## 2024-03-11 DIAGNOSIS — F1994 Other psychoactive substance use, unspecified with psychoactive substance-induced mood disorder: Secondary | ICD-10-CM

## 2024-03-11 DIAGNOSIS — Z90710 Acquired absence of both cervix and uterus: Secondary | ICD-10-CM

## 2024-03-11 DIAGNOSIS — F50811 Binge eating disorder, moderate: Secondary | ICD-10-CM

## 2024-03-11 DIAGNOSIS — F1021 Alcohol dependence, in remission: Secondary | ICD-10-CM

## 2024-03-11 DIAGNOSIS — F50814 Binge eating disorder, in remission: Secondary | ICD-10-CM

## 2024-03-11 DIAGNOSIS — Z8659 Personal history of other mental and behavioral disorders: Secondary | ICD-10-CM

## 2024-03-11 DIAGNOSIS — T7492XS Unspecified child maltreatment, confirmed, sequela: Secondary | ICD-10-CM

## 2024-03-11 MED ORDER — QUETIAPINE FUMARATE 100 MG PO TABS: ORAL_TABLET | ORAL | 0 refills | Status: DC

## 2024-03-11 MED ORDER — NALTREXONE HCL 50 MG PO TABS: 50.0000 mg | ORAL_TABLET | Freq: Every day | ORAL | 1 refills | Status: AC

## 2024-03-11 MED ORDER — VENLAFAXINE HCL ER 75 MG PO CP24: 75.0000 mg | ORAL_CAPSULE | Freq: Every day | ORAL | 1 refills | Status: AC

## 2024-03-11 NOTE — Progress Notes (Signed)
 [facility] MD/PA/NP OP Progress Note  03/11/2024 5:20 PM DEONDRIA PURYEAR  MRN:  [id number]  Chief Complaint: No chief complaint on file.  HPI: Tiffany Mueller is seen for FU and Medication refill/management. She was last seen Virt[facility] 09/08/2023 having been lost to care after 10/17/2022 visit. She failed to FU in 6 months as prescribed and requested refills which were denied without visit .  Plan:  Refilled relevant medications Discussed the true nature of chronic alciholism -insidious idea one can return to use and control and the results being worse loss of control with disillusionment and incomprhensible demoralization And encouraged her to return to treatment Needs to stop amphetamine  while drinking and may then? FU 4 weeks She again failed to follow up  Today she reports she has been alcohol  free since her last visit. She is planning to travel with her grandson next month but c/o daughter in law conditions for travel. (Son and daughter in law are divorcing) She wants to make some adjustments to her meds-increasing Effexor  due to lack of efficacy at low dose and her Seroquel  HS.    Visit Diagnosis: No diagnosis found.  Past Psychiatric History:  Alcohol  use disorder, major depressive disorder, recurrent, ADD, Eating disorder  PTSD 1 psychiatric hospitalization for eating disorder in the 1990s Past Medical History:   - [facility]  Assessment Encounter Date Assessment Date Assessment LastModified by Organization Details LastModified Time  01/01/2024 01/01/2024 Corisa and her friend and I discussed and reviewed her exam and her MRI in great detail today. Unfortunately, she has a difficult problem. She has done really well up until just the summer. However now is dealing with a radial tear medial meniscus with just left the meniscus completely extruded and dysfunctional in the medial compartment. This is also associated with subchondral stress fractures of the medial tibial plateau and medial femoral  condyle with associated areas of full-thickness cartilage loss. This is not a knee that would do well with arthroscopy. I think her options would be limited to injections with outpatient therapies as well as arthroplasty. She may be candidate for a unicompartmental, but does also have some patellofemoral disease. My recommendation for today was for injection and follow-up with one of my arthroplasty colleagues. [email or web address] Not available 01/05/2024 22:16:22   Past Medical History:  Diagnosis Date   ADD (attention deficit disorder)    Anastomotic stricture of gastrojejunostomy 2002   EGD dilation   Anxiety    Arthritis    Carpal tunnel syndrome    Depression    Hypertension    Osteoporosis     Past Surgical History:  Procedure Laterality Date   ABDOMINAL HYSTERECTOMY  2007   APPENDECTOMY     CARPAL TUNNEL RELEASE Left 07/04/2015   CARPAL TUNNEL RELEASE Left 07/04/2015   Procedure: LEFT CARPAL TUNNEL RELEASE;  Surgeon: Elsie Mussel, MD;  Location: [facility] OR;  Service: Orthopedics;  Laterality: Left;   ESOPHAGOGASTRODUODENOSCOPY (EGD) WITH ESOPHAGEAL DILATION  2002   Dr Celestia.  GJ stricture   FRACTURE SURGERY     GASTRIC BYPASS  2001   [facility]   LAPAROSCOPIC CHOLECYSTECTOMY     LAPAROSCOPIC OOPHERECTOMY     LIGAMENT REPAIR Right    wrist   OPEN REDUCTION INTERNAL FIXATION (ORIF) DISTAL RADIAL FRACTURE Left 07/04/2015   WITH ALLOGRAFT BONE GRAFTING    OPEN REDUCTION INTERNAL FIXATION (ORIF) DISTAL RADIAL FRACTURE Left 07/04/2015   Procedure: OPEN REDUCTION INTERNAL FIXATION (ORIF) LEFT DISTAL RADIAL FRACTURE WITH ALLOGRAFT BONE GRAFTING;  Surgeon: Elsie Mussel, MD;  Location: [facility] OR;  Service: Orthopedics;  Laterality: Left;   STRABISMUS SURGERY Bilateral    4 on the left; 1 on right   TUBAL LIGATION      Family Psychiatric History:   Family History:  Family History  Problem Relation Age of Onset   Alzheimer's disease Mother    Breast cancer Mother    Atrial fibrillation  Father    Breast cancer Maternal Grandmother     Social History:  Social History   Socioeconomic History   Marital status: Married    Spouse name: Not on file   Number of children: Not on file   Years of education: Not on file   Highest education level: Not on file  Occupational History   Not on file  Tobacco Use   Smoking status: Every Day    Current packs/day: 0.50    Average packs/day: 0.5 packs/day for 45.0 years (22.5 ttl pk-yrs)    Types: Cigarettes   Smokeless tobacco: Never  Substance and Sexual Activity   Alcohol  use: Yes    Alcohol /week: 28.0 standard drinks of alcohol     Types: 7 Standard drinks or equivalent, 21 Glasses of wine per week    Comment: 2-3 glass wine nightly   Drug use: No   Sexual activity: Yes  Other Topics Concern   Not on file  Social History Narrative   Not on file   Social Drivers of Health   Financial Resource Strain: Not on file  Food Insecurity: Not on file  Transportation Needs: Not on file  Physical Activity: Not on file  Stress: Not on file  Social Connections: Not on file    Allergies:  Allergies  Allergen Reactions   Cefaclor Rash   Cephalosporins Itching    Metabolic Disorder Labs: Lab Results  Component Value Date   HGBA1C 5.1 01/26/2021   MPG 99.67 01/26/2021   No results found for: PROLACTIN Lab Results  Component Value Date   CHOL 196 01/26/2021   TRIG 127 01/26/2021   HDL 72 01/26/2021   CHOLHDL 2.7 01/26/2021   VLDL 25 01/26/2021   LDLCALC 99 01/26/2021   Lab Results  Component Value Date   TSH 2.132 12/29/2020    Therapeutic Level Labs:NA   Current Medications: Current Outpatient Medications  Medication Sig Dispense Refill   amphetamine -dextroamphetamine  (ADDERALL) 30 MG tablet Take 1 tablet by mouth 2 (two) times daily.     denosumab (PROLIA) 60 MG/ML SOSY injection      fluticasone  (FLONASE ) 50 MCG/ACT nasal spray Place 2 sprays into both nostrils daily as needed for allergies.      losartan  (COZAAR ) 100 MG tablet Take 100 mg by mouth daily.     Multiple Vitamins-Minerals (CENTRUM SILVER 50+WOMEN) TABS See admin instructions.     amLODipine  (NORVASC ) 10 MG tablet Take 10 mg by mouth daily.     amphetamine -dextroamphetamine  (ADDERALL) 30 MG tablet 1 tablet     baclofen  (LIORESAL ) 10 MG tablet 1 tablet Orally 3 times a day     cholestyramine (QUESTRAN) 4 GM/DOSE powder 1     Cyanocobalamin (VITAMIN B 12) 500 MCG TABS 1 tablet     folic acid (FOLVITE) 1 MG tablet 1 tablet     Magnesium  400 MG TABS 1 tablet Orally once daily     naltrexone  (DEPADE) 50 MG tablet Take 1 tablet (50 mg total) by mouth daily. 90 tablet 1   QUEtiapine  (SEROQUEL ) 100 MG tablet TAKE 1 TABLET BY MOUTH EVERYDAY AT BEDTIME 90  tablet 0   venlafaxine  XR (EFFEXOR -XR) 75 MG 24 hr capsule Take 1 capsule (75 mg total) by mouth daily for 180 doses. 90 capsule 1   No current facility-administered medications for this visit.     Musculoskeletal: Strength & Muscle Tone: within normal limits Gait & Station: normal Patient leans: N/A  Psychiatric Specialty Exam: Review of Systems  Constitutional:  Positive for activity change (Quit alcohol ). Negative for appetite change, chills, diaphoresis, fatigue, fever and unexpected weight change.  HENT: Negative.    Eyes:  Positive for visual disturbance (Wears glasses). Negative for photophobia, pain, discharge, redness and itching.  Respiratory: Negative.    Cardiovascular:  Negative for chest pain, palpitations and leg swelling.       - PAHWANI, RINKA R 03/09/2024 12:55:51 PM > Please call patient and let her know that her pumping function of the heart is within normal limit. It shows mild to moderate aortic leaky valve. Nothing to worry about.This DI was reviewed by Orene Aas on 03/09/2024 at 13:13 PM EST    Gastrointestinal: Negative.   Endocrine: Negative for cold intolerance, heat intolerance, polydipsia, polyphagia and polyuria.  Genitourinary:  Negative.   Musculoskeletal:  Positive for arthralgias, gait problem and joint swelling. Negative for back pain, myalgias, neck pain and neck stiffness.  Skin: Negative.   Allergic/Immunologic: Negative.   Neurological:  Negative for dizziness, tremors, seizures, syncope, facial asymmetry, speech difficulty, weakness, light-headedness, numbness and headaches.  Psychiatric/Behavioral:  Positive for dysphoric mood and sleep disturbance. Negative for agitation (ADD), behavioral problems, confusion, decreased concentration, hallucinations, self-injury and suicidal ideas. The patient is nervous/anxious. The patient is not hyperactive.     Blood pressure (!) 160/67, pulse 94, height 5' 2 (1.575 m), weight 151 lb (68.5 kg).Body mass index is 27.62 kg/m.  Screenings: AUDIT    Advertising Copywriter from 02/14/2021 in [facility] Admission (Discharged) from OP [facility]  Alcohol  Use Disorder Identification Test Final Score (AUDIT) 36 40   GAD-7    Flowsheet Row Counselor from 02/14/2021 in [facility] at [facility]  Total GAD-7 Score 21   PHQ2-9    Flowsheet Row Counselor from 02/14/2021 in [facility] Admission (Discharged) from OP Visit [facility]  PHQ-2 Total Score 5 3  PHQ-9 Total Score 23 11   Flowsheet Row Counselor from 02/14/2021 in [facility] at [facility] Admission (Discharged) from OP Visit from [facility] UC from 12/29/2020 in [facility] RISK CATEGORY No Risk No Risk No Risk     Assessment and Plan:     Consent: Patient/Guardian gives verbal consent for treatment and assignment of benefits for services provided during this visit.  Patient/Guardian expressed understanding and agreed to proceed.    Carlin Emmer, PA-C 03/11/2024, 5:20 PM

## 2024-03-16 ENCOUNTER — Other Ambulatory Visit: Payer: Self-pay | Admitting: Orthopedic Surgery

## 2024-03-18 NOTE — Care Plan (Addendum)
 Ortho Bundle Case Management Note  Patient Details  Name: Tiffany Mueller MRN: [id number] Date of Birth: 21-Jul-1955  met with patient in the office for H&P. will discharge to home with family to assist. has DME. OPPT set up with [facility] discharge instructions discussed and questions answered. Patient and MD in agreement with plan. Choice offered.   04/15/24 - note reviewed with no change in plan. Surgery rescheduled from original date due to illness.                    DME Arranged:    DME Agency:     HH Arranged:    HH Agency:     Additional Comments: Please contact me with any questions of if this plan should need to change.  Charlies Pitch,  RN,BSN,MHA,CCM  [facility] Orthopaedic Specialist  [phone] 03/18/2024, 2:22 PM

## 2024-03-22 NOTE — Patient Instructions (Signed)
 SURGICAL WAITING ROOM VISITATION  Patients having surgery or a procedure may have no more than 2 support people in the waiting area - these visitors may rotate.    Children ages 34 and under will not be able to visit patients in [facility] under most circumstances.   Visitors with respiratory illnesses are discouraged from visiting and should remain at home.  If the patient needs to stay at the hospital during part of their recovery, the visitor guidelines for inpatient rooms apply. Pre-op nurse will coordinate an appropriate time for 1 support person to accompany patient in pre-op.  This support person may not rotate.    Please refer to the [facility] website for the visitor guidelines for Inpatients (after your surgery is over and you are in a regular room).       Your procedure is scheduled on:   03/26/2024    Report to [facility] Main Entrance    Report to admitting at   1130AM   Call this number if you have problems the morning of surgery [phone]   Do not eat food :After Midnight.   After Midnight you may have the following liquids until __ 1100____ AM DAY OF SURGERY  Water Non-Citrus Juices (without pulp, NO RED-Apple, White grape, White cranberry) Black Coffee (NO MILK/CREAM OR CREAMERS, sugar ok)  Clear Tea (NO MILK/CREAM OR CREAMERS, sugar ok) regular and decaf                             Plain Jell-O (NO RED)                                           Fruit ices (not with fruit pulp, NO RED)                                     Popsicles (NO RED)                                                               Sports drinks like Gatorade (NO RED)                   The day of surgery:  Drink ONE (1) Pre-Surgery Clear Ensure or G2 at  1100 AM the morning of surgery. Drink in one sitting. Do not sip.  This drink was given to you during your hospital  pre-op appointment visit. Nothing else to drink after completing the  Pre-Surgery Clear Ensure  or G2.          If you have questions, please contact your surgeons office.      Oral Hygiene is also important to reduce your risk of infection.                                    Remember - BRUSH YOUR TEETH THE MORNING OF SURGERY WITH YOUR REGULAR TOOTHPASTE  DENTURES WILL BE REMOVED PRIOR TO SURGERY PLEASE DO NOT APPLY Poly  grip OR ADHESIVES!!!   Do NOT smoke after Midnight   Stop all vitamins and herbal supplements 7 days before surgery.   Take these medicines the morning of surgery with A SIP OF WATER:  amlodipine , adderall if needed, flonase  if needed, magnesium , effexor    DO NOT TAKE ANY ORAL DIABETIC MEDICATIONS DAY OF YOUR SURGERY  Bring CPAP mask and tubing day of surgery.                              You may not have any metal on your body including hair pins, jewelry, and body piercing             Do not wear make-up, lotions, powders, perfumes/cologne, or deodorant  Do not wear nail polish including gel and S&S, artificial/acrylic nails, or any other type of covering on natural nails including finger and toenails. If you have artificial nails, gel coating, etc. that needs to be removed by a nail salon please have this removed prior to surgery or surgery may need to be canceled/ delayed if the surgeon/ anesthesia feels like they are unable to be safely monitored.   Do not shave  48 hours prior to surgery.               Men may shave face and neck.   Do not bring valuables to the hospital. [facility] IS NOT             RESPONSIBLE   FOR VALUABLES.   Contacts, glasses, dentures or bridgework may not be worn into surgery.   Bring small overnight bag day of surgery.   DO NOT BRING YOUR HOME MEDICATIONS TO THE HOSPITAL. PHARMACY WILL DISPENSE MEDICATIONS LISTED ON YOUR MEDICATION LIST TO YOU DURING YOUR ADMISSION IN THE HOSPITAL!    Patients discharged on the day of surgery will not be allowed to drive home.  Someone NEEDS to stay with you for the first 24 hours  after anesthesia.   Special Instructions: Bring a copy of your healthcare power of attorney and living will documents the day of surgery if you haven't scanned them before.              Please read over the following fact sheets you were given: IF YOU HAVE QUESTIONS ABOUT YOUR PRE-OP INSTRUCTIONS PLEASE CALL [phone].   If you received a COVID test during your pre-op visit  it is requested that you wear a mask when out in public, stay away from anyone that may not be feeling well and notify your surgeon if you develop symptoms. If you test positive for Covid or have been in contact with anyone that has tested positive in the last 10 days please notify you surgeon.      Pre-operative 4 CHG Bath Instructions   You can play a key role in reducing the risk of infection after surgery. Your skin needs to be as free of germs as possible. You can reduce the number of germs on your skin by washing with CHG (chlorhexidine  gluconate) soap before surgery. CHG is an antiseptic soap that kills germs and continues to kill germs even after washing.   DO NOT use if you have an allergy to chlorhexidine /CHG or antibacterial soaps. If your skin becomes reddened or irritated, stop using the CHG and notify one of our RNs at [phone].   Please shower with the CHG soap starting 4 days before surgery using the following schedule:  Please keep in mind the following:  DO NOT shave, including legs and underarms, starting the day of your first shower.   You may shave your face at any point before/day of surgery.  Place clean sheets on your bed the day you start using CHG soap. Use a clean washcloth (not used since being washed) for each shower. DO NOT sleep with pets once you start using the CHG.   CHG Shower Instructions:  If you choose to wash your hair and private area, wash first with your normal shampoo/soap.  After you use shampoo/soap, rinse your hair and body thoroughly to remove shampoo/soap  residue.  Turn the water OFF and apply about 3 tablespoons (45 ml) of CHG soap to a CLEAN washcloth.  Apply CHG soap ONLY FROM YOUR NECK DOWN TO YOUR TOES (washing for 3-5 minutes)  DO NOT use CHG soap on face, private areas, open wounds, or sores.  Pay special attention to the area where your surgery is being performed.  If you are having back surgery, having someone wash your back for you may be helpful. Wait 2 minutes after CHG soap is applied, then you may rinse off the CHG soap.  Pat dry with a clean towel  Put on clean clothes/pajamas   If you choose to wear lotion, please use ONLY the CHG-compatible lotions on the back of this paper.     Additional instructions for the day of surgery: DO NOT APPLY any lotions, deodorants, cologne, or perfumes.   Put on clean/comfortable clothes.  Brush your teeth.  Ask your nurse before applying any prescription medications to the skin.      CHG Compatible Lotions   Aveeno Moisturizing lotion  Cetaphil Moisturizing Cream  Cetaphil Moisturizing Lotion  Clairol Herbal Essence Moisturizing Lotion, Dry Skin  Clairol Herbal Essence Moisturizing Lotion, Extra Dry Skin  Clairol Herbal Essence Moisturizing Lotion, Normal Skin  Curel Age Defying Therapeutic Moisturizing Lotion with Alpha Hydroxy  Curel Extreme Care Body Lotion  Curel Soothing Hands Moisturizing Hand Lotion  Curel Therapeutic Moisturizing Cream, Fragrance-Free  Curel Therapeutic Moisturizing Lotion, Fragrance-Free  Curel Therapeutic Moisturizing Lotion, Original Formula  Eucerin Daily Replenishing Lotion  Eucerin Dry Skin Therapy Plus Alpha Hydroxy Crme  Eucerin Dry Skin Therapy Plus Alpha Hydroxy Lotion  Eucerin Original Crme  Eucerin Original Lotion  Eucerin Plus Crme Eucerin Plus Lotion  Eucerin TriLipid Replenishing Lotion  Keri Anti-Bacterial Hand Lotion  Keri Deep Conditioning Original Lotion Dry Skin Formula Softly Scented  Keri Deep Conditioning Original Lotion,  Fragrance Free Sensitive Skin Formula  Keri Lotion Fast Absorbing Fragrance Free Sensitive Skin Formula  Keri Lotion Fast Absorbing Softly Scented Dry Skin Formula  Keri Original Lotion  Keri Skin Renewal Lotion Keri Silky Smooth Lotion  Keri Silky Smooth Sensitive Skin Lotion  Nivea Body Creamy Conditioning Oil  Nivea Body Extra Enriched Teacher, Adult Education Moisturizing Lotion Nivea Crme  Nivea Skin Firming Lotion  NutraDerm 30 Skin Lotion  NutraDerm Skin Lotion  NutraDerm Therapeutic Skin Cream  NutraDerm Therapeutic Skin Lotion  ProShield Protective Hand Cream  Provon moisturizing lotion

## 2024-03-22 NOTE — Progress Notes (Signed)
 Anesthesia Review:  PCP: Cardiologist :  PPM/ ICD: Device Orders: Rep Notified:  Chest x-ray : Ct chest-10/29/23  EKG : Echo : Stress test: Cardiac Cath :   Activity level:  Sleep Study/ CPAP : Fasting Blood Sugar :      / Checks Blood Sugar -- times a day:    Blood Thinner/ Instructions /Last Dose: ASA / Instructions/ Last Dose :

## 2024-03-23 ENCOUNTER — Encounter (HOSPITAL_COMMUNITY): Payer: Self-pay

## 2024-03-23 ENCOUNTER — Encounter (HOSPITAL_COMMUNITY)
Admission: RE | Admit: 2024-03-23 | Discharge: 2024-03-23 | Disposition: A | Source: Ambulatory Visit | Attending: Orthopedic Surgery

## 2024-03-23 ENCOUNTER — Other Ambulatory Visit: Payer: Self-pay

## 2024-03-23 VITALS — BP 129/59 | HR 73 | Temp 99.6°F | Resp 16 | Ht 62.0 in | Wt 144.0 lb

## 2024-03-23 DIAGNOSIS — Z01818 Encounter for other preprocedural examination: Secondary | ICD-10-CM | POA: Insufficient documentation

## 2024-03-23 HISTORY — DX: Anemia, unspecified: D64.9

## 2024-03-23 HISTORY — DX: Other complications of anesthesia, initial encounter: T88.59XA

## 2024-03-23 HISTORY — DX: Cardiac murmur, unspecified: R01.1

## 2024-03-23 LAB — BASIC METABOLIC PANEL WITH GFR
Anion gap: 10 (ref 5–15)
BUN: 26 mg/dL — ABNORMAL HIGH (ref 8–23)
CO2: 26 mmol/L (ref 22–32)
Calcium: 9.5 mg/dL (ref 8.9–10.3)
Chloride: 101 mmol/L (ref 98–111)
Creatinine, Ser: 1.33 mg/dL — ABNORMAL HIGH (ref 0.44–1.00)
GFR, Estimated: 43 mL/min — ABNORMAL LOW (ref >60)
Glucose, Bld: 105 mg/dL — ABNORMAL HIGH (ref 70–99)
Potassium: 4.8 mmol/L (ref 3.5–5.1)
Sodium: 137 mmol/L (ref 135–145)

## 2024-03-23 LAB — CBC
HCT: 36.5 % (ref 36.0–46.0)
Hemoglobin: 12 g/dL (ref 12.0–15.0)
MCH: 30 pg (ref 26.0–34.0)
MCHC: 32.9 g/dL (ref 30.0–36.0)
MCV: 91.3 fL (ref 80.0–100.0)
Platelets: 268 K/uL (ref 150–400)
RBC: 4 MIL/uL (ref 3.87–5.11)
RDW: 13.1 % (ref 11.5–15.5)
WBC: 6.4 K/uL (ref 4.0–10.5)
nRBC: 0 % (ref 0.0–0.2)

## 2024-03-23 LAB — SURGICAL PCR SCREEN
MRSA, PCR: NEGATIVE
Staphylococcus aureus: NEGATIVE

## 2024-04-12 ENCOUNTER — Other Ambulatory Visit: Payer: Self-pay | Admitting: Orthopedic Surgery

## 2024-04-15 ENCOUNTER — Telehealth (HOSPITAL_COMMUNITY): Admitting: Medical

## 2024-04-15 ENCOUNTER — Encounter (HOSPITAL_COMMUNITY): Payer: Self-pay

## 2024-04-22 ENCOUNTER — Telehealth (HOSPITAL_COMMUNITY): Admitting: Medical

## 2024-04-22 DIAGNOSIS — Z90710 Acquired absence of both cervix and uterus: Secondary | ICD-10-CM | POA: Diagnosis not present

## 2024-04-22 DIAGNOSIS — F331 Major depressive disorder, recurrent, moderate: Secondary | ICD-10-CM

## 2024-04-22 DIAGNOSIS — F50814 Binge eating disorder, in remission: Secondary | ICD-10-CM | POA: Diagnosis not present

## 2024-04-22 DIAGNOSIS — F10982 Alcohol use, unspecified with alcohol-induced sleep disorder: Secondary | ICD-10-CM

## 2024-04-22 DIAGNOSIS — F1021 Alcohol dependence, in remission: Secondary | ICD-10-CM

## 2024-04-22 DIAGNOSIS — F1994 Other psychoactive substance use, unspecified with psychoactive substance-induced mood disorder: Secondary | ICD-10-CM

## 2024-04-22 DIAGNOSIS — F1321 Sedative, hypnotic or anxiolytic dependence, in remission: Secondary | ICD-10-CM

## 2024-04-22 DIAGNOSIS — F4312 Post-traumatic stress disorder, chronic: Secondary | ICD-10-CM

## 2024-04-22 DIAGNOSIS — F50811 Binge eating disorder, moderate: Secondary | ICD-10-CM | POA: Diagnosis not present

## 2024-04-22 DIAGNOSIS — Z8659 Personal history of other mental and behavioral disorders: Secondary | ICD-10-CM

## 2024-04-22 DIAGNOSIS — T7492XS Unspecified child maltreatment, confirmed, sequela: Secondary | ICD-10-CM

## 2024-04-22 MED ORDER — QUETIAPINE FUMARATE 200 MG PO TABS: 200.0000 mg | ORAL_TABLET | Freq: Two times a day (BID) | ORAL | 2 refills | Status: AC
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
## 2024-04-23 NOTE — Patient Instructions (Signed)
 SURGICAL WAITING ROOM VISITATION  Patients having surgery or a procedure may have no more than 2 support people in the waiting area - these visitors may rotate.    Children ages 44 and under will not be able to visit patients in [facility] under most circumstances.   Visitors with respiratory illnesses are discouraged from visiting and should remain at home.  If the patient needs to stay at the hospital during part of their recovery, the visitor guidelines for inpatient rooms apply. Pre-op nurse will coordinate an appropriate time for 1 support person to accompany patient in pre-op.  This support person may not rotate.    Please refer to the [facility] website for the visitor guidelines for Inpatients (after your surgery is over and you are in a regular room).       Your procedure is scheduled on:   05/03/24    Report to [facility] Main Entrance    Report to admitting at   [phone]   Call this number if you have problems the morning of surgery [phone]   Do not eat food :After Midnight.   After Midnight you may have the following liquids until _  0745_____ AM  DAY OF SURGERY  Water Non-Citrus Juices (without pulp, NO RED-Apple, White grape, White cranberry) Black Coffee (NO MILK/CREAM OR CREAMERS, sugar ok)  Clear Tea (NO MILK/CREAM OR CREAMERS, sugar ok) regular and decaf                             Plain Jell-O (NO RED)                                           Fruit ices (not with fruit pulp, NO RED)                                     Popsicles (NO RED)                                                               Sports drinks like Gatorade (NO RED)                    The day of surgery:  Drink ONE (1) Pre-Surgery Clear Ensure or G2 at  0745 AM the morning of surgery. Drink in one sitting. Do not sip.  This drink was given to you during your hospital  pre-op appointment visit. Nothing else to drink after completing the  Pre-Surgery Clear Ensure  or G2.          If you have questions, please contact your surgeons office.       Oral Hygiene is also important to reduce your risk of infection.                                    Remember - BRUSH YOUR TEETH THE MORNING OF SURGERY WITH YOUR REGULAR TOOTHPASTE  DENTURES WILL BE REMOVED PRIOR TO SURGERY PLEASE  DO NOT APPLY Poly grip OR ADHESIVES!!!   Do NOT smoke after Midnight   Stop all vitamins and herbal supplements 7 days before surgery.   Take these medicines the morning of surgery with A SIP OF WATER:  amlodipine , magnesium , effexor    DO NOT TAKE ANY ORAL DIABETIC MEDICATIONS DAY OF YOUR SURGERY  Bring CPAP mask and tubing day of surgery.                              You may not have any metal on your body including hair pins, jewelry, and body piercing             Do not wear make-up, lotions, powders, perfumes/cologne, or deodorant  Do not wear nail polish including gel and S&S, artificial/acrylic nails, or any other type of covering on natural nails including finger and toenails. If you have artificial nails, gel coating, etc. that needs to be removed by a nail salon please have this removed prior to surgery or surgery may need to be canceled/ delayed if the surgeon/ anesthesia feels like they are unable to be safely monitored.   Do not shave  48 hours prior to surgery.               Men may shave face and neck.   Do not bring valuables to the hospital. [facility] IS NOT             RESPONSIBLE   FOR VALUABLES.   Contacts, glasses, dentures or bridgework may not be worn into surgery.   Bring small overnight bag day of surgery.   DO NOT BRING YOUR HOME MEDICATIONS TO THE HOSPITAL. PHARMACY WILL DISPENSE MEDICATIONS LISTED ON YOUR MEDICATION LIST TO YOU DURING YOUR ADMISSION IN THE HOSPITAL!    Patients discharged on the day of surgery will not be allowed to drive home.  Someone NEEDS to stay with you for the first 24 hours after anesthesia.   Special  Instructions: Bring a copy of your healthcare power of attorney and living will documents the day of surgery if you haven't scanned them before.              Please read over the following fact sheets you were given: IF YOU HAVE QUESTIONS ABOUT YOUR PRE-OP INSTRUCTIONS PLEASE CALL [phone].   If you received a COVID test during your pre-op visit  it is requested that you wear a mask when out in public, stay away from anyone that may not be feeling well and notify your surgeon if you develop symptoms. If you test positive for Covid or have been in contact with anyone that has tested positive in the last 10 days please notify you surgeon.      Pre-operative 4 CHG Bath Instructions   You can play a key role in reducing the risk of infection after surgery. Your skin needs to be as free of germs as possible. You can reduce the number of germs on your skin by washing with CHG (chlorhexidine  gluconate) soap before surgery. CHG is an antiseptic soap that kills germs and continues to kill germs even after washing.   DO NOT use if you have an allergy to chlorhexidine /CHG or antibacterial soaps. If your skin becomes reddened or irritated, stop using the CHG and notify one of our RNs at [phone].   Please shower with the CHG soap starting 4 days before surgery using the following schedule:  Please keep in mind the following:  DO NOT shave, including legs and underarms, starting the day of your first shower.   You may shave your face at any point before/day of surgery.  Place clean sheets on your bed the day you start using CHG soap. Use a clean washcloth (not used since being washed) for each shower. DO NOT sleep with pets once you start using the CHG.   CHG Shower Instructions:  If you choose to wash your hair and private area, wash first with your normal shampoo/soap.  After you use shampoo/soap, rinse your hair and body thoroughly to remove shampoo/soap residue.  Turn the water OFF and  apply about 3 tablespoons (45 ml) of CHG soap to a CLEAN washcloth.  Apply CHG soap ONLY FROM YOUR NECK DOWN TO YOUR TOES (washing for 3-5 minutes)  DO NOT use CHG soap on face, private areas, open wounds, or sores.  Pay special attention to the area where your surgery is being performed.  If you are having back surgery, having someone wash your back for you may be helpful. Wait 2 minutes after CHG soap is applied, then you may rinse off the CHG soap.  Pat dry with a clean towel  Put on clean clothes/pajamas   If you choose to wear lotion, please use ONLY the CHG-compatible lotions on the back of this paper.     Additional instructions for the day of surgery: DO NOT APPLY any lotions, deodorants, cologne, or perfumes.   Put on clean/comfortable clothes.  Brush your teeth.  Ask your nurse before applying any prescription medications to the skin.      CHG Compatible Lotions   Aveeno Moisturizing lotion  Cetaphil Moisturizing Cream  Cetaphil Moisturizing Lotion  Clairol Herbal Essence Moisturizing Lotion, Dry Skin  Clairol Herbal Essence Moisturizing Lotion, Extra Dry Skin  Clairol Herbal Essence Moisturizing Lotion, Normal Skin  Curel Age Defying Therapeutic Moisturizing Lotion with Alpha Hydroxy  Curel Extreme Care Body Lotion  Curel Soothing Hands Moisturizing Hand Lotion  Curel Therapeutic Moisturizing Cream, Fragrance-Free  Curel Therapeutic Moisturizing Lotion, Fragrance-Free  Curel Therapeutic Moisturizing Lotion, Original Formula  Eucerin Daily Replenishing Lotion  Eucerin Dry Skin Therapy Plus Alpha Hydroxy Crme  Eucerin Dry Skin Therapy Plus Alpha Hydroxy Lotion  Eucerin Original Crme  Eucerin Original Lotion  Eucerin Plus Crme Eucerin Plus Lotion  Eucerin TriLipid Replenishing Lotion  Keri Anti-Bacterial Hand Lotion  Keri Deep Conditioning Original Lotion Dry Skin Formula Softly Scented  Keri Deep Conditioning Original Lotion, Fragrance Free Sensitive Skin  Formula  Keri Lotion Fast Absorbing Fragrance Free Sensitive Skin Formula  Keri Lotion Fast Absorbing Softly Scented Dry Skin Formula  Keri Original Lotion  Keri Skin Renewal Lotion Keri Silky Smooth Lotion  Keri Silky Smooth Sensitive Skin Lotion  Nivea Body Creamy Conditioning Oil  Nivea Body Extra Enriched Teacher, Adult Education Moisturizing Lotion Nivea Crme  Nivea Skin Firming Lotion  NutraDerm 30 Skin Lotion  NutraDerm Skin Lotion  NutraDerm Therapeutic Skin Cream  NutraDerm Therapeutic Skin Lotion  ProShield Protective Hand Cream  Provon moisturizing lotion

## 2024-04-23 NOTE — Progress Notes (Signed)
 Anesthesia Review:  PCP: Cardiologist :  PPM/ ICD: Device Orders: Rep Notified:  Chest x-ray : CT Chest- 10/29/23  EKG :  03/23/2024  Echo : 2016  Stress test: Cardiac Cath :   Activity level:  Sleep Study/ CPAP : Fasting Blood Sugar :      / Checks Blood Sugar -- times a day:    Blood Thinner/ Instructions /Last Dose: ASA / Instructions/ Last Dose :    03/23/24- original preop , uri symptoms

## 2024-04-26 ENCOUNTER — Encounter (HOSPITAL_COMMUNITY): Payer: Self-pay | Admitting: Medical

## 2024-04-27 ENCOUNTER — Ambulatory Visit (HOSPITAL_COMMUNITY)
Admission: RE | Admit: 2024-04-27 | Discharge: 2024-04-27 | Disposition: A | Discharge status: Home / Self Care | Source: Ambulatory Visit | Attending: Orthopedic Surgery | Admitting: Orthopedic Surgery

## 2024-04-27 ENCOUNTER — Encounter (HOSPITAL_COMMUNITY)
Admission: RE | Admit: 2024-04-27 | Discharge: 2024-04-27 | Disposition: A | Source: Ambulatory Visit | Attending: Orthopedic Surgery

## 2024-04-27 ENCOUNTER — Other Ambulatory Visit: Payer: Self-pay

## 2024-04-27 ENCOUNTER — Encounter (HOSPITAL_COMMUNITY): Payer: Self-pay

## 2024-04-27 VITALS — BP 144/64 | HR 67 | Temp 98.7°F | Resp 16 | Ht 62.0 in | Wt 154.0 lb

## 2024-04-27 DIAGNOSIS — Z01818 Encounter for other preprocedural examination: Secondary | ICD-10-CM

## 2024-04-27 HISTORY — DX: Chronic kidney disease, unspecified: N18.9

## 2024-04-27 HISTORY — DX: Nonrheumatic aortic (valve) stenosis: I35.0

## 2024-04-27 LAB — CBC
HCT: 36.7 % (ref 36.0–46.0)
Hemoglobin: 11.6 g/dL — ABNORMAL LOW (ref 12.0–15.0)
MCH: 29.4 pg (ref 26.0–34.0)
MCHC: 31.6 g/dL (ref 30.0–36.0)
MCV: 92.9 fL (ref 80.0–100.0)
Platelets: 282 K/uL (ref 150–400)
RBC: 3.95 MIL/uL (ref 3.87–5.11)
RDW: 13.8 % (ref 11.5–15.5)
WBC: 6.4 K/uL (ref 4.0–10.5)
nRBC: 0 % (ref 0.0–0.2)

## 2024-04-27 LAB — BASIC METABOLIC PANEL WITH GFR
Anion gap: 9 (ref 5–15)
BUN: 27 mg/dL — ABNORMAL HIGH (ref 8–23)
CO2: 24 mmol/L (ref 22–32)
Calcium: 9 mg/dL (ref 8.9–10.3)
Chloride: 106 mmol/L (ref 98–111)
Creatinine, Ser: 1.27 mg/dL — ABNORMAL HIGH (ref 0.44–1.00)
GFR, Estimated: 46 mL/min — ABNORMAL LOW
Glucose, Bld: 109 mg/dL — ABNORMAL HIGH (ref 70–99)
Potassium: 4.7 mmol/L (ref 3.5–5.1)
Sodium: 139 mmol/L (ref 135–145)

## 2024-04-27 LAB — SURGICAL PCR SCREEN
MRSA, PCR: NEGATIVE
Staphylococcus aureus: NEGATIVE

## 2024-04-28 ENCOUNTER — Encounter (HOSPITAL_COMMUNITY): Payer: Self-pay

## 2024-04-28 NOTE — Anesthesia Preprocedure Evaluation (Addendum)
"                                    Anesthesia Evaluation  Patient identified by MRN, date of birth, ID band Patient awake    Reviewed: Allergy & Precautions, NPO status , Patient's Chart, lab work & pertinent test results  Airway Mallampati: III  TM Distance: >3 FB Neck ROM: Full    Dental  (+) Edentulous Lower, Edentulous Upper   Pulmonary Current Smoker and Patient abstained from smoking.   Pulmonary exam normal        Cardiovascular hypertension, Pt. on medications Normal cardiovascular exam     Neuro/Psych  PSYCHIATRIC DISORDERS Anxiety Depression     Neuromuscular disease    GI/Hepatic negative GI ROS, Neg liver ROS,,,  Endo/Other  negative endocrine ROS    Renal/GU Renal diseasenegative Renal ROS     Musculoskeletal   Abdominal   Peds  (+) ATTENTION DEFICIT DISORDER WITHOUT HYPERACTIVITY Hematology negative hematology ROS (+)   Anesthesia Other Findings RIGHT KNEE DEGENERATIVE JOINT DISEASE  Reproductive/Obstetrics                              Anesthesia Physical Anesthesia Plan  ASA: 3  Anesthesia Plan: Regional and Spinal   Post-op Pain Management: Regional block*   Induction: Intravenous  PONV Risk Score and Plan: 1 and Ondansetron , Dexamethasone , Propofol  infusion, Midazolam  and Treatment may vary due to age or medical condition  Airway Management Planned: Simple Face Mask  Additional Equipment:   Intra-op Plan:   Post-operative Plan:   Informed Consent: I have reviewed the patients History and Physical, chart, labs and discussed the procedure including the risks, benefits and alternatives for the proposed anesthesia with the patient or authorized representative who has indicated his/her understanding and acceptance.     Dental advisory given  Plan Discussed with: CRNA  Anesthesia Plan Comments: (PAT note from 1/20)         Anesthesia Quick Evaluation  "

## 2024-04-28 NOTE — Progress Notes (Signed)
 " Case: [id number] Date/Time: 05/03/24 1015   Procedure: ARTHROPLASTY, KNEE, TOTAL (Right: Knee)   Anesthesia type: Spinal   Pre-op diagnosis: RIGHT KNEE DEGENERATIVE JOINT DISEASE   Location: [facility] ROOM 06 / [facility]   Surgeons: Yvone Rush, MD       DISCUSSION: Tiffany Mueller is a 69 yo female with PMH of current smoking, HTN, mild aortic stenosis, mild-mod AI, mild mitral regurgitation, s/p gastric bypass, hx of ETOH abuse, CKD3, anxiety, arthritis.  Patient rescheduled from December 2025 due to URI.  Last seen by PCP on 03/22/2024 for preop exam.  Noted that she had been sober for 2 years but recently started back using alcohol  due to stress.  Echo was ordered by PCP in November 2025 due to heart murmur.  Report scanned in media and shows normal LVEF 55%, grade 2 diastolic dysfunction, mild aortic stenosis with mean gradient 16 mmHg, mild-mod AI, mild mitral regurgitation.  Seen by Psychiatry on 12/4. Pt reported no ETOH use again.  VS: BP (!) 144/64   Pulse 67   Temp 37.1 C (Oral)   Resp 16   Ht 5' 2 (1.575 m)   Wt 69.9 kg   SpO2 100%   BMI 28.17 kg/m   PROVIDERS: Pahwani, Velna SAUNDERS, MD   LABS: Labs reviewed: Acceptable for surgery. (all labs ordered are listed, but only abnormal results are displayed)  Labs Reviewed  BASIC METABOLIC PANEL WITH GFR - Abnormal; Notable for the following components:      Result Value   Glucose, Bld 109 (*)    BUN 27 (*)    Creatinine, Ser 1.27 (*)    GFR, Estimated 46 (*)    All other components within normal limits  CBC - Abnormal; Notable for the following components:   Hemoglobin 11.6 (*)    All other components within normal limits  SURGICAL PCR SCREEN     CXR 04/27/24:   FINDINGS: The cardiomediastinal contours are normal. The lungs are clear. Pulmonary vasculature is normal. No consolidation, pleural effusion, or pneumothorax. No acute osseous abnormalities are seen. Dextroscoliotic curvature of the thoracic spine. Mild  chronic appearing wedging of midthoracic vertebra   IMPRESSION: No active cardiopulmonary disease.   EKG 03/23/24:  NSR   Echo ([facility] - scanned in media on 03/24/24):  Conclusions:  Left ventricular cavity size is normal Mild concentric hypertrophy of the left ventricle Normal global wall function Doppler evidence of grade 2 (pseudonormal) diastolic dysfunction Calculated EF of 55% Structurally normal trileaflet aortic valve with mild to moderate regurgitation Trace aortic stenosis Mild mitral regurgitation Mild mitral valve leaflet thickening Structurally normal tricuspid valve with mild regurgitation Mild pulmonary hypertension Dilated pulmonary artery    Past Medical History:  Diagnosis Date   ADD (attention deficit disorder)    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   EGD dilation   Anemia    Anxiety    Arthritis    Carpal tunnel syndrome    Complication of anesthesia    trouble breathing after gastric bypass surgery none since   Depression    Heart murmur    Hypertension    Osteoporosis     Past Surgical History:  Procedure Laterality Date   ABDOMINAL HYSTERECTOMY  2007   APPENDECTOMY     CARPAL TUNNEL RELEASE Left 07/04/2015   CARPAL TUNNEL RELEASE Left 07/04/2015   Procedure: LEFT CARPAL TUNNEL RELEASE;  Surgeon: Elsie Mussel, MD;  Location: [facility];  Service: Orthopedics;  Laterality: Left;   ESOPHAGOGASTRODUODENOSCOPY (EGD) WITH ESOPHAGEAL  DILATION  2002   Dr Celestia.  GJ stricture   FRACTURE SURGERY     GASTRIC BYPASS  2001   [facility]   LAPAROSCOPIC CHOLECYSTECTOMY     LAPAROSCOPIC OOPHERECTOMY     LIGAMENT REPAIR Right    wrist   OPEN REDUCTION INTERNAL FIXATION (ORIF) DISTAL RADIAL FRACTURE Left 07/04/2015   WITH ALLOGRAFT BONE GRAFTING    OPEN REDUCTION INTERNAL FIXATION (ORIF) DISTAL RADIAL FRACTURE Left 07/04/2015   Procedure: OPEN REDUCTION INTERNAL FIXATION (ORIF) LEFT DISTAL RADIAL FRACTURE WITH ALLOGRAFT BONE  GRAFTING;  Surgeon: Elsie Mussel, MD;  Location: [facility];  Service: Orthopedics;  Laterality: Left;   STRABISMUS SURGERY Bilateral    4 on the left; 1 on right   TUBAL LIGATION      MEDICATIONS:  acidophilus (RISAQUAD) CAPS capsule   amLODipine  (NORVASC ) 10 MG tablet   amphetamine -dextroamphetamine  (ADDERALL) 30 MG tablet   Cyanocobalamin ([employer] B12) 5000 MCG CHEW   denosumab (PROLIA) 60 MG/ML SOSY injection   ferrous sulfate 325 (65 FE) MG tablet   fluticasone  (FLONASE ) 50 MCG/ACT nasal spray   losartan  (COZAAR ) 100 MG tablet   MAGNESIUM  PO   naltrexone  (DEPADE) 50 MG tablet   QUEtiapine  (SEROQUEL ) 200 MG tablet   venlafaxine  XR (EFFEXOR -XR) 75 MG 24 hr capsule   No current facility-administered medications for this encounter.   Burnard CHRISTELLA Odis DEVONNA [facility] Short Stay/Anesthesiology [facility] Phone [phone] 04/28/2024 12:44 PM         "

## 2024-04-29 DIAGNOSIS — M1711 Unilateral primary osteoarthritis, right knee: Secondary | ICD-10-CM | POA: Diagnosis present

## 2024-04-29 NOTE — H&P (Signed)
 TOTAL KNEE ADMISSION H&P  Patient is being admitted for right total knee arthroplasty.  Subjective:  Chief Complaint:right knee pain.  HPI: Tiffany Mueller, 69 y.o. female, has a history of pain and functional disability in the right knee due to arthritis and has failed non-surgical conservative treatments for greater than 12 weeks to includeNSAID's and/or analgesics, corticosteriod injections, flexibility and strengthening excercises, use of assistive devices, and activity modification.  Onset of symptoms was gradual, starting several years ago with gradually worsening course since that time. The patient noted no past surgery on the right knee(s).  Patient currently rates pain in the right knee(s) at 10 out of 10 with activity. Patient has night pain, worsening of pain with activity and weight bearing, pain that interferes with activities of daily living, pain with passive range of motion, crepitus, and joint swelling.     There is no active infection.  Patient Active Problem List   Diagnosis Date Noted   Osteoarthritis of right knee 04/29/2024   Major depressive disorder, recurrent episode, severe (HCC) 01/26/2021   Alcohol  use disorder, severe, dependence (HCC) 01/25/2021   Spinal stenosis of lumbar region 10/13/2017   Lumbar radiculopathy 07/11/2017   Degeneration of lumbar intervertebral disc 06/16/2017   Low back pain 06/16/2017   Osteoporosis 06/16/2017   Distal radius fracture, left 07/04/2015   Ileitis 11/08/2014   History of gastric bypass 2001 11/08/2014   Abdominal pain 11/08/2014   Never has had a colonoscopy 11/08/2014   Hypertension    Essential hypertension    Past Medical History:  Diagnosis Date   ADD (attention deficit disorder)    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   EGD dilation   Anemia    Anxiety    Aortic stenosis    Arthritis    Carpal tunnel syndrome    CKD (chronic kidney disease)    Complication of anesthesia    trouble breathing  after gastric bypass surgery none since   Depression    Heart murmur    Hypertension    Osteoporosis     Past Surgical History:  Procedure Laterality Date   ABDOMINAL HYSTERECTOMY  2007   APPENDECTOMY     CARPAL TUNNEL RELEASE Left 07/04/2015   CARPAL TUNNEL RELEASE Left 07/04/2015   Procedure: LEFT CARPAL TUNNEL RELEASE;  Surgeon: Elsie Mussel, MD;  Location[facility];  Service: Orthopedics;  Laterality: Left;   ESOPHAGOGASTRODUODENOSCOPY (EGD) WITH ESOPHAGEAL DILATION  2002   Dr Celestia.  GJ stricture   FRACTURE SURGERY     GASTRIC BYPASS  2001   [facility]   LAPAROSCOPIC CHOLECYSTECTOMY     LAPAROSCOPIC OOPHERECTOMY     LIGAMENT REPAIR Right    wrist   OPEN REDUCTION INTERNAL FIXATION (ORIF) DISTAL RADIAL FRACTURE Left 07/04/2015   WITH ALLOGRAFT BONE GRAFTING    OPEN REDUCTION INTERNAL FIXATION (ORIF) DISTAL RADIAL FRACTURE Left 07/04/2015   Procedure: OPEN REDUCTION INTERNAL FIXATION (ORIF) LEFT DISTAL RADIAL FRACTURE WITH ALLOGRAFT BONE GRAFTING;  Surgeon: Elsie Mussel, MD;  Location[facility];  Service: Orthopedics;  Laterality: Left;   STRABISMUS SURGERY Bilateral    4 on the left; 1 on right   TUBAL LIGATION      No current facility-administered medications for this encounter.   Current Outpatient Medications  Medication Sig Dispense Refill Last Dose/Taking   acidophilus (RISAQUAD) CAPS capsule Take 1 capsule by mouth daily.   Taking   amLODipine  (NORVASC ) 10 MG tablet Take 10 mg by mouth daily.   Taking  amphetamine -dextroamphetamine  (ADDERALL) 30 MG tablet Take 30 mg by mouth 2 (two) times daily.   Taking   Cyanocobalamin ([employer]S B12) 5000 MCG CHEW Chew 5,000 mcg by mouth daily.   Taking   denosumab (PROLIA) 60 MG/ML SOSY injection Inject 60 mg into the skin every 6 (six) months.   Taking   ferrous sulfate 325 (65 FE) MG tablet Take 325 mg by mouth daily.   Taking   fluticasone  (FLONASE ) 50 MCG/ACT nasal spray Place 2 sprays into both nostrils daily as needed for  allergies.   Taking As Needed   losartan  (COZAAR ) 100 MG tablet Take 100 mg by mouth daily.   Taking   MAGNESIUM  PO Take 500 mg by mouth daily.   Taking   venlafaxine  XR (EFFEXOR -XR) 75 MG 24 hr capsule Take 1 capsule (75 mg total) by mouth daily for 180 doses. 90 capsule 1 Taking   naltrexone  (DEPADE) 50 MG tablet Take 1 tablet (50 mg total) by mouth daily. (Patient taking differently: Take by mouth daily.) 90 tablet 1    QUEtiapine  (SEROQUEL ) 200 MG tablet Take 1 tablet (200 mg total) by mouth 2 (two) times daily. 60 tablet 2    Allergies[1]  Social History   Tobacco Use   Smoking status: Every Day    Current packs/day: 0.50    Average packs/day: 0.5 packs/day for 45.0 years (22.5 ttl pk-yrs)    Types: Cigarettes   Smokeless tobacco: Never  Substance Use Topics   Alcohol  use: Not Currently    Alcohol /week: 28.0 standard drinks of alcohol     Types: 21 Glasses of wine, 7 Standard drinks or equivalent per week    Family History  Problem Relation Age of Onset   Alzheimer's disease Mother    Breast cancer Mother    Atrial fibrillation Father    Breast cancer Maternal Grandmother      Review of Systems  Constitutional: Negative.   HENT: Negative.    Eyes: Negative.   Respiratory: Negative.    Cardiovascular:        HTN  Gastrointestinal:        GI ulcer  Genitourinary: Negative.   Musculoskeletal:  Positive for arthralgias.       Osteoporosis  Skin: Negative.   Allergic/Immunologic: Negative.   Neurological: Negative.   Hematological: Negative.   Psychiatric/Behavioral:         ADHD    Objective:  Physical Exam Constitutional:      Appearance: Normal appearance. She is normal weight.  HENT:     Head: Normocephalic and atraumatic.     Nose: Nose normal.  Eyes:     Pupils: Pupils are equal, round, and reactive to light.  Cardiovascular:     Pulses: Normal pulses.  Pulmonary:     Effort: Pulmonary effort is normal.  Musculoskeletal:        General: Tenderness  present.     Cervical back: Normal range of motion and neck supple.     Comments: She has medial and lateral joint line tenderness palpation. No ligamentous laxity. Range of motion is 0 degrees to 115 degrees. No effusion.  Skin:    General: Skin is warm and dry.  Neurological:     General: No focal deficit present.     Mental Status: She is alert and oriented to person, place, and time. Mental status is at baseline.  Psychiatric:        Mood and Affect: Mood normal.        Behavior: Behavior  normal.        Thought Content: Thought content normal.        Judgment: Judgment normal.     Vital signs in last 24 hours:    Labs:   Estimated body mass index is 28.17 kg/m as calculated from the following:   Height as of 04/27/24: 5' 2 (1.575 m).   Weight as of 04/27/24: 69.9 kg.   Imaging Review 4 view standing knee on the right side is essentially narrowed to bone-on-bone in the medial compartment of the right knee.      Assessment/Plan:  End stage arthritis, right knee   The patient history, physical examination, clinical judgment of the provider and imaging studies are consistent with end stage degenerative joint disease of the right knee(s) and total knee arthroplasty is deemed medically necessary. The treatment options including medical management, injection therapy arthroscopy and arthroplasty were discussed at length. The risks and benefits of total knee arthroplasty were presented and reviewed. The risks due to aseptic loosening, infection, stiffness, patella tracking problems, thromboembolic complications and other imponderables were discussed. The patient acknowledged the explanation, agreed to proceed with the plan and consent was signed. Patient is being admitted for inpatient treatment for surgery, pain control, PT, OT, prophylactic antibiotics, VTE prophylaxis, progressive ambulation and ADL's and discharge planning. The patient is planning to be discharged home with home  health services     Patient's anticipated LOS is less than 2 midnights, meeting these requirements: - Younger than 41 - Lives within 1 hour of care - Has a competent adult at home to recover with post-op recover - NO history of  - Chronic pain requiring opiods  - Diabetes  - Coronary Artery Disease  - Heart failure  - Heart attack  - Stroke  - DVT/VTE  - Cardiac arrhythmia  - Respiratory Failure/COPD  - Renal failure  - Anemia  - Advanced Liver disease      [1]  Allergies Allergen Reactions   Cefaclor Rash   Cephalosporins Itching

## 2024-05-03 ENCOUNTER — Observation Stay (HOSPITAL_COMMUNITY)
Admission: RE | Admit: 2024-05-03 | Discharge: 2024-05-04 | Disposition: A | Source: Ambulatory Visit | Attending: Orthopedic Surgery | Admitting: Orthopedic Surgery

## 2024-05-03 ENCOUNTER — Ambulatory Visit (HOSPITAL_COMMUNITY): Payer: Self-pay | Admitting: Medical

## 2024-05-03 ENCOUNTER — Encounter: Admission: RE | Payer: Self-pay

## 2024-05-03 ENCOUNTER — Ambulatory Visit (HOSPITAL_COMMUNITY): Admitting: Anesthesiology

## 2024-05-03 ENCOUNTER — Encounter (HOSPITAL_COMMUNITY): Payer: Self-pay | Admitting: Orthopedic Surgery

## 2024-05-03 ENCOUNTER — Other Ambulatory Visit: Payer: Self-pay

## 2024-05-03 DIAGNOSIS — F17210 Nicotine dependence, cigarettes, uncomplicated: Secondary | ICD-10-CM | POA: Diagnosis not present

## 2024-05-03 DIAGNOSIS — I10 Essential (primary) hypertension: Secondary | ICD-10-CM

## 2024-05-03 DIAGNOSIS — F418 Other specified anxiety disorders: Secondary | ICD-10-CM | POA: Diagnosis not present

## 2024-05-03 DIAGNOSIS — M1711 Unilateral primary osteoarthritis, right knee: Principal | ICD-10-CM | POA: Diagnosis present

## 2024-05-03 DIAGNOSIS — Z79899 Other long term (current) drug therapy: Secondary | ICD-10-CM | POA: Insufficient documentation

## 2024-05-03 DIAGNOSIS — Z01818 Encounter for other preprocedural examination: Secondary | ICD-10-CM

## 2024-05-03 DIAGNOSIS — I129 Hypertensive chronic kidney disease with stage 1 through stage 4 chronic kidney disease, or unspecified chronic kidney disease: Secondary | ICD-10-CM | POA: Insufficient documentation

## 2024-05-03 DIAGNOSIS — N189 Chronic kidney disease, unspecified: Secondary | ICD-10-CM | POA: Insufficient documentation

## 2024-05-03 DIAGNOSIS — M25561 Pain in right knee: Secondary | ICD-10-CM | POA: Diagnosis present

## 2024-05-03 DIAGNOSIS — Z96651 Presence of right artificial knee joint: Secondary | ICD-10-CM

## 2024-05-03 MED ORDER — PHENOL 1.4 % MT LIQD: 1.0000 | OROMUCOSAL | Status: DC | PRN

## 2024-05-03 MED ORDER — METHOCARBAMOL 500 MG PO TABS
500.0000 mg | ORAL_TABLET | Freq: Four times a day (QID) | ORAL | Status: DC | PRN
  Administered 2024-05-03 – 2024-05-04 (×3): 500 mg via ORAL
  Filled 2024-05-03 (×2): qty 1

## 2024-05-03 MED ORDER — FENTANYL CITRATE (PF) 100 MCG/2ML IJ SOLN
INTRAMUSCULAR | Status: DC | PRN
  Administered 2024-05-03: 100 ug via INTRAVENOUS

## 2024-05-03 MED ORDER — LACTATED RINGERS IV BOLUS: 250.0000 mL | Freq: Once | INTRAVENOUS | Status: DC

## 2024-05-03 MED ORDER — ORAL CARE MOUTH RINSE: 15.0000 mL | Freq: Once | OROMUCOSAL | Status: DC

## 2024-05-03 MED ORDER — BUPIVACAINE LIPOSOME 1.3 % IJ SUSP
INTRAMUSCULAR | Status: AC
  Filled 2024-05-03: qty 20

## 2024-05-03 MED ORDER — PANTOPRAZOLE SODIUM 40 MG PO TBEC
40.0000 mg | DELAYED_RELEASE_TABLET | Freq: Every day | ORAL | Status: DC
  Administered 2024-05-03 – 2024-05-04 (×2): 40 mg via ORAL
  Filled 2024-05-03 (×2): qty 1

## 2024-05-03 MED ORDER — BUPIVACAINE-EPINEPHRINE (PF) 0.25% -1:200000 IJ SOLN
INTRAMUSCULAR | Status: AC
  Filled 2024-05-03: qty 30

## 2024-05-03 MED ORDER — ONDANSETRON HCL 4 MG/2ML IJ SOLN: 4.0000 mg | Freq: Four times a day (QID) | INTRAMUSCULAR | Status: DC | PRN

## 2024-05-03 MED ORDER — KCL IN DEXTROSE-NACL 20-5-0.45 MEQ/L-%-% IV SOLN
INTRAVENOUS | Status: DC
  Filled 2024-05-03 (×2): qty 1000

## 2024-05-03 MED ORDER — HYDROMORPHONE HCL 1 MG/ML IJ SOLN: 0.5000 mg | INTRAMUSCULAR | Status: DC | PRN

## 2024-05-03 MED ORDER — SODIUM CHLORIDE (PF) 0.9 % IJ SOLN
INTRAMUSCULAR | Status: DC | PRN
  Administered 2024-05-03: 100 mL

## 2024-05-03 MED ORDER — METOCLOPRAMIDE HCL 5 MG/ML IJ SOLN: 5.0000 mg | Freq: Three times a day (TID) | INTRAMUSCULAR | Status: DC | PRN

## 2024-05-03 MED ORDER — LOSARTAN POTASSIUM 50 MG PO TABS
100.0000 mg | ORAL_TABLET | Freq: Every day | ORAL | Status: DC
  Administered 2024-05-04: 100 mg via ORAL
  Filled 2024-05-03: qty 2

## 2024-05-03 MED ORDER — QUETIAPINE FUMARATE 50 MG PO TABS
200.0000 mg | ORAL_TABLET | Freq: Two times a day (BID) | ORAL | Status: DC
  Administered 2024-05-03: 200 mg via ORAL
  Filled 2024-05-03 (×2): qty 4

## 2024-05-03 MED ORDER — LACTATED RINGERS IV SOLN: INTRAVENOUS | Status: DC

## 2024-05-03 MED ORDER — FENTANYL CITRATE (PF) 50 MCG/ML IJ SOSY
PREFILLED_SYRINGE | INTRAMUSCULAR | Status: AC
  Filled 2024-05-03: qty 1

## 2024-05-03 MED ORDER — METOCLOPRAMIDE HCL 5 MG PO TABS: 5.0000 mg | ORAL_TABLET | Freq: Three times a day (TID) | ORAL | Status: DC | PRN

## 2024-05-03 MED ORDER — PROPOFOL 10 MG/ML IV BOLUS
INTRAVENOUS | Status: AC
  Filled 2024-05-03: qty 20

## 2024-05-03 MED ORDER — PROPOFOL 10 MG/ML IV BOLUS
INTRAVENOUS | Status: DC | PRN
  Administered 2024-05-03: 30 mg via INTRAVENOUS
  Administered 2024-05-03: 150 mg via INTRAVENOUS
  Administered 2024-05-03: 30 mg via INTRAVENOUS

## 2024-05-03 MED ORDER — DIPHENHYDRAMINE HCL 12.5 MG/5ML PO ELIX: 12.5000 mg | ORAL_SOLUTION | ORAL | Status: DC | PRN

## 2024-05-03 MED ORDER — PHENYLEPHRINE 80 MCG/ML (10ML) SYRINGE FOR IV PUSH (FOR BLOOD PRESSURE SUPPORT)
PREFILLED_SYRINGE | INTRAVENOUS | Status: DC | PRN
  Administered 2024-05-03 (×4): 160 ug via INTRAVENOUS

## 2024-05-03 MED ORDER — LEVOFLOXACIN IN D5W 750 MG/150ML IV SOLN
INTRAVENOUS | Status: AC
  Filled 2024-05-03: qty 150

## 2024-05-03 MED ORDER — TRANEXAMIC ACID-NACL 1000-0.7 MG/100ML-% IV SOLN: 1000.0000 mg | Freq: Once | INTRAVENOUS | Status: DC

## 2024-05-03 MED ORDER — OXYCODONE HCL 5 MG PO TABS
5.0000 mg | ORAL_TABLET | ORAL | Status: DC | PRN
  Administered 2024-05-03 – 2024-05-04 (×4): 10 mg via ORAL
  Filled 2024-05-03 (×4): qty 2

## 2024-05-03 MED ORDER — AMISULPRIDE (ANTIEMETIC) 5 MG/2ML IV SOLN: 10.0000 mg | Freq: Once | INTRAVENOUS | Status: DC | PRN

## 2024-05-03 MED ORDER — ALUM & MAG HYDROXIDE-SIMETH 200-200-20 MG/5ML PO SUSP: 30.0000 mL | ORAL | Status: DC | PRN

## 2024-05-03 MED ORDER — METHOCARBAMOL 500 MG PO TABS
ORAL_TABLET | ORAL | Status: AC
  Filled 2024-05-03: qty 1

## 2024-05-03 MED ORDER — AMLODIPINE BESYLATE 10 MG PO TABS
10.0000 mg | ORAL_TABLET | Freq: Every day | ORAL | Status: DC
  Administered 2024-05-04: 10 mg via ORAL
  Filled 2024-05-03: qty 1

## 2024-05-03 MED ORDER — BISACODYL 5 MG PO TBEC: 5.0000 mg | DELAYED_RELEASE_TABLET | Freq: Every day | ORAL | Status: DC | PRN

## 2024-05-03 MED ORDER — POVIDONE-IODINE 10 % EX SWAB: 2.0000 | Freq: Once | CUTANEOUS | Status: DC

## 2024-05-03 MED ORDER — FENTANYL CITRATE (PF) 50 MCG/ML IJ SOSY
50.0000 ug | PREFILLED_SYRINGE | INTRAMUSCULAR | Status: AC
  Administered 2024-05-03: 50 ug via INTRAVENOUS
  Filled 2024-05-03: qty 1

## 2024-05-03 MED ORDER — ACETAMINOPHEN 10 MG/ML IV SOLN
INTRAVENOUS | Status: AC
  Filled 2024-05-03: qty 100

## 2024-05-03 MED ORDER — PHENYLEPHRINE 80 MCG/ML (10ML) SYRINGE FOR IV PUSH (FOR BLOOD PRESSURE SUPPORT)
PREFILLED_SYRINGE | INTRAVENOUS | Status: AC
  Filled 2024-05-03: qty 10

## 2024-05-03 MED ORDER — LACTATED RINGERS IV BOLUS
500.0000 mL | Freq: Once | INTRAVENOUS | Status: AC
  Administered 2024-05-03: 500 mL via INTRAVENOUS

## 2024-05-03 MED ORDER — ONDANSETRON HCL 4 MG PO TABS: 4.0000 mg | ORAL_TABLET | Freq: Four times a day (QID) | ORAL | Status: DC | PRN

## 2024-05-03 MED ORDER — SODIUM CHLORIDE (PF) 0.9 % IJ SOLN
INTRAMUSCULAR | Status: AC
  Filled 2024-05-03: qty 50

## 2024-05-03 MED ORDER — FLUTICASONE PROPIONATE 50 MCG/ACT NA SUSP: 2.0000 | Freq: Every day | NASAL | Status: DC | PRN

## 2024-05-03 MED ORDER — AMPHETAMINE-DEXTROAMPHETAMINE 30 MG PO TABS: 30.0000 mg | ORAL_TABLET | Freq: Two times a day (BID) | ORAL | Status: DC

## 2024-05-03 MED ORDER — FERROUS SULFATE 325 (65 FE) MG PO TABS
325.0000 mg | ORAL_TABLET | Freq: Every day | ORAL | Status: DC
  Administered 2024-05-04: 325 mg via ORAL
  Filled 2024-05-03: qty 1

## 2024-05-03 MED ORDER — GLYCOPYRROLATE 0.2 MG/ML IJ SOLN
INTRAMUSCULAR | Status: DC | PRN
  Administered 2024-05-03: .2 mg via INTRAVENOUS

## 2024-05-03 MED ORDER — LEVOFLOXACIN IN D5W 500 MG/100ML IV SOLN
500.0000 mg | INTRAVENOUS | Status: DC
  Filled 2024-05-03: qty 100

## 2024-05-03 MED ORDER — RISAQUAD PO CAPS
1.0000 | ORAL_CAPSULE | Freq: Every day | ORAL | Status: DC
  Filled 2024-05-03: qty 1

## 2024-05-03 MED ORDER — CHLORHEXIDINE GLUCONATE 0.12 % MT SOLN: 15.0000 mL | Freq: Once | OROMUCOSAL | Status: DC

## 2024-05-03 MED ORDER — ONDANSETRON HCL 4 MG/2ML IJ SOLN
INTRAMUSCULAR | Status: DC | PRN
  Administered 2024-05-03: 4 mg via INTRAVENOUS

## 2024-05-03 MED ORDER — VENLAFAXINE HCL ER 75 MG PO CP24
75.0000 mg | ORAL_CAPSULE | Freq: Every day | ORAL | Status: DC
  Administered 2024-05-04: 75 mg via ORAL
  Filled 2024-05-03: qty 1

## 2024-05-03 MED ORDER — CHLORHEXIDINE GLUCONATE 0.12 % MT SOLN
15.0000 mL | Freq: Once | OROMUCOSAL | Status: AC
  Administered 2024-05-03: 15 mL via OROMUCOSAL

## 2024-05-03 MED ORDER — DOCUSATE SODIUM 100 MG PO CAPS
100.0000 mg | ORAL_CAPSULE | Freq: Two times a day (BID) | ORAL | Status: DC
  Filled 2024-05-03 (×2): qty 1

## 2024-05-03 MED ORDER — EPHEDRINE SULFATE (PRESSORS) 25 MG/5ML IV SOSY
PREFILLED_SYRINGE | INTRAVENOUS | Status: DC | PRN
  Administered 2024-05-03: 10 mg via INTRAVENOUS

## 2024-05-03 MED ORDER — LIDOCAINE 2% (20 MG/ML) 5 ML SYRINGE
INTRAMUSCULAR | Status: DC | PRN
  Administered 2024-05-03: 60 mg via INTRAVENOUS

## 2024-05-03 MED ORDER — WATER FOR IRRIGATION, STERILE IR SOLN
Status: DC | PRN
  Administered 2024-05-03: 2000 mL

## 2024-05-03 MED ORDER — OXYCODONE-ACETAMINOPHEN 5-325 MG PO TABS: 1.0000 | ORAL_TABLET | ORAL | 0 refills | Status: AC | PRN

## 2024-05-03 MED ORDER — ACETAMINOPHEN 10 MG/ML IV SOLN
1000.0000 mg | Freq: Once | INTRAVENOUS | Status: DC | PRN
  Administered 2024-05-03: 1000 mg via INTRAVENOUS

## 2024-05-03 MED ORDER — ZOLPIDEM TARTRATE 5 MG PO TABS: 5.0000 mg | ORAL_TABLET | Freq: Every evening | ORAL | Status: DC | PRN

## 2024-05-03 MED ORDER — ACETAMINOPHEN 325 MG PO TABS: 325.0000 mg | ORAL_TABLET | Freq: Four times a day (QID) | ORAL | Status: DC | PRN

## 2024-05-03 MED ORDER — BUPIVACAINE-EPINEPHRINE (PF) 0.5% -1:200000 IJ SOLN
INTRAMUSCULAR | Status: DC | PRN
  Administered 2024-05-03: 30 mL via PERINEURAL

## 2024-05-03 MED ORDER — ASPIRIN 325 MG PO TBEC: 325.0000 mg | DELAYED_RELEASE_TABLET | Freq: Two times a day (BID) | ORAL | 0 refills | Status: AC

## 2024-05-03 MED ORDER — CEFAZOLIN SODIUM-DEXTROSE 2-4 GM/100ML-% IV SOLN
2.0000 g | Freq: Once | INTRAVENOUS | Status: AC
  Administered 2024-05-03: 2 g via INTRAVENOUS

## 2024-05-03 MED ORDER — SODIUM CHLORIDE 0.9 % IR SOLN
Status: DC | PRN
  Administered 2024-05-03: 1000 mL

## 2024-05-03 MED ORDER — ONDANSETRON HCL 4 MG/2ML IJ SOLN: 4.0000 mg | Freq: Once | INTRAMUSCULAR | Status: DC | PRN

## 2024-05-03 MED ORDER — BUPIVACAINE LIPOSOME 1.3 % IJ SUSP: 10.0000 mL | Freq: Once | INTRAMUSCULAR | Status: DC

## 2024-05-03 MED ORDER — POLYETHYLENE GLYCOL 3350 17 G PO PACK: 17.0000 g | PACK | Freq: Every day | ORAL | Status: DC | PRN

## 2024-05-03 MED ORDER — ASPIRIN 325 MG PO TBEC
325.0000 mg | DELAYED_RELEASE_TABLET | Freq: Two times a day (BID) | ORAL | Status: DC
  Administered 2024-05-03 – 2024-05-04 (×2): 325 mg via ORAL
  Filled 2024-05-03 (×2): qty 1

## 2024-05-03 MED ORDER — VANCOMYCIN HCL IN DEXTROSE 1-5 GM/200ML-% IV SOLN
1000.0000 mg | INTRAVENOUS | Status: DC
  Filled 2024-05-03: qty 200

## 2024-05-03 MED ORDER — 0.9 % SODIUM CHLORIDE (POUR BTL) OPTIME
TOPICAL | Status: DC | PRN
  Administered 2024-05-03: 1000 mL

## 2024-05-03 MED ORDER — FENTANYL CITRATE (PF) 50 MCG/ML IJ SOSY
25.0000 ug | PREFILLED_SYRINGE | INTRAMUSCULAR | Status: DC | PRN
  Administered 2024-05-03 (×3): 50 ug via INTRAVENOUS

## 2024-05-03 MED ORDER — FENTANYL CITRATE (PF) 50 MCG/ML IJ SOSY
PREFILLED_SYRINGE | INTRAMUSCULAR | Status: AC
  Filled 2024-05-03: qty 2

## 2024-05-03 MED ORDER — MENTHOL 3 MG MT LOZG: 1.0000 | LOZENGE | OROMUCOSAL | Status: DC | PRN

## 2024-05-03 MED ORDER — METHOCARBAMOL 1000 MG/10ML IJ SOLN: 500.0000 mg | Freq: Four times a day (QID) | INTRAMUSCULAR | Status: DC | PRN

## 2024-05-03 MED ORDER — DOCUSATE SODIUM 100 MG PO CAPS: 100.0000 mg | ORAL_CAPSULE | Freq: Two times a day (BID) | ORAL | 2 refills | Status: AC

## 2024-05-03 MED ORDER — DEXAMETHASONE SOD PHOSPHATE PF 10 MG/ML IJ SOLN
INTRAMUSCULAR | Status: DC | PRN
  Administered 2024-05-03: 10 mg via INTRAVENOUS

## 2024-05-03 MED ORDER — FLEET ENEMA RE ENEM: 1.0000 | ENEMA | Freq: Once | RECTAL | Status: DC | PRN

## 2024-05-03 MED ORDER — TRAMADOL HCL 50 MG PO TABS
50.0000 mg | ORAL_TABLET | Freq: Four times a day (QID) | ORAL | Status: DC
  Administered 2024-05-03 – 2024-05-04 (×3): 50 mg via ORAL
  Filled 2024-05-03 (×4): qty 1

## 2024-05-03 MED ORDER — ORAL CARE MOUTH RINSE: 15.0000 mL | Freq: Once | OROMUCOSAL | Status: AC

## 2024-05-03 MED ORDER — ACETAMINOPHEN 500 MG PO TABS
1000.0000 mg | ORAL_TABLET | Freq: Four times a day (QID) | ORAL | Status: AC
  Administered 2024-05-03 – 2024-05-04 (×4): 1000 mg via ORAL
  Filled 2024-05-03 (×4): qty 2

## 2024-05-03 MED ORDER — MIDAZOLAM HCL (PF) 2 MG/2ML IJ SOLN
2.0000 mg | INTRAMUSCULAR | Status: AC
  Administered 2024-05-03: 2 mg via INTRAVENOUS
  Filled 2024-05-03: qty 2

## 2024-05-03 MED ORDER — TIZANIDINE HCL 2 MG PO TABS: 2.0000 mg | ORAL_TABLET | Freq: Four times a day (QID) | ORAL | 0 refills | Status: AC | PRN

## 2024-05-03 MED ORDER — FENTANYL CITRATE (PF) 250 MCG/5ML IJ SOLN
INTRAMUSCULAR | Status: AC
  Filled 2024-05-03: qty 5

## 2024-05-03 MED ORDER — TRANEXAMIC ACID-NACL 1000-0.7 MG/100ML-% IV SOLN
1000.0000 mg | INTRAVENOUS | Status: AC
  Administered 2024-05-03: 1000 mg via INTRAVENOUS
  Filled 2024-05-03: qty 100

## 2024-05-03 NOTE — Op Note (Signed)
 PATIENT ID:      Tiffany Mueller  MRN:     [id number] DOB/AGE:    06-18-1955 / 69 y.o.       OPERATIVE REPORT   DATE OF PROCEDURE:  05/03/2024      PREOPERATIVE DIAGNOSIS:   RIGHT KNEE DEGENERATIVE JOINT DISEASE      Estimated body mass index is 28.17 kg/m as calculated from the following:   Height as of this encounter: 5' 2 (1.575 m).   Weight as of this encounter: 69.9 kg.                                                       POSTOPERATIVE DIAGNOSIS:   Same                                                                  PROCEDURE:  Procedures: ARTHROPLASTY, KNEE, TOTAL Using DepuyAttune RP implants #5 Femur, #5Tibia, 5 mm Attune RP bearing, 35 Patella    SURGEON: Norleen LITTIE Gavel  ASSISTANT:   Camellia POUR. [employer]   (Present and scrubbed throughout the case, critical for assistance with exposure, retraction, instrumentation, and closure.)        ANESTHESIA: general, 20cc Exparel , 50cc 0.25% Marcaine  EBL: 100 cc FLUID REPLACEMENT: unk cc crystaloid TOURNIQUET: none DRAINS: None TRANEXAMIC ACID : 1gm IV, 2gm topical COMPLICATIONS:  None         INDICATIONS FOR PROCEDURE: The patient has  RIGHT KNEE DEGENERATIVE JOINT DISEASE, mild varus deformities, XR shows bone on bone arthritis, lateral subluxation of tibia. Patient has failed all conservative measures including anti-inflammatory medicines, narcotics, attempts at exercise and weight loss, cortisone injections and viscosupplementation.  Risks and benefits of surgery have been discussed, questions answered.   DESCRIPTION OF PROCEDURE: The patient identified by armband, received  IV antibiotics, in the holding area at [facility]. Patient taken to the operating room, appropriate anesthetic monitors were attached, and general anesthesia was  induced. IV Tranexamic acid  was given. Lateral post and 2 surefoot positioners applied to the table, the lower extremity was then prepped and draped in usual sterile fashion from the  toes to the high thigh. Time-out procedure was performed. Camellia POUR. [facility] PAC, was present and scrubbed throughout the case, critical for assistance with, positioning, exposure, retraction, instrumentation, and closure.The skin and subcutaneous tissue along the incision was injected with 20 cc of a mixture of 20cc Exparel  and 30cc Marcaine  50cc saline solution, using a 21-gauge by 1-1/2 inch needle. We began the operation, with the knee flexed 130 degrees, by making the anterior midline incision starting at handbreadth above the patella going over the patella 1 cm medial to and 4 cm distal to the tibial tubercle. Small bleeders in the skin and the subcutaneous tissue identified and cauterized. Transverse retinaculum was incised and reflected medially and a medial parapatellar arthrotomy was accomplished. the patella was everted and theprepatellar fat pad resected. The superficial medial collateral ligament was then elevated from anterior to posterior along the proximal flare of the tibia and anterior half of the menisci resected. The knee was hyperflexed  exposing bone on bone arthritis. Peripheral and notch osteophytes as well as the cruciate ligaments were then resected. We continued to work our way around posteriorly along the proximal tibia, and externally rotated the tibia subluxing it out from underneath the femur. A McHale PCL retractor was placed through the notch, a lateral Hohmann retractor, and anterolateral small homan retractor placed. We then entered the proximal tibia with the Depuy starter drill in line with the axis of the tibia followed by an intramedullary guide rod and 3 degree posterior slope cutting guide. The tibial cutting guide, was pinned into place allowing resection of 2 mm of bone medially and 10 mm of bone laterally. Satisfied with the tibial resection, we then entered the distal femur 2 mm anterior to the PCL origin with the starter drill, followed by the intramedullary guide rod and  applied the distal femoral cutting guide set at 9 mm, with 5 degrees of valgus. This was pinned along the epicondylar axis. At this point, the distal femoral cut was accomplished without difficulty. We then sized for a #5 femoral component and pinned the chamfer guide in 3 degrees of external rotation. The anterior, posterior, and chamfer cuts were accomplished without difficulty followed by the Attune RP box cutting guide and the box cut. We also removed posterior osteophytes from the posterior femoral condyles. The posterior capsule was injected with Exparel  solution. The knee was brought into full extension. We checked our extension gap and fit a 5 mm trial lollipop. Distracting in extension with a lamina spreader,  bleeders in the posterior capsule, Posterior medial and posterior lateral gutter were cauterized.  The transexamic acid-soaked sponge was then placed in the gap of the knee in extension. The knee was flexed 30. The posterior patella cut was accomplished with the 9.5 mm Attune cutting guide, sized for a 35 mm dome, and the fixation pegs drilled.The knee was then once again hyperflexed exposing the proximal tibia. We sized for a # 5 tibial base plate, applied the smokestack and the conical reamer followed by the the Delta fin keel punch. We then hammered into place the Attune RP trial femoral component, drilled the lugs, inserted a  5 mm trial bearing, trial patellar button, and took the knee through range of motion from 0-130 degrees. Medial and lateral ligamentous stability was checked. No thumb pressure was required for patellar Tracking.  All trial components were removed, mating surfaces irrigated with pulse lavage, and dried with suction and sponges. Exparel  solution was applied to the cancellus bone of the patella distal femur and proximal tibia.  After waiting 30 seconds, the bony surfaces were again, dried with sponges. A double batch of DePuy HV cement was mixed and applied to all bony  metallic mating surfaces except for the posterior condyles of the femur itself. In order, we hammered into place the tibial tray and removed excess cement, the femoral component and removed excess cement. The final Attune RP bearing was inserted, and the knee brought to full extension with compression. The patellar button was clamped into place, and excess cement removed. The knee was held at 30 flexion with compression using the second surefoot, while the cement cured. The wound was irrigated out with normal saline solution pulse lavage. The rest of the Exparel  was injected into the parapatellar arthrotomy, subcutaneous tissues, and periosteal tissues. The parapatellar arthrotomy was closed with running #1 Vicryl suture. The subcutaneous tissue with 3-0 undyed Vicryl suture, and the skin with running 3-0 SQ vicryl. An Aquacil dressing  and Ace wrap were applied. The patient was taken to recovery room without difficulty.   Norleen LITTIE Gavel 05/03/2024, 1:00 PM

## 2024-05-03 NOTE — Interval H&P Note (Signed)
 History and Physical Interval Note:  05/03/2024 8:49 AM  Tiffany Mueller  has presented today for surgery, with the diagnosis of RIGHT KNEE DEGENERATIVE JOINT DISEASE.  The various methods of treatment have been discussed with the patient and family. After consideration of risks, benefits and other options for treatment, the patient has consented to  Procedures: ARTHROPLASTY, KNEE, TOTAL (Right) as a surgical intervention.  The patient's history has been reviewed, patient examined, no change in status, stable for surgery.  I have reviewed the patient's chart and labs.  Questions were answered to the patient's satisfaction.     Tiffany Mueller

## 2024-05-03 NOTE — Progress Notes (Signed)
 Orthopedic Tech Progress Note Patient Details:  Tiffany Mueller 05/20/1955 [id number] Applied bone foam per order.  Ortho Devices Type of Ortho Device: Bone foam zero knee Ortho Device/Splint Location: RLE Ortho Device/Splint Interventions: Ordered, Application, Adjustment   Post Interventions Patient Tolerated: Well Instructions Provided: Adjustment of device, Care of device, Poper ambulation with device  [name] 05/03/2024, 12:33 PM

## 2024-05-03 NOTE — Transfer of Care (Signed)
 Immediate Anesthesia Transfer of Care Note  Patient: Tiffany Mueller  Procedure(s) Performed: ARTHROPLASTY, KNEE, TOTAL (Right: Knee)  Patient Location: PACU  Anesthesia Type:Spinal  Level of Consciousness: sedated, patient cooperative, and responds to stimulation  Airway & Oxygen Therapy: Patient Spontanous Breathing and Patient connected to face mask oxygen  Post-op Assessment: Report given to RN and Post -op Vital signs reviewed and stable  Post vital signs: Reviewed and stable  Last Vitals:  Vitals Value Taken Time  BP    Temp    Pulse    Resp    SpO2      Last Pain:  Vitals:   05/03/24 0757  PainSc: 0-No pain         Complications: No notable events documented.

## 2024-05-03 NOTE — Anesthesia Procedure Notes (Signed)
 Anesthesia Regional Block: Adductor canal block   Pre-Anesthetic Checklist: , timeout performed,  Correct Patient, Correct Site, Correct Laterality,  Correct Procedure,, site marked,  Risks and benefits discussed,  Surgical consent,  Pre-op evaluation,  At surgeon's request and post-op pain management  Laterality: Right  Prep: chloraprep       Needles:  Injection technique: Single-shot  Needle Type: Echogenic Stimulator Needle     Needle Length: 10cm  Needle Gauge: 20     Additional Needles:   Procedures:,,,, ultrasound used (permanent image in chart),,    Narrative:  Start time: 05/03/2024 9:15 AM End time: 05/03/2024 9:25 AM Injection made incrementally with aspirations every 5 mL.  Performed by: Personally  Anesthesiologist: Patrisha Bernardino SQUIBB, MD  Additional Notes: Functioning IV was confirmed and monitors were applied. A time-out was performed. Hand hygiene and sterile gloves were used. The thigh was placed in a frog-leg position and prepped in a sterile fashion. A 20ga Bbraun echogenic stimulator needle was placed using ultrasound guidance.  Negative aspiration and negative test dose prior to incremental administration of local anesthetic. The patient tolerated the procedure well.

## 2024-05-03 NOTE — Anesthesia Procedure Notes (Signed)
 Procedure Name: LMA Insertion Date/Time: 05/03/2024 10:24 AM  Performed by: Cindie Charleen PARAS, CRNAPre-anesthesia Checklist: Patient identified, Emergency Drugs available, Suction available, Patient being monitored and Timeout performed Patient Re-evaluated:Patient Re-evaluated prior to induction Oxygen Delivery Method: Circle system utilized Preoxygenation: Pre-oxygenation with 100% oxygen Induction Type: IV induction Ventilation: Mask ventilation without difficulty LMA: LMA inserted LMA Size: 4.0 Number of attempts: 1 Placement Confirmation: positive ETCO2 and breath sounds checked- equal and bilateral Tube secured with: Tape Dental Injury: Teeth and Oropharynx as per pre-operative assessment

## 2024-05-03 NOTE — Discharge Instructions (Signed)

## 2024-05-03 NOTE — Evaluation (Signed)
 Physical Therapy Evaluation Patient Details Name: Tiffany Mueller MRN: [id number] DOB: Apr 30, 1955 Today's Date: 05/03/2024  History of Present Illness  69 yo female presents to therapy s/p R TKA on 05/03/2024 due to failure of conservative measures. Pt PMH includes but is not limited to: OA, depression, substance of abuse, lumbar stenosis, lumbar radiculopathy, LBP, HTN, gastric bypass, L radial fx s/p ORIF,  HTN, and L carpal tunnel release.  Clinical Impression      Tiffany Mueller is a 69 y.o. female POD 0 s/p R TKA. Patient reports mod I with mobility at baseline. Patient is now limited by functional impairments (see PT problem list below) and requires CGA for bed mobility and mod A for transfers. Patient was able to ambulate 0 feet with RW at time of eval due to R knee instability attributed to slow regression of anesthesia.  Patient instructed in exercise to facilitate ROM and circulation to manage edema. Patient will benefit from continued skilled PT interventions to address impairments and progress towards PLOF. Acute PT will follow to progress mobility and stair training in preparation for safe discharge home with family support.     If plan is discharge home, recommend the following: A little help with walking and/or transfers;A little help with bathing/dressing/bathroom;Assistance with cooking/housework;Assist for transportation;Help with stairs or ramp for entrance   Can travel by private vehicle        Equipment Recommendations None recommended by PT  Recommendations for Other Services       Functional Status Assessment Patient has had a recent decline in their functional status and demonstrates the ability to make significant improvements in function in a reasonable and predictable amount of time.     Precautions / Restrictions Precautions Precautions: Fall;Knee Restrictions Weight Bearing Restrictions Per Provider Order: Yes RLE Weight Bearing Per Provider Order: Weight  bearing as tolerated      Mobility  Bed Mobility Overal bed mobility: Needs Assistance Bed Mobility: Supine to Sit     Supine to sit: Contact guard, HOB elevated     General bed mobility comments: min cues    Transfers Overall transfer level: Needs assistance Equipment used: Rolling walker (2 wheels) Transfers: Bed to chair/wheelchair/BSC, Sit to/from Stand Sit to Stand: Contact guard assist Stand pivot transfers: Mod assist         General transfer comment: min cues for sit to stand with noted R knee instabiltiy with weight shifting and pt requried mod A for safety to complete SPT bed to recliner    Ambulation/Gait               General Gait Details: NT  Stairs            Wheelchair Mobility     Tilt Bed    Modified Rankin (Stroke Patients Only)       Balance Overall balance assessment: Needs assistance Sitting-balance support: Feet supported Sitting balance-Leahy Scale: Good     Standing balance support: Bilateral upper extremity supported, During functional activity, Reliant on assistive device for balance Standing balance-Leahy Scale: Poor                               Pertinent Vitals/Pain Pain Assessment Pain Assessment: 0-10 Pain Score: 0-No pain Pain Location: R knee and LE Pain Intervention(s): Limited activity within patient's tolerance, Monitored during session, Premedicated before session, Repositioned, Ice applied    Home Living Family/patient expects to be discharged to:: Private  residence Living Arrangements: Spouse/significant other Available Help at Discharge: Family Type of Home: House (townhome) Home Access: Stairs to enter Entrance Stairs-Rails: None Entrance Stairs-Number of Steps: 1 Alternate Level Stairs-Number of Steps: 18 Home Layout: Two level Home Equipment: Agricultural Consultant (2 wheels);Cane - single point Additional Comments: sates can stay on first floor with bed and 1/2 bath    Prior Function  Prior Level of Function : Independent/Modified Independent;Driving             Mobility Comments: mod I with use of SPC for all ADLs, self care tasks and IADLs       Extremity/Trunk Assessment        Lower Extremity Assessment Lower Extremity Assessment: RLE deficits/detail RLE Deficits / Details: ankle DF/PF 4/5; SLR > 10 degree lag RLE Sensation: decreased light touch;decreased proprioception    Cervical / Trunk Assessment Cervical / Trunk Assessment: Normal  Communication   Communication Communication: No apparent difficulties    Cognition Arousal: Alert Behavior During Therapy: WFL for tasks assessed/performed   PT - Cognitive impairments: No apparent impairments                         Following commands: Intact       Cueing       General Comments      Exercises Total Joint Exercises Ankle Circles/Pumps: Both, 10 reps, AROM   Assessment/Plan    PT Assessment Patient needs continued PT services  PT Problem List Decreased strength;Decreased range of motion;Decreased activity tolerance;Decreased balance;Decreased mobility;Decreased coordination;Pain       PT Treatment Interventions DME instruction;Gait training;Stair training;Functional mobility training;Therapeutic activities;Therapeutic exercise;Balance training;Neuromuscular re-education;Patient/family education;Modalities    PT Goals (Current goals can be found in the Care Plan section)  Acute Rehab PT Goals Patient Stated Goal: to be able to walk the dog pain free PT Goal Formulation: With patient Time For Goal Achievement: 05/17/24 Potential to Achieve Goals: Good    Frequency 7X/week     Co-evaluation               AM-PAC PT 6 Clicks Mobility  Outcome Measure Help needed turning from your back to your side while in a flat bed without using bedrails?: None Help needed moving from lying on your back to sitting on the side of a flat bed without using bedrails?: A  Little Help needed moving to and from a bed to a chair (including a wheelchair)?: A Little Help needed standing up from a chair using your arms (e.g., wheelchair or bedside chair)?: A Little Help needed to walk in hospital room?: Total Help needed climbing 3-5 steps with a railing? : Total 6 Click Score: 15    End of Session Equipment Utilized During Treatment: Gait belt Activity Tolerance: Patient tolerated treatment well Patient left: in chair;with call bell/phone within reach;with chair alarm set Nurse Communication: Mobility status;Other (comment) (R knee instabiltiy with weight acceptance) PT Visit Diagnosis: Unsteadiness on feet (R26.81);Other abnormalities of gait and mobility (R26.89);Muscle weakness (generalized) (M62.81);Difficulty in walking, not elsewhere classified (R26.2);Pain Pain - Right/Left: Right (no pain at time of eval) Pain - part of body: Knee;Leg    Time: 8291-8273 PT Time Calculation (min) (ACUTE ONLY): 18 min   Charges:   PT Evaluation $PT Eval Low Complexity: 1 Low   PT General Charges $$ ACUTE PT VISIT: 1 Visit         [place], PT Acute Rehab   [place] VEAR Drone 05/03/2024, 5:46 PM

## 2024-05-04 ENCOUNTER — Encounter (HOSPITAL_COMMUNITY): Payer: Self-pay | Admitting: Orthopedic Surgery

## 2024-05-04 DIAGNOSIS — M1711 Unilateral primary osteoarthritis, right knee: Secondary | ICD-10-CM | POA: Diagnosis not present

## 2024-05-04 NOTE — Discharge Summary (Signed)
 " Patient ID: Tiffany Mueller MRN: [id number] DOB/AGE: 70-03-1956 70 y.o.  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 female who was admitted 05/03/2024 for operative treatment ofOsteoarthritis of right knee. Patient has severe unremitting pain that affects sleep, daily activities, and work/hobbies. After pre-op clearance the patient was taken to the operating room on 05/03/2024 and underwent  Procedures: ARTHROPLASTY, KNEE, TOTAL.    Patient was given perioperative antibiotics:  Anti-infectives (From admission, onward)    Start     Dose/Rate Route Frequency Ordered Stop   05/03/24 0900  ceFAZolin  (ANCEF ) IVPB 2g/100 mL premix        2 g 200 mL/hr over 30 Minutes Intravenous  Once 05/03/24 0852 05/03/24 1032   05/03/24 0807  levofloxacin  (LEVAQUIN ) 750 MG/150ML IVPB       Note to Pharmacy: Chauncey Morrison D: cabinet override      05/03/24 0807 05/03/24 2014   05/03/24 0745  levofloxacin  (LEVAQUIN ) IVPB 500 mg  Status:  Discontinued        500 mg 100 mL/hr over 60 Minutes Intravenous On call to O.R. 05/03/24 9260 05/03/24 0851   05/03/24 0745  vancomycin  (VANCOCIN ) IVPB 1000 mg/200 mL premix  Status:   Discontinued        1,000 mg 200 mL/hr over 60 Minutes Intravenous On call to O.R. 05/03/24 9260 05/03/24 0851        Patient was given sequential compression devices, early ambulation, and chemoprophylaxis to prevent DVT.  Inpatient Morphine  Milligram Equivalents Per Day 1/26 - 1/27   Values displayed are in units of MME/Day    Order Start / End Date Yesterday Today    fentaNYL  (SUBLIMAZE ) injection 50-100 mcg 1/26 - 1/26 15 of 15-30 --    fentaNYL  (SUBLIMAZE ) injection 25-50 mcg 1/26 - 1/26 45 of 45-90 --    fentaNYL  (SUBLIMAZE ) injection 1/26 - 1/26 *30 of 30 --    oxyCODONE  (Oxy IR/ROXICODONE ) immediate release tablet 5-10 mg 1/26 - No end date 15 of 22.5-45 15 of 45-90    HYDROmorphone  (DILAUDID ) injection 0.5-1 mg 1/26 - No end date 0 of 30-60 0 of 60-120    traMADol  (ULTRAM ) tablet 50 mg 1/26 - No end date 10 of 5 5 of 20    Daily Totals  * 115 of 147.5-260 20 of 125-230  *One-Step medication     Patient benefited maximally from hospital stay and there were no complications.    Recent vital signs: Patient Vitals for the past 24 hrs:  BP Temp Temp src Pulse Resp SpO2  05/04/24 0548 (!) 107/91 98.8 F (37.1 C) Oral 71 16 98 %  05/04/24 0127 118/67 98.4 F (36.9 C) Oral 64 16 97 %  05/03/24 2040 135/76 99.1 F (37.3 C) Oral 65 15 91 %  05/03/24 1538 (!) 142/68 98.1 F (36.7 C) Oral 62 17 99 %  05/03/24 1438 (!) 142/61 97.8 F (36.6 C) Oral (!) 58 17 95 %  05/03/24 1425 -- 97.7 F (36.5 C) -- (!) 56 -- 94 %  05/03/24 1400 130/63 -- -- 62 12 97 %  05/03/24 1345 129/67 -- -- (!) 57 14 96 %  05/03/24 1330 116/64 -- -- 67 12 95 %  05/03/24 1315 133/67 -- -- (!) 55 14 96 %  05/03/24 1303 -- -- -- (!) 59 17 93 %  05/03/24 1300 121/64 -- -- (!) 57 14 92 %  05/03/24 1247 -- -- -- (!) 59 14 93 %  05/03/24 1245 122/64 -- -- (!) 57 13 94 %  05/03/24 1236 -- -- -- 60 15 93 %  05/03/24 1230 112/61 -- -- 64 12 99 %  05/03/24 1218 118/66 (!) 97.5 F (36.4 C) -- 63 15 100 %   05/03/24 0942 124/61 -- -- 68 16 97 %  05/03/24 0937 (!) 129/57 -- -- 66 16 95 %  05/03/24 0932 129/66 -- -- 70 16 94 %     Recent laboratory studies: No results for input(s): WBC, HGB, HCT, PLT, NA, K, CL, CO2, BUN, CREATININE, GLUCOSE, INR, CALCIUM in the last 72 hours.  Invalid input(s): PT, 2   Discharge Medications:   Allergies as of 05/04/2024       Reactions   Cefaclor Rash   Cephalosporins Itching        Medication List     TAKE these medications    acidophilus Caps capsule Take 1 capsule by mouth daily.   amLODipine  10 MG tablet Commonly known as: NORVASC  Take 10 mg by mouth daily.   amphetamine -dextroamphetamine  30 MG tablet Commonly known as: ADDERALL Take 30 mg by mouth 2 (two) times daily.   aspirin  EC 325 MG tablet Take 1 tablet (325 mg total) by mouth 2 (two) times daily.   [employer] B12 5000 MCG Chew Generic drug: Cyanocobalamin Chew 5,000 mcg by mouth daily.   docusate sodium  100 MG capsule Commonly known as: Colace Take 1 capsule (100 mg total) by mouth 2 (two) times daily.   ferrous sulfate  325 (65 FE) MG tablet Take 325 mg by mouth daily.   fluticasone  50 MCG/ACT nasal spray Commonly known as: FLONASE  Place 2 sprays into both nostrils daily as needed for allergies.   losartan  100 MG tablet Commonly known as: COZAAR  Take 100 mg by mouth daily.   MAGNESIUM  PO Take 500 mg by mouth daily.   naltrexone  50 MG tablet Commonly known as: DEPADE Take 1 tablet (50 mg total) by mouth daily. What changed: how much to take   oxyCODONE -acetaminophen  5-325 MG tablet Commonly known as: PERCOCET/ROXICET Take 1 tablet by mouth every 4 (four) hours as needed for severe pain (pain score 7-10).   Prolia 60 MG/ML Sosy injection Generic drug: denosumab Inject 60 mg into the skin every 6 (six) months.   QUEtiapine  200 MG tablet Commonly known as: SEROQUEL  Take 1 tablet (200 mg total) by mouth 2 (two) times daily.    tiZANidine  2 MG tablet Commonly known as: ZANAFLEX  Take 1 tablet (2 mg total) by mouth every 6 (six) hours as needed.   venlafaxine  XR 75 MG 24 hr capsule Commonly known as: EFFEXOR -XR Take 1 capsule (75 mg  total) by mouth daily for 180 doses.               Durable Medical Equipment  (From admission, onward)           Start     Ordered   05/03/24 1438  DME 3 n 1  Once        05/03/24 1437   05/03/24 1225  DME Walker rolling  Once       Question:  Patient needs a walker to treat with the following condition  Answer:  Status post right knee replacement   05/03/24 1224              Discharge Care Instructions  (From admission, onward)           Start     Ordered   05/04/24 0000  Weight bearing as tolerated        05/04/24 0930   05/03/24 0000  Weight bearing as tolerated        05/03/24 1215            Diagnostic Studies: DG Chest 2 View Result Date: 04/27/2024 CLINICAL DATA:  Preop chest exam.  Acromion right knee surgery. EXAM: DG CHEST 2V COMPARISON:  01/13/2018 FINDINGS: The cardiomediastinal contours are normal. The lungs are clear. Pulmonary vasculature is normal. No consolidation, pleural effusion, or pneumothorax. No acute osseous abnormalities are seen. Dextroscoliotic curvature of the thoracic spine. Mild chronic appearing wedging of midthoracic vertebra IMPRESSION: No active cardiopulmonary disease. Electronically Signed   By: Andrea Gasman M.D.   On: 04/27/2024 17:27    Disposition: Discharge disposition: 01-Home or Self Care       Discharge Instructions     Call MD / Call 911   Complete by: As directed    If you experience chest pain or shortness of breath, CALL 911 and be transported to the hospital emergency room.  If you develope a fever above 101 F, pus (white drainage) or increased drainage or redness at the wound, or calf pain, call your surgeon's office.   Call MD / Call 911   Complete by: As directed    If you experience  chest pain or shortness of breath, CALL 911 and be transported to the hospital emergency room.  If you develope a fever above 101 F, pus (white drainage) or increased drainage or redness at the wound, or calf pain, call your surgeon's office.   Constipation Prevention   Complete by: As directed    Drink plenty of fluids.  Prune juice may be helpful.  You may use a stool softener, such as Colace (over the counter) 100 mg twice a day.  Use MiraLax  (over the counter) for constipation as needed.   Constipation Prevention   Complete by: As directed    Drink plenty of fluids.  Prune juice may be helpful.  You may use a stool softener, such as Colace (over the counter) 100 mg twice a day.  Use MiraLax  (over the counter) for constipation as needed.   Diet - low sodium heart healthy   Complete by: As directed    Driving restrictions   Complete by: As directed    No driving for 2 weeks   Driving restrictions   Complete by: As directed    No driving for 2 weeks   Increase activity slowly as tolerated   Complete by: As directed    Increase activity slowly as tolerated   Complete by: As directed  Patient may shower   Complete by: As directed    You may shower without a dressing once there is no drainage.  Do not wash over the wound.  If drainage remains, cover wound with plastic wrap and then shower.   Patient may shower   Complete by: As directed    You may shower without a dressing once there is no drainage.  Do not wash over the wound.  If drainage remains, cover wound with plastic wrap and then shower.   Post-operative opioid taper instructions:   Complete by: As directed    POST-OPERATIVE OPIOID TAPER INSTRUCTIONS: It is important to wean off of your opioid medication as soon as possible. If you do not need pain medication after your surgery it is ok to stop day one. Opioids include: Codeine, Hydrocodone (Norco, Vicodin), Oxycodone (Percocet, oxycontin ) and hydromorphone  amongst others.  Long  term and even short term use of opiods can cause: Increased pain response Dependence Constipation Depression Respiratory depression And more.  Withdrawal symptoms can include Flu like symptoms Nausea, vomiting And more Techniques to manage these symptoms Hydrate well Eat regular healthy meals Stay active Use relaxation techniques(deep breathing, meditating, yoga) Do Not substitute Alcohol  to help with tapering If you have been on opioids for less than two weeks and do not have pain than it is ok to stop all together.  Plan to wean off of opioids This plan should start within one week post op of your joint replacement. Maintain the same interval or time between taking each dose and first decrease the dose.  Cut the total daily intake of opioids by one tablet each day Next start to increase the time between doses. The last dose that should be eliminated is the evening dose.      Post-operative opioid taper instructions:   Complete by: As directed    POST-OPERATIVE OPIOID TAPER INSTRUCTIONS: It is important to wean off of your opioid medication as soon as possible. If you do not need pain medication after your surgery it is ok to stop day one. Opioids include: Codeine, Hydrocodone (Norco, Vicodin), Oxycodone (Percocet, oxycontin ) and hydromorphone  amongst others.  Long term and even short term use of opiods can cause: Increased pain response Dependence Constipation Depression Respiratory depression And more.  Withdrawal symptoms can include Flu like symptoms Nausea, vomiting And more Techniques to manage these symptoms Hydrate well Eat regular healthy meals Stay active Use relaxation techniques(deep breathing, meditating, yoga) Do Not substitute Alcohol  to help with tapering If you have been on opioids for less than two weeks and do not have pain than it is ok to stop all together.  Plan to wean off of opioids This plan should start within one week post op of your joint  replacement. Maintain the same interval or time between taking each dose and first decrease the dose.  Cut the total daily intake of opioids by one tablet each day Next start to increase the time between doses. The last dose that should be eliminated is the evening dose.      Weight bearing as tolerated   Complete by: As directed    Weight bearing as tolerated   Complete by: As directed         Follow-up Information     Yvone Rush, MD. Go on 05/13/2024.   Specialty: Orthopedic Surgery Why: Your appointment is scheduled for 1015 Contact information: [address] [place] [address] [phone]         [facility], [place]. Go on  05/07/2024.   Why: Your outpatient physical therapy is scheduled for  05/07/24. they will call you with a time. Contact information: Physical Therapy [address] [place] [address] [phone]                  Signed: [name] 05/04/2024, 9:31 AM    "

## 2024-05-04 NOTE — TOC Transition Note (Signed)
 Transition of Care [facility]) - Discharge Note   Patient Details  Name: Tiffany Mueller MRN: [id number] Date of Birth: 08/26/1955  [facility] of [facility] CM/SW Contact:  Alfonse JONELLE Rex, RN Phone Number: 05/04/2024, 2:30 PM   Clinical Narrative:   MOON completed. Patient confirmed she has a PCP appointment scheduled for May 17, 2024, added to AVS     Final next level of care: OP Rehab Barriers to Discharge: No Barriers Identified   Patient Goals and CMS Choice Patient states their goals for this hospitalization and ongoing recovery are:: return home          Discharge Placement                       Discharge Plan and Services Additional resources added to the After Visit Summary for                  DME Arranged: N/A DME Agency: NA                  Social Drivers of Health (SDOH) Interventions SDOH Screenings   Food Insecurity: No Food Insecurity (05/03/2024)  Housing: Low Risk (05/03/2024)  Transportation Needs: No Transportation Needs (05/03/2024)  Utilities: Not At Risk (05/03/2024)  Social Connections: Moderately Isolated (05/03/2024)  Tobacco Use: High Risk (05/03/2024)     Readmission Risk Interventions     No data to display

## 2024-05-04 NOTE — Care Management Obs Status (Signed)
 MEDICARE OBSERVATION STATUS NOTIFICATION   Patient Details  Name: VIANCA BRACHER MRN: [id number] Date of Birth: 11/29/1955   Medicare Observation Status Notification Given:  Yes    Alfonse JONELLE Rex, RN 05/04/2024, 1:56 PM

## 2024-05-04 NOTE — Progress Notes (Signed)
 Physical Therapy Treatment Patient Details Name: Tiffany Mueller MRN: [id number] DOB: 22-May-1955 Today's Date: 05/04/2024   History of Present Illness 69 yo female presents to therapy s/p R TKA on 05/03/2024 due to failure of conservative measures. Pt PMH includes but is not limited to: OA, depression, substance of abuse, lumbar stenosis, lumbar radiculopathy, LBP, HTN, gastric bypass, L radial fx s/p ORIF,  HTN, and L carpal tunnel release.    PT Comments  POD # 1 pm session  Pt feeling well.  Back in bed and had a short nap.  Self able to get OOB.  Assisted with amb in hallway.  General Gait Details: Min VC's on proper walker to self distance. Tolerated well.  Practiced one step/curb twice forward with walker at Mod VC's on proper walker placement and sequencing.  Then returned to room to perform some TE's following HEP handout.  Instructed on proper tech, freq as well as use of ICE.   Addressed all mobility questions, discussed appropriate activity, educated on use of ICE.   Pt ready for D/C to home.  Reported to Nurse.    If plan is discharge home, recommend the following: A little help with walking and/or transfers;A little help with bathing/dressing/bathroom;Assistance with cooking/housework;Assist for transportation;Help with stairs or ramp for entrance   Can travel by private vehicle        Equipment Recommendations  None recommended by PT    Recommendations for Other Services       Precautions / Restrictions Precautions Precautions: Fall;Knee Precaution/Restrictions Comments: no pillow under knee Restrictions Weight Bearing Restrictions Per Provider Order: No RLE Weight Bearing Per Provider Order: Weight bearing as tolerated     Mobility  Bed Mobility Overal bed mobility: Modified Independent Bed Mobility: Supine to Sit     Supine to sit: Contact guard, HOB elevated     General bed mobility comments: self able    Transfers Overall transfer level: Needs  assistance Equipment used: Rolling walker (2 wheels) Transfers: Sit to/from Stand Sit to Stand: Supervision, Contact guard assist           General transfer comment: Mod VC's on proper hand plascement and safety with turns.    Ambulation/Gait Ambulation/Gait assistance: Contact guard assist, Supervision Gait Distance (Feet): 44 Feet Assistive device: Rolling walker (2 wheels) Gait Pattern/deviations: Step-through pattern Gait velocity: too fast     General Gait Details: Min VC's on proper walker to self distance. Tolerated well.   Stairs Stairs: Yes Stairs assistance: Contact guard assist, Supervision Stair Management: No rails, Step to pattern, Forwards, With walker Number of Stairs: 1 General stair comments: Mod VC's up/down ONE step/curb with walker.  Practiced three times.   Wheelchair Mobility     Tilt Bed    Modified Rankin (Stroke Patients Only)       Balance                                            Communication Communication Communication: No apparent difficulties  Cognition   Behavior During Therapy: WFL for tasks assessed/performed   PT - Cognitive impairments: Safety/Judgement                       PT - Cognition Comments: AxO x 3 pleasant Lady but a little anxious.  Impulsive.  VC's for safety. Following commands: Intact  Cueing    Exercises  Total Knee Replacement TE's following HEP handout 10 reps B LE ankle pumps 05 reps towel squeezes 05 reps knee presses 05 reps heel slides  05 reps SAQ's 05 reps SLR's 05 reps ABD Educated on use of gait belt to assist with TE's Followed by ICE     General Comments        Pertinent Vitals/Pain Pain Assessment Pain Assessment: 0-10 Pain Score: 7  Pain Location: R knee and LE Pain Descriptors / Indicators: Operative site guarding, Grimacing Pain Intervention(s): Monitored during session, Premedicated before session, Repositioned, Patient requesting pain  meds-RN notified, Ice applied    Home Living                          Prior Function            PT Goals (current goals can now be found in the care plan section) Progress towards PT goals: Progressing toward goals    Frequency    7X/week      PT Plan      Co-evaluation              AM-PAC PT 6 Clicks Mobility   Outcome Measure  Help needed turning from your back to your side while in a flat bed without using bedrails?: A Little Help needed moving from lying on your back to sitting on the side of a flat bed without using bedrails?: A Little Help needed moving to and from a bed to a chair (including a wheelchair)?: A Little Help needed standing up from a chair using your arms (e.g., wheelchair or bedside chair)?: A Little Help needed to walk in hospital room?: A Lot Help needed climbing 3-5 steps with a railing? : A Lot 6 Click Score: 16    End of Session Equipment Utilized During Treatment: Gait belt Activity Tolerance: Patient limited by pain Patient left: in chair;with call bell/phone within reach;with chair alarm set Nurse Communication: Mobility status (Pt will need another PT session and expected to D/C to home later today.) PT Visit Diagnosis: Unsteadiness on feet (R26.81);Other abnormalities of gait and mobility (R26.89);Muscle weakness (generalized) (M62.81);Difficulty in walking, not elsewhere classified (R26.2);Pain Pain - Right/Left: Right Pain - part of body: Knee     Time: 1412-1440 PT Time Calculation (min) (ACUTE ONLY): 28 min  Charges:    $Gait Training: 8-22 mins $Therapeutic Exercise: 8-22 mins PT General Charges $$ ACUTE PT VISIT: 1 Visit                     Katheryn Leap  PTA Acute  Rehabilitation Services Office M-F          [phone]

## 2024-05-04 NOTE — Anesthesia Postprocedure Evaluation (Signed)
"   Anesthesia Post Note  Patient: Tiffany Mueller  Procedure(s) Performed: ARTHROPLASTY, KNEE, TOTAL (Right: Knee)     Patient location during evaluation: PACU Anesthesia Type: Regional and General Level of consciousness: awake Pain management: pain level controlled Vital Signs Assessment: post-procedure vital signs reviewed and stable Respiratory status: spontaneous breathing, nonlabored ventilation and respiratory function stable Cardiovascular status: blood pressure returned to baseline and stable Postop Assessment: no apparent nausea or vomiting Anesthetic complications: no   No notable events documented.  Last Vitals:  Vitals:   05/04/24 0127 05/04/24 0548  BP: 118/67 (!) 107/91  Pulse: 64 71  Resp: 16 16  Temp: 36.9 C 37.1 C  SpO2: 97% 98%    Last Pain:  Vitals:   05/04/24 0548  TempSrc: Oral  PainSc:                  Bandon Sherwin P Emre Stock      "

## 2024-05-04 NOTE — TOC Transition Note (Signed)
 Transition of Care [facility]) - Discharge Note   Patient Details  Name: Tiffany Mueller MRN: [id number] Date of Birth: 01/05/1956  [facility] of [facility] CM/SW Contact:  NORMAN ASPEN, LCSW Phone Number: 05/04/2024, 10:59 AM   Clinical Narrative:     Met with pt who confirms she has needed DME in the home.  OPPT already arranged at [facility].  No further IP CM needs.  Final next level of care: OP Rehab Barriers to Discharge: No Barriers Identified   Patient Goals and CMS Choice Patient states their goals for this hospitalization and ongoing recovery are:: return home          Discharge Placement                       Discharge Plan and Services Additional resources added to the After Visit Summary for                  DME Arranged: N/A DME Agency: NA                  Social Drivers of Health (SDOH) Interventions SDOH Screenings   Food Insecurity: No Food Insecurity (05/03/2024)  Housing: Low Risk (05/03/2024)  Transportation Needs: No Transportation Needs (05/03/2024)  Utilities: Not At Risk (05/03/2024)  Social Connections: Moderately Isolated (05/03/2024)  Tobacco Use: High Risk (05/03/2024)     Readmission Risk Interventions     No data to display

## 2024-05-04 NOTE — Progress Notes (Signed)
 Physical Therapy Treatment Patient Details Name: Tiffany Mueller MRN: [id number] DOB: [birth year] Today's Date: 05/04/2024   History of Present Illness 69 yo female presents to therapy s/p R TKA on 05/03/2024 due to failure of conservative measures. Pt PMH includes but is not limited to: OA, depression, substance of abuse, lumbar stenosis, lumbar radiculopathy, LBP, HTN, gastric bypass, L radial fx s/p ORIF,  HTN, and L carpal tunnel release.    PT Comments  POD # 1 am session Pt AxO x 3 pleasant Lady.  Did require Max VC's for safety with turns and proper walker use.  Pt also impulsive and anxious but able to Educate/correct.   Ortho PA visit during session.   Assisted OOB to amb.  General bed mobility comments: demonstarted and instructed how to use belt to self assist LE  General transfer comment: Max VC's on proper hand placement and safety with turns.  Max VC's turn completion prior to sit.  Pt Impulsive.  Too quick.  VC's to slow down and be safe with all her mobility. General Gait Details: MAX VC's to decreased gait speed, proper walker to self distance, safety with thurns and proper stride length to ensure knee stability.  Pt impulsive.  Too quick.  HIGH FALL RISK. Pain post session increased to 8/10.  Additional pain meds requested.  Positioned in recliner to comfort and applied ICE. Pt will have another PT session and expected to D/C to home later today.    If plan is discharge home, recommend the following: A little help with walking and/or transfers;A little help with bathing/dressing/bathroom;Assistance with cooking/housework;Assist for transportation;Help with stairs or ramp for entrance   Can travel by private vehicle        Equipment Recommendations  None recommended by PT    Recommendations for Other Services       Precautions / Restrictions Precautions Precautions: Fall;Knee Precaution/Restrictions Comments: no pillow under knee Restrictions Weight Bearing  Restrictions Per Provider Order: No RLE Weight Bearing Per Provider Order: Weight bearing as tolerated     Mobility  Bed Mobility Overal bed mobility: Needs Assistance Bed Mobility: Supine to Sit     Supine to sit: Contact guard, HOB elevated     General bed mobility comments: demonstarted and instructed how to use belt to self assist LE    Transfers Overall transfer level: Needs assistance Equipment used: Rolling walker (2 wheels) Transfers: Sit to/from Stand Sit to Stand: Contact guard assist, Supervision           General transfer comment: Max VC's on proper hand placement and safety with turns.  Max VC's turn completion prior to sit.  Pt Impulsive.  Too quick.  VC's to slow down and be safe with all her mobility.    Ambulation/Gait Ambulation/Gait assistance: Contact guard assist, Supervision Gait Distance (Feet): 43 Feet Assistive device: Rolling walker (2 wheels) Gait Pattern/deviations: Step-through pattern Gait velocity: too fast     General Gait Details: MAX VC's to decreased gait speed, proper walker to self distance, safety with thurns and proper stride length to ensure knee stability.  Pt impulsive.  Too quick.  HIGH FALL RISK.   Stairs             Wheelchair Mobility     Tilt Bed    Modified Rankin (Stroke Patients Only)       Balance  Communication Communication Communication: No apparent difficulties  Cognition   Behavior During Therapy: WFL for tasks assessed/performed   PT - Cognitive impairments: Safety/Judgement                       PT - Cognition Comments: AxO x 3 but very anxoius today.   Cueing    Exercises      General Comments        Pertinent Vitals/Pain Pain Assessment Pain Assessment: 0-10 Pain Score: 7  Pain Location: R knee and LE Pain Descriptors / Indicators: Operative site guarding, Grimacing Pain Intervention(s): Monitored  during session, Premedicated before session, Repositioned, Patient requesting pain meds-RN notified, Ice applied    Home Living                          Prior Function            PT Goals (current goals can now be found in the care plan section) Progress towards PT goals: Progressing toward goals    Frequency    7X/week      PT Plan      Co-evaluation              AM-PAC PT 6 Clicks Mobility   Outcome Measure  Help needed turning from your back to your side while in a flat bed without using bedrails?: A Little Help needed moving from lying on your back to sitting on the side of a flat bed without using bedrails?: A Little Help needed moving to and from a bed to a chair (including a wheelchair)?: A Little Help needed standing up from a chair using your arms (e.g., wheelchair or bedside chair)?: A Little Help needed to walk in hospital room?: A Lot Help needed climbing 3-5 steps with a railing? : A Lot 6 Click Score: 16    End of Session Equipment Utilized During Treatment: Gait belt Activity Tolerance: Patient limited by pain Patient left: in chair;with call bell/phone within reach;with chair alarm set Nurse Communication: Mobility status (Pt will need another PT session and expected to D/C to home later today.) PT Visit Diagnosis: Unsteadiness on feet (R26.81);Other abnormalities of gait and mobility (R26.89);Muscle weakness (generalized) (M62.81);Difficulty in walking, not elsewhere classified (R26.2);Pain Pain - Right/Left: Right Pain - part of body: Knee     Time: 0912-0950 PT Time Calculation (min) (ACUTE ONLY): 38 min  Charges:    $Gait Training: 8-22 mins $Therapeutic Exercise: 8-22 mins $Therapeutic Activity: 8-22 mins PT General Charges $$ ACUTE PT VISIT: 1 Visit                     Katheryn Leap  PTA Acute  Rehabilitation Services Office M-F          [phone]

## 2024-05-04 NOTE — Progress Notes (Signed)
 PATIENT ID: Tiffany Mueller  MRN: [id number]  DOB/AGE:  1956-01-16 / 69 y.o.  1 Day Post-Op Procedures (LRB): ARTHROPLASTY, KNEE, TOTAL (Right)    PROGRESS NOTE Subjective: Patient is alert, oriented, no Nausea, no Vomiting, yes passing gas. Taking PO well. Denies SOB, Chest or Calf Pain. Using Incentive Spirometer, PAS in place. Ambulate WBAT with pt up with therapy, Yesterday's total administered Morphine  Milligram Equivalents: 115), Patient reports pain as mild .    Objective: Vital signs in last 24 hours: Vitals:   05/03/24 1538 05/03/24 2040 05/04/24 0127 05/04/24 0548  BP: (!) 142/68 135/76 118/67 (!) 107/91  Pulse: 62 65 64 71  Resp: 17 15 16 16   Temp: 98.1 F (36.7 C) 99.1 F (37.3 C) 98.4 F (36.9 C) 98.8 F (37.1 C)  TempSrc: Oral Oral Oral Oral  SpO2: 99% 91% 97% 98%  Weight:      Height:          Intake/Output from previous day: I/O last 3 completed shifts: In: 2582.3 [P.O.:360; I.V.:2122.3; IV Piggyback:100] Out: 175 [Urine:150; Blood:25]   Intake/Output this shift: No intake/output data recorded.   LABORATORY DATA: No results for input(s): WBC, HGB, HCT, PLT, NA, K, CL, CO2, BUN, CREATININE, GLUCOSE, GLUCAP, INR, CALCIUM in the last 72 hours.  Invalid input(s): PT, 2  Examination: Neurologically intact Neurovascular intact Sensation intact distally Intact pulses distally Dorsiflexion/Plantar flexion intact Incision: dressing C/D/I and no drainage No cellulitis present Compartment soft}  Assessment:   1 Day Post-Op Procedures (LRB): ARTHROPLASTY, KNEE, TOTAL (Right) ADDITIONAL DIAGNOSIS: Expected Acute Blood Loss Anemia, Depression, HTN  Patient's anticipated LOS is less than 2 midnights, meeting these requirements: - Younger than 41 - Lives within 1 hour of care - Has a competent adult at home to recover with post-op recover - NO history of  - Chronic pain requiring opiods  - Diabetes  - Coronary Artery  Disease  - Heart failure  - Heart attack  - Stroke  - DVT/VTE  - Cardiac arrhythmia  - Respiratory Failure/COPD  - Renal failure  - Anemia  - Advanced Liver disease     Plan: PT/OT WBAT, AROM and PROM  DVT Prophylaxis:  SCDx72hrs, ASA 325 mg BID x 2 weeks DISCHARGE PLAN: Home DISCHARGE NEEDS: Walker and 3-in-1 comode seat     Tiffany Mueller 05/04/2024, 9:28 AM

## 2024-05-27 ENCOUNTER — Telehealth (HOSPITAL_COMMUNITY): Admitting: Medical
